# Patient Record
Sex: Female | Born: 1940
Health system: Southern US, Community
[De-identification: ages and names within clinical notes are randomized; demographics above are authoritative.]

## PROBLEM LIST (undated history)

## (undated) DIAGNOSIS — K5730 Diverticulosis of large intestine without perforation or abscess without bleeding: Secondary | ICD-10-CM

## (undated) DIAGNOSIS — M129 Arthropathy, unspecified: Secondary | ICD-10-CM

## (undated) DIAGNOSIS — E785 Hyperlipidemia, unspecified: Secondary | ICD-10-CM

## (undated) DIAGNOSIS — R768 Other specified abnormal immunological findings in serum: Secondary | ICD-10-CM

## (undated) DIAGNOSIS — T7840XA Allergy, unspecified, initial encounter: Secondary | ICD-10-CM

## (undated) DIAGNOSIS — M949 Disorder of cartilage, unspecified: Secondary | ICD-10-CM

## (undated) DIAGNOSIS — J309 Allergic rhinitis, unspecified: Secondary | ICD-10-CM

## (undated) DIAGNOSIS — H8109 Meniere's disease, unspecified ear: Secondary | ICD-10-CM

## (undated) DIAGNOSIS — B009 Herpesviral infection, unspecified: Secondary | ICD-10-CM

## (undated) DIAGNOSIS — E039 Hypothyroidism, unspecified: Secondary | ICD-10-CM

## (undated) DIAGNOSIS — M1990 Unspecified osteoarthritis, unspecified site: Secondary | ICD-10-CM

## (undated) DIAGNOSIS — M899 Disorder of bone, unspecified: Secondary | ICD-10-CM

## (undated) DIAGNOSIS — G43909 Migraine, unspecified, not intractable, without status migrainosus: Secondary | ICD-10-CM

## (undated) HISTORY — DX: Allergic rhinitis, unspecified: J30.9

## (undated) HISTORY — PX: WISDOM TOOTH EXTRACTION: SHX21

## (undated) HISTORY — DX: Hypothyroidism, unspecified: E03.9

## (undated) HISTORY — DX: Unspecified osteoarthritis, unspecified site: M19.90

## (undated) HISTORY — PX: COLONOSCOPY: SHX174

## (undated) HISTORY — PX: BUNIONECTOMY: SHX129

## (undated) HISTORY — DX: Other specified abnormal immunological findings in serum: R76.8

## (undated) HISTORY — DX: Meniere's disease, unspecified ear: H81.09

## (undated) HISTORY — DX: Disorder of bone, unspecified: M94.9

## (undated) HISTORY — DX: Arthropathy, unspecified: M12.9

## (undated) HISTORY — DX: Diverticulosis of large intestine without perforation or abscess without bleeding: K57.30

## (undated) HISTORY — DX: Herpesviral infection, unspecified: B00.9

## (undated) HISTORY — DX: Migraine, unspecified, not intractable, without status migrainosus: G43.909

## (undated) HISTORY — DX: Disorder of bone, unspecified: M89.9

## (undated) HISTORY — DX: Allergy, unspecified, initial encounter: T78.40XA

## (undated) HISTORY — DX: Hyperlipidemia, unspecified: E78.5

---
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
## 1998-05-09 ENCOUNTER — Other Ambulatory Visit: Admission: RE | Admit: 1998-05-09 | Discharge: 1998-05-09 | Payer: Self-pay | Admitting: Cardiology

## 1999-04-25 ENCOUNTER — Other Ambulatory Visit: Admission: RE | Admit: 1999-04-25 | Discharge: 1999-04-25 | Payer: Self-pay | Admitting: Cardiology

## 1999-08-13 ENCOUNTER — Encounter: Payer: Self-pay | Admitting: Cardiology

## 1999-08-13 ENCOUNTER — Encounter: Admission: RE | Admit: 1999-08-13 | Discharge: 1999-08-13 | Payer: Self-pay | Admitting: Cardiology

## 2000-06-11 ENCOUNTER — Other Ambulatory Visit: Admission: RE | Admit: 2000-06-11 | Discharge: 2000-06-11 | Payer: Self-pay | Admitting: Urology

## 2000-06-23 ENCOUNTER — Encounter: Payer: Self-pay | Admitting: *Deleted

## 2000-06-23 ENCOUNTER — Encounter: Admission: RE | Admit: 2000-06-23 | Discharge: 2000-06-23 | Payer: Self-pay | Admitting: *Deleted

## 2000-06-27 ENCOUNTER — Other Ambulatory Visit: Admission: RE | Admit: 2000-06-27 | Discharge: 2000-06-27 | Payer: Self-pay | Admitting: Obstetrics & Gynecology

## 2000-06-30 ENCOUNTER — Encounter (INDEPENDENT_AMBULATORY_CARE_PROVIDER_SITE_OTHER): Payer: Self-pay

## 2000-06-30 ENCOUNTER — Other Ambulatory Visit: Admission: RE | Admit: 2000-06-30 | Discharge: 2000-06-30 | Payer: Self-pay | Admitting: Obstetrics & Gynecology

## 2000-08-13 ENCOUNTER — Encounter: Payer: Self-pay | Admitting: *Deleted

## 2000-08-13 ENCOUNTER — Encounter: Admission: RE | Admit: 2000-08-13 | Discharge: 2000-08-13 | Payer: Self-pay | Admitting: *Deleted

## 2000-09-02 ENCOUNTER — Encounter: Payer: Self-pay | Admitting: Internal Medicine

## 2000-09-02 ENCOUNTER — Ambulatory Visit (HOSPITAL_COMMUNITY): Admission: RE | Admit: 2000-09-02 | Discharge: 2000-09-02 | Payer: Self-pay | Admitting: Internal Medicine

## 2000-10-15 ENCOUNTER — Other Ambulatory Visit: Admission: RE | Admit: 2000-10-15 | Discharge: 2000-10-15 | Payer: Self-pay | Admitting: Obstetrics & Gynecology

## 2001-03-03 ENCOUNTER — Encounter: Payer: Self-pay | Admitting: *Deleted

## 2001-03-03 ENCOUNTER — Encounter: Admission: RE | Admit: 2001-03-03 | Discharge: 2001-03-03 | Payer: Self-pay | Admitting: *Deleted

## 2001-07-15 ENCOUNTER — Other Ambulatory Visit: Admission: RE | Admit: 2001-07-15 | Discharge: 2001-07-15 | Payer: Self-pay | Admitting: Obstetrics & Gynecology

## 2001-08-17 ENCOUNTER — Encounter: Payer: Self-pay | Admitting: *Deleted

## 2001-08-17 ENCOUNTER — Encounter: Admission: RE | Admit: 2001-08-17 | Discharge: 2001-08-17 | Payer: Self-pay | Admitting: *Deleted

## 2002-07-20 ENCOUNTER — Other Ambulatory Visit: Admission: RE | Admit: 2002-07-20 | Discharge: 2002-07-20 | Payer: Self-pay | Admitting: Obstetrics & Gynecology

## 2002-09-08 ENCOUNTER — Encounter: Admission: RE | Admit: 2002-09-08 | Discharge: 2002-09-08 | Payer: Self-pay

## 2003-07-25 ENCOUNTER — Other Ambulatory Visit: Admission: RE | Admit: 2003-07-25 | Discharge: 2003-07-25 | Payer: Self-pay | Admitting: Obstetrics & Gynecology

## 2003-09-12 ENCOUNTER — Encounter: Admission: RE | Admit: 2003-09-12 | Discharge: 2003-09-12 | Payer: Self-pay | Admitting: Family Medicine

## 2004-09-13 ENCOUNTER — Encounter: Admission: RE | Admit: 2004-09-13 | Discharge: 2004-09-13 | Payer: Self-pay | Admitting: Family Medicine

## 2005-07-26 ENCOUNTER — Encounter: Admission: RE | Admit: 2005-07-26 | Discharge: 2005-07-26 | Payer: Self-pay | Admitting: Family Medicine

## 2005-07-30 ENCOUNTER — Ambulatory Visit: Payer: Self-pay | Admitting: Internal Medicine

## 2005-08-03 ENCOUNTER — Encounter: Admission: RE | Admit: 2005-08-03 | Discharge: 2005-08-03 | Payer: Self-pay | Admitting: Family Medicine

## 2005-08-09 ENCOUNTER — Ambulatory Visit: Payer: Self-pay | Admitting: Internal Medicine

## 2005-10-08 ENCOUNTER — Encounter: Admission: RE | Admit: 2005-10-08 | Discharge: 2005-10-08 | Payer: Self-pay | Admitting: Obstetrics & Gynecology

## 2006-10-10 ENCOUNTER — Encounter: Admission: RE | Admit: 2006-10-10 | Discharge: 2006-10-10 | Payer: Self-pay | Admitting: Family Medicine

## 2006-12-11 ENCOUNTER — Ambulatory Visit: Payer: Self-pay | Admitting: *Deleted

## 2007-02-22 ENCOUNTER — Encounter: Admission: RE | Admit: 2007-02-22 | Discharge: 2007-02-22 | Payer: Self-pay | Admitting: Otolaryngology

## 2007-03-27 ENCOUNTER — Encounter: Admission: RE | Admit: 2007-03-27 | Discharge: 2007-06-25 | Payer: Self-pay | Admitting: Otolaryngology

## 2007-10-14 ENCOUNTER — Encounter: Admission: RE | Admit: 2007-10-14 | Discharge: 2007-10-14 | Payer: Self-pay | Admitting: Family Medicine

## 2008-09-07 ENCOUNTER — Encounter: Payer: Self-pay | Admitting: Internal Medicine

## 2008-09-07 LAB — CONVERTED CEMR LAB
ALT: 18 units/L
AST: 22 units/L
BUN: 21 mg/dL
CO2: 29 meq/L
Chloride: 101 meq/L
Cholesterol: 165 mg/dL
HCT: 40.4 %
HDL: 61 mg/dL
Hemoglobin: 13.6 g/dL
LDL Cholesterol: 93 mg/dL
MCV: 93 fL
Monocytes Relative: 13 %
Potassium: 3.9 meq/L
RBC: 4.33 M/uL
RDW: 12.9 %
Total Bilirubin: 0.4 mg/dL
Triglyceride fasting, serum: 56 mg/dL

## 2008-09-14 ENCOUNTER — Encounter: Payer: Self-pay | Admitting: Internal Medicine

## 2008-10-06 HISTORY — PX: OTHER SURGICAL HISTORY: SHX169

## 2008-10-13 ENCOUNTER — Ambulatory Visit (HOSPITAL_COMMUNITY): Admission: RE | Admit: 2008-10-13 | Discharge: 2008-10-13 | Payer: Self-pay | Admitting: Otolaryngology

## 2008-10-20 ENCOUNTER — Encounter: Admission: RE | Admit: 2008-10-20 | Discharge: 2008-10-20 | Payer: Self-pay | Admitting: Family Medicine

## 2008-12-15 ENCOUNTER — Encounter: Payer: Self-pay | Admitting: Internal Medicine

## 2008-12-15 LAB — CONVERTED CEMR LAB: TSH: 4.27 microintl units/mL

## 2009-08-22 ENCOUNTER — Encounter: Admission: RE | Admit: 2009-08-22 | Discharge: 2009-08-22 | Payer: Self-pay | Admitting: Family Medicine

## 2009-09-11 ENCOUNTER — Encounter: Payer: Self-pay | Admitting: Internal Medicine

## 2009-09-11 LAB — CONVERTED CEMR LAB
AST: 26 units/L
Alkaline Phosphatase: 58 units/L
BUN: 17 mg/dL
Calcium: 9.8 mg/dL
Chloride: 101 meq/L
Cholesterol: 205 mg/dL
Creatinine, Ser: 0.9 mg/dL
HCT: 41.9 %
MCV: 92 fL
Monocytes Relative: 7 %
Neutrophils Relative %: 54 %
Potassium: 3.9 meq/L
RDW: 13.1 %
TSH: 5.89 microintl units/mL
Total Bilirubin: 0.5 mg/dL

## 2009-10-23 ENCOUNTER — Encounter: Admission: RE | Admit: 2009-10-23 | Discharge: 2009-10-23 | Payer: Self-pay | Admitting: Family Medicine

## 2010-01-11 ENCOUNTER — Encounter: Payer: Self-pay | Admitting: Internal Medicine

## 2010-01-11 LAB — CONVERTED CEMR LAB
ALT: 25 units/L
Alkaline Phosphatase: 55 units/L
BUN: 23 mg/dL
Chloride: 97 meq/L
Creatinine, Ser: 1 mg/dL
Glucose, Bld: 92 mg/dL
HDL: 69 mg/dL
TSH: 5.28 microintl units/mL
Total Bilirubin: 0.7 mg/dL
Total Protein: 7.1 g/dL
Triglyceride fasting, serum: 89 mg/dL

## 2010-07-05 ENCOUNTER — Encounter: Payer: Self-pay | Admitting: Internal Medicine

## 2010-07-05 DIAGNOSIS — J309 Allergic rhinitis, unspecified: Secondary | ICD-10-CM | POA: Insufficient documentation

## 2010-07-05 DIAGNOSIS — M8580 Other specified disorders of bone density and structure, unspecified site: Secondary | ICD-10-CM

## 2010-07-05 DIAGNOSIS — T148XXA Other injury of unspecified body region, initial encounter: Secondary | ICD-10-CM | POA: Insufficient documentation

## 2010-07-05 DIAGNOSIS — K5730 Diverticulosis of large intestine without perforation or abscess without bleeding: Secondary | ICD-10-CM | POA: Insufficient documentation

## 2010-07-05 DIAGNOSIS — M810 Age-related osteoporosis without current pathological fracture: Secondary | ICD-10-CM | POA: Insufficient documentation

## 2010-07-26 ENCOUNTER — Ambulatory Visit
Admission: RE | Admit: 2010-07-26 | Discharge: 2010-07-26 | Payer: Self-pay | Source: Home / Self Care | Attending: Internal Medicine | Admitting: Internal Medicine

## 2010-07-26 ENCOUNTER — Encounter: Payer: Self-pay | Admitting: Internal Medicine

## 2010-07-26 DIAGNOSIS — H8109 Meniere's disease, unspecified ear: Secondary | ICD-10-CM | POA: Insufficient documentation

## 2010-07-26 DIAGNOSIS — E785 Hyperlipidemia, unspecified: Secondary | ICD-10-CM | POA: Insufficient documentation

## 2010-07-26 DIAGNOSIS — G43909 Migraine, unspecified, not intractable, without status migrainosus: Secondary | ICD-10-CM | POA: Insufficient documentation

## 2010-07-26 DIAGNOSIS — M129 Arthropathy, unspecified: Secondary | ICD-10-CM | POA: Insufficient documentation

## 2010-07-26 DIAGNOSIS — E039 Hypothyroidism, unspecified: Secondary | ICD-10-CM | POA: Insufficient documentation

## 2010-08-03 ENCOUNTER — Encounter: Payer: Self-pay | Admitting: Internal Medicine

## 2010-08-09 NOTE — Progress Notes (Signed)
Summary: Summary from patient  Summary from patient   Imported By: Lester [place] 07/27/2010 10:35:48  _____________________________________________________________________  External Attachment:    Type:   Image     Comment:   External Document

## 2010-08-09 NOTE — Letter (Signed)
Summary: Ursula Beath MD/[facility]  Ursula Beath MD/[facility]   Imported By: Lester [place] 07/27/2010 10:34:21  _____________________________________________________________________  External Attachment:    Type:   Image     Comment:   External Document

## 2010-08-09 NOTE — Letter (Signed)
Summary: Colonoscopy Letter  [facility]  [address] [place], [place] [address]   Phone: [phone]  Fax: [phone]      August 03, 2010 MRN: [id number]   ALYANNA STOERMER [address] [place], [place]  [address]   Dear Ms. Depuy,   According to your medical record, it is time for you to schedule a Colonoscopy. The American Cancer Society recommends this procedure as a method to detect early colon cancer. Patients with a family history of colon cancer, or a personal history of colon polyps or inflammatory bowel disease are at increased risk.  This letter has been generated based on the recommendations made at the time of your procedure. If you feel that in your particular situation this may no longer apply, please contact our office.  Please call our office at [phone] to schedule this appointment or to update your records at your earliest convenience.  Thank you for cooperating with [place] to provide you with the very best care possible.   Sincerely,  Wilhemina Bonito. Marina Goodell, M.D.  [facility] Gastroenterology Division [phone]

## 2010-08-09 NOTE — Assessment & Plan Note (Signed)
Summary: NEW MEDICARE PT--#--PKG---STC   Vital Signs:  Patient profile:   70 year old female Height:      62 inches (157.48 cm) Weight:      121.0 pounds (55.00 kg) BMI:     22.21 O2 Sat:      95 % on Room air Temp:     97.9 degrees F (36.61 degrees C) oral Pulse rate:   73 / minute BP sitting:   110 / 72  (left arm) Cuff size:   regular  Vitals Entered By: Orlan Leavens RMA (July 26, 2010 8:13 AM)  O2 Flow:  Room air CC: New patient Is Patient Diabetic? No Pain Assessment Patient in pain? no        Primary Care Provider:  Newt Lukes, MD  CC:  New patient.  History of Present Illness: new pt to me and our practice, here to est care - prev with [facility]bull/mozzachi  reviewed chronic med issues: meniere's dz - dx 11/2006 - multiple interventions (gentamycin injections and cochlear implant) and PT - follows with ENT and neuro for same. remains on diuretic tx for same and gabapentin for "pressure" - no recent flares  osteopenia - last dexa 10/2009 with [facility] - no med tx beyong calcium recomended - weight bearing exercises ongoing -   hypothyroid - reports compliance with ongoing medical treatment and no changes in medication dose or frequency. denies adverse side effects related to current therapy. no weight, skin or bowel changes  Preventive Screening-Counseling & Management  Alcohol-Tobacco     Alcohol drinks/day: 0     Alcohol Counseling: not indicated; patient does not drink     Smoking Status: never     Tobacco Counseling: not indicated; no tobacco use  Caffeine-Diet-Exercise     Does Patient Exercise: yes     Exercise Counseling: not indicated; exercise is adequate     Depression Counseling: not indicated; screening negative for depression  Safety-Violence-Falls     Seat Belt Counseling: not indicated; patient wears seat belts     Helmet Counseling: not applicable     Firearm Counseling: not indicated; uses recommended firearm safety measures  Violence Counseling: not indicated; no violence risk noted     Fall Risk Counseling: not indicated; no significant falls noted  Clinical Review Panels:  Immunizations   Last Tetanus Booster:  Historical (06/19/2003)   Last Pneumovax:  Historical (09/05/2009)   Last Zoster Vaccine:  Zostavax (07/08/2005)  Lipid Management   Cholesterol:  206 (01/11/2010)   LDL (bad choesterol):  119 (01/11/2010)   HDL (good cholesterol):  69 (01/11/2010)   Triglycerides:  89 (01/11/2010)  CBC   WBC:  5.4 (09/11/2009)   RBC:  4.58 (09/11/2009)   Hgb:  14.1 (09/11/2009)   Hct:  41.9 (09/11/2009)   Platelets:  257 (09/11/2009)   MCV  92 (09/11/2009)   RDW  13.1 (09/11/2009)   PMN:  54 (09/11/2009)   Monos:  7 (09/11/2009)   Eosinophils:  1 (09/11/2009)   Basophil:  1 (09/11/2009)  Complete Metabolic Panel   Glucose:  92 (01/11/2010)   Sodium:  139 (01/11/2010)   Potassium:  3.9 (01/11/2010)   Chloride:  97 (01/11/2010)   CO2:  30 (01/11/2010)   BUN:  23 (01/11/2010)   Creatinine:  1.00 (01/11/2010)   Albumin:  4.3 (01/11/2010)   Total Protein:  7.1 (01/11/2010)   Calcium:  9.7 (01/11/2010)   Total Bili:  0.7 (01/11/2010)   Alk Phos:  55 (01/11/2010)  SGPT (ALT):  25 (01/11/2010)   SGOT (AST):  26 (01/11/2010)   Current Medications (verified): 1)  Triamterene-Hctz 37.5-25 Mg Tabs (Triamterene-Hctz) .... Take 1 By Mouth Once Daily 2)  Gabapentin 600 Mg Tabs (Gabapentin) .... Take 1 Three Times A Day 3)  Calcium 600 Mg Tabs (Calcium) .... Take 1 By Mouth Once Daily 4)  Glucosamine-Chondroitin 250-200 Mg Caps (Glucosamine-Chondroitin) .... Take 2 By Mouth Once Daily 5)  Multivitamins  Caps (Hair & Skin) .... Take 1 By Mouth Once Daily 6)  Synthroid 50 Mcg Tabs (Levothyroxine Sodium) .... Take 1 By Mouth Once Daily  Allergies (verified): No Known Drug Allergies  Past History:  Past Medical History: Allergic rhinitis Diverticulosis,  colon Hypothyroidism Osteopenia Hyperlipidemia, mild - no med rx  MD roster: gyn - [facility] - te[facility] derm- carol [facility]cconnell neuro/ha - [facility] - perry  Past Surgical History: Tonsillectomy (1954) Bunion surgery right 08/2002, and left 09/2002 Hearing loss in (R) ear implants (10/2008)  Family History: Family History of Arthritis (mom) Family History Breast cancer 1st degree relative <50 (sister) Family History of Colon CA 1st degree relative <60 (father) Family History Diabetes 1st degree relative (mom, sister,brother) Family History Lung cancer (sister) Family History of Prostate CA 1st degree relative <50 (brother) Heart disease (mom) Stroke (dad) Brain tumor, not cancerous (sister) Brain cancer (nephew)  mom expired age 24y dad expired age 8y - colon ca 25y  Social History: Never Smoked no alcohol mrried, lives with spouse - 2 sons and 4 g-kids nearby 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 I have reviewed all other systems and they were negative.   Physical Exam  General:  alert, well-developed, well-nourished, and cooperative to examination.    Head:  Normocephalic and atraumatic without obvious abnormalities. No apparent alopecia or balding. Ears:  cochlear implant on scalp right side and hearing aide on L - normal pinnae bilaterally, without erythema, swelling, or tenderness to palpation. TMs clear, without effusion, or cerumen impaction. Mouth:  teeth and gums in good repair; mucous membranes moist, without lesions or ulcers. oropharynx clear without exudate, no erythema.  Neck:  supple, full ROM, no masses, no thyromegaly; no thyroid nodules or tenderness. no JVD or carotid bruits.   Lungs:  normal respiratory effort, no intercostal retractions or use of accessory muscles; normal breath sounds bilaterally - no crackles and no wheezes.    Heart:  normal rate,  regular rhythm, no murmur, and no rub. BLE without edema. normal DP pulses and normal cap refill in all 4 extremities    Msk:  No deformity or scoliosis noted of thoracic or lumbar spine.   Neurologic:  alert & oriented X3 and cranial nerves II-XII symetrically intact.  strength normal in all extremities, sensation intact to light touch, and gait normal. speech fluent without dysarthria or aphasia; follows commands with good comprehension.  Skin:  no rashes, vesicles, ulcers, or erythema. No nodules or irregularity to palpation.  Psych:  Oriented X3, memory intact for recent and remote, normally interactive, good eye contact, not anxious appearing, not depressed appearing, and not agitated.      Impression & Recommendations:  Problem # 1:  MENIERE'S DISEASE (ICD-386.00) inactive at this time but hx severe dz - hx reviewed in depth today s/p gentomycin injections and cohchlear implant -  follows with ENT and neuro for same - cont diuretic  and gabapentin for symptoms mgmt  Problem # 2:  HYPOTHYROIDISM (ICD-244.9)  Her updated medication list for this problem includes:    Synthroid 50 Mcg Tabs (Levothyroxine sodium) .Marland Kitchen... Take 1 by mouth once daily  Labs Reviewed: TSH: 5.280 (01/11/2010)    Chol: 206 (01/11/2010)   HDL: 69 (01/11/2010)   LDL: 119 (01/11/2010)   TG: 89 (01/11/2010)  Problem # 3:  HYPERLIPIDEMIA (ICD-272.4) mild  - no rx tx - diet and exercise  Labs Reviewed: SGOT: 26 (01/11/2010)   SGPT: 25 (01/11/2010)   HDL:69 (01/11/2010), 69 (09/11/2009)  LDL:119 (01/11/2010), 93 (11/91/4782)  Chol:206 (01/11/2010), 205 (09/11/2009)  Trig:89 (01/11/2010), 84 (09/11/2009)  Problem # 4:  OSTEOPENIA (ICD-733.90) dexa 10/2009 - cont ca and weight bearing exercise  Complete Medication List: 1)  Triamterene-hctz 37.5-25 Mg Tabs (Triamterene-hctz) .... Take 1 by mouth once daily 2)  Gabapentin 600 Mg Tabs (Gabapentin) .... Take 1 three times a day 3)  Calcium 600 Mg Tabs (Calcium) ....  Take 1 by mouth once daily 4)  Glucosamine-chondroitin 250-200 Mg Caps (Glucosamine-chondroitin) .... Take 2 by mouth once daily 5)  Multivitamins Caps (hair & Skin)  .... Take 1 by mouth once daily 6)  Synthroid 50 Mcg Tabs (Levothyroxine sodium) .... Take 1 by mouth once daily  Patient Instructions: 1)  it was good to see you today. 2)  medical history and medications reviewed - no changes recommended today 3)  Please schedule a follow-up appointment in 3 months for "physical" and labs (thyroid, cholesterol), call sooner if problems.    Orders Added: 1)  New Patient Level III [99203]   Immunization History:  Pneumovax Immunization History:    Pneumovax:  historical (09/05/2009)   Immunization History:  Pneumovax Immunization History:    Pneumovax:  Historical (09/05/2009)

## 2010-08-15 ENCOUNTER — Encounter (INDEPENDENT_AMBULATORY_CARE_PROVIDER_SITE_OTHER): Payer: Self-pay | Admitting: *Deleted

## 2010-08-23 NOTE — Letter (Signed)
Summary: Pre Visit Letter Revised  [facility]  [address] [place], [place] [address]   Phone: [phone]  Fax: [phone]        08/15/2010 MRN: [id number] Holly Richard [address] [place], [place]  [address]             Procedure Date:  09-12-10   Welcome to the Gastroenterology Division at [facility].    You are scheduled to see a nurse for your pre-procedure visit on 08-29-10 at 10:30A.M. on the 3rd floor at [facility], [address]  We ask that you try to arrive at our office 15 minutes prior to your appointment time to allow for check-in.  Please take a minute to review the attached form.  If you answer "Yes" to one or more of the questions on the first page, we ask that you call the person listed at your earliest opportunity.  If you answer "No" to all of the questions, please complete the rest of the form and bring it to your appointment.    Your nurse visit will consist of discussing your medical and surgical history, your immediate family medical history, and your medications.   If you are unable to list all of your medications on the form, please bring the medication bottles to your appointment and we will list them.  We will need to be aware of both prescribed and over the counter drugs.  We will need to know exact dosage information as well.    Please be prepared to read and sign documents such as consent forms, a financial agreement, and acknowledgement forms.  If necessary, and with your consent, a friend or relative is welcome to sit-in on the nurse visit with you.  Please bring your insurance card so that we may make a copy of it.  If your insurance requires a referral to see a specialist, please bring your referral form from your primary care physician.  No co-pay is required for this nurse visit.     If you cannot keep your appointment, please call [phone] to cancel or reschedule prior to your appointment date.  This  allows [place] the opportunity to schedule an appointment for another patient in need of care.    Thank you for choosing  Gastroenterology for your medical needs.  We appreciate the opportunity to care for you.  Please visit [place] at our website  to learn more about our practice.  Sincerely, The Gastroenterology Division

## 2010-08-28 ENCOUNTER — Encounter (INDEPENDENT_AMBULATORY_CARE_PROVIDER_SITE_OTHER): Payer: Self-pay | Admitting: *Deleted

## 2010-08-29 ENCOUNTER — Encounter: Payer: Self-pay | Admitting: Internal Medicine

## 2010-09-04 NOTE — Miscellaneous (Signed)
Summary: [facility] Previsit/prep  Clinical Lists Changes  Medications: Added new medication of MOVIPREP 100 GM  SOLR (PEG-KCL-NACL-NASULF-NA ASC-C) As per prep instructions. - Signed Rx of MOVIPREP 100 GM  SOLR (PEG-KCL-NACL-NASULF-NA ASC-C) As per prep instructions.;  #1 x 0;  Signed;  Entered by: Wyona Almas RN;  Authorized by: Hilarie Fredrickson MD;  Method used: Electronically to [facility] [address], [place], [place], [place]  [address], Ph: [phone], Fax: [phone] Observations: Added new observation of NKA: T (08/29/2010 10:14)    Prescriptions: MOVIPREP 100 GM  SOLR (PEG-KCL-NACL-NASULF-NA ASC-C) As per prep instructions.  #1 x 0   Entered by:   Wyona Almas RN   Authorized by:   Hilarie Fredrickson MD   Signed by:   Wyona Almas RN on 08/29/2010   Method used:   Electronically to        [facility] (retail)       [address]       [place], [place]  [address]       Ph: [phone]       Fax: [phone]   RxID:   [id number]

## 2010-09-04 NOTE — Letter (Signed)
Summary: [facility] Instructions  [facility]  [address] [place], [place] [address]   Phone: [phone]  Fax: [phone]       Holly Richard    1941/03/21    MRN: [id number]        Procedure Day Dorna Bloom:  [facility]  09/12/10     Arrival Time:  7:30AM      Procedure Time:  8:00AM     Location of Procedure:                    [name]  [facility] (4th Floor)                      PREPARATION FOR COLONOSCOPY WITH MOVIPREP   Starting 5 days prior to your procedure 09/07/10 do not eat nuts, seeds, popcorn, corn, beans, peas,  salads, or any raw vegetables.  Do not take any fiber supplements (e.g. Metamucil, Citrucel, and Benefiber).  THE DAY BEFORE YOUR PROCEDURE         DATE: 09/11/10  DAY: TUESDAY  1.  Drink clear liquids the entire day-NO SOLID FOOD  2.  Do not drink anything colored red or purple.  Avoid juices with pulp.  No orange juice.  3.  Drink at least 64 oz. (8 glasses) of fluid/clear liquids during the day to prevent dehydration and help the prep work efficiently.  CLEAR LIQUIDS INCLUDE: Water Jello Ice Popsicles Tea (sugar ok, no milk/cream) Powdered fruit flavored drinks Coffee (sugar ok, no milk/cream) Gatorade Juice: apple, white grape, white cranberry  Lemonade Clear bullion, consomm, broth Carbonated beverages (any kind) Strained chicken noodle soup Hard Candy                             4.  In the morning, mix first dose of MoviPrep solution:    Empty 1 Pouch A and 1 Pouch B into the disposable container    Add lukewarm drinking water to the top line of the container. Mix to dissolve    Refrigerate (mixed solution should be used within 24 hrs)  5.  Begin drinking the prep at 5:00 p.m. The MoviPrep container is divided by 4 marks.   Every 15 minutes drink the solution down to the next mark (approximately 8 oz) until the full liter is complete.   6.  Follow completed prep with 16 oz of clear liquid of your choice (Nothing  red or purple).  Continue to drink clear liquids until bedtime.  7.  Before going to bed, mix second dose of MoviPrep solution:    Empty 1 Pouch A and 1 Pouch B into the disposable container    Add lukewarm drinking water to the top line of the container. Mix to dissolve    Refrigerate  THE DAY OF YOUR PROCEDURE      DATE: 09/12/10   DAY: WEDNESDAY  Beginning at 3:00AM (5 hours before procedure):         1. Every 15 minutes, drink the solution down to the next mark (approx 8 oz) until the full liter is complete.  2. Follow completed prep with 16 oz. of clear liquid of your choice.    3. You may drink clear liquids until 6:00AM (2 HOURS BEFORE PROCEDURE).   MEDICATION INSTRUCTIONS  Unless otherwise instructed, you should take regular prescription medications with a small sip of water   as early as possible the morning  of your procedure.   Additional medication instructions:  Hold Triamterene/HCTZ the morning of procedure.         OTHER INSTRUCTIONS  You will need a responsible adult at least 68 years of age to accompany you and drive you home.   This person must remain in the waiting room during your procedure.  Wear loose fitting clothing that is easily removed.  Leave jewelry and other valuables at home.  However, you may wish to bring a book to read or  an iPod/MP3 player to listen to music as you wait for your procedure to start.  Remove all body piercing jewelry and leave at home.  Total time from sign-in until discharge is approximately 2-3 hours.  You should go home directly after your procedure and rest.  You can resume normal activities the  day after your procedure.  The day of your procedure you should not:   Drive   Make legal decisions   Operate machinery   Drink alcohol   Return to work  You will receive specific instructions about eating, activities and medications before you leave.    The above instructions have been reviewed and  explained to me by   Wyona Almas RN  August 29, 2010 10:59 AM     I fully understand and can verbalize these instructions _____________________________ Date _________

## 2010-09-12 ENCOUNTER — Other Ambulatory Visit (AMBULATORY_SURGERY_CENTER): Payer: Medicare Other | Admitting: Internal Medicine

## 2010-09-12 ENCOUNTER — Encounter: Payer: Self-pay | Admitting: Internal Medicine

## 2010-09-12 DIAGNOSIS — K648 Other hemorrhoids: Secondary | ICD-10-CM

## 2010-09-12 DIAGNOSIS — Z800 Family history of malignant neoplasm of digestive organs: Secondary | ICD-10-CM

## 2010-09-12 DIAGNOSIS — Z1211 Encounter for screening for malignant neoplasm of colon: Secondary | ICD-10-CM

## 2010-09-18 NOTE — Procedures (Signed)
Summary: Colonoscopy  Patient: Holly Richard Note: All result statuses are Final unless otherwise noted.  Tests: (1) Colonoscopy (COL)   COL Colonoscopy           DONE     [facility]     [address]     [place], [place]  [address]          COLONOSCOPY PROCEDURE REPORT          PATIENT:  Holly Richard, Holly Richard  MR#:  [id number]     BIRTHDATE:  Jan 16, 1941, 69 yrs. old  GENDER:  female     ENDOSCOPIST:  Wilhemina Bonito. Eda Keys, MD     REF. BY:  Screening Recall     PROCEDURE DATE:  09/12/2010     PROCEDURE:  Higher-risk screening colonoscopy G0105          ASA CLASS:  Class II     INDICATIONS:  Elevated Risk Screening, family history of colon     cancer ; parent     MEDICATIONS:   Fentanyl 100 mcg IV, Versed 10 mg IV          DESCRIPTION OF PROCEDURE:   After the risks benefits and     alternatives of the procedure were thoroughly explained, informed     consent was obtained.  Digital rectal exam was performed and     revealed no abnormalities.   The LB 180AL [id number] endoscope was     introduced through the anus and advanced to the cecum, which was     identified by both the appendix and ileocecal valve, without     limitations.Time to cecum = 6:59  min.  The quality of the prep     was excellent, using MoviPrep.  The instrument was then slowly     withdrawn (time = 8:53 min) as the colon was fully examined.     <<PROCEDUREIMAGES>>          FINDINGS:  Fixed rectosigmoid junction, presumably due to     adhesions, somewhat difficult to traverse.A normal appearing     cecum, ileocecal valve, and appendiceal orifice were identified.     The ascending, hepatic flexure, transverse, splenic flexure,     descending, sigmoid colon, and rectum appeared unremarkable.  No     polyps or cancers were seen.   Retroflexed views in the rectum     revealed internal hemorrhoids.    The scope was then withdrawn     from the patient and the procedure completed.          COMPLICATIONS:   None          ENDOSCOPIC IMPRESSION:     1) Normal colon     2) No polyps or cancers     3) Internal hemorrhoids          RECOMMENDATIONS:     1) Follow up colonoscopy in 5 years (fam hx)          ______________________________     Wilhemina Bonito. Eda Keys, MD          CC:  Newt Lukes, MD; The Patient          n.     eSIGNED:   Wilhemina Bonito. Eda Keys at 09/12/2010 08:58 AM          Duane Lope, [id number]  Note: An exclamation mark (!) indicates a result that was not dispersed into the flowsheet. Document Creation Date: 09/12/2010 8:58 AM _______________________________________________________________________  Holly Richard  1) Order result status: Final Collection or observation date-time: 09/12/2010 08:50 Requested date-time:  Receipt date-time:  Reported date-time:  Referring Physician:   Ordering Physician: Fransico Setters [id number]) Specimen Source:  Source: Launa Grill Order Number: [id number] Lab site:   Appended Document: Colonoscopy    Clinical Lists Changes  Observations: Added new observation of COLONNXTDUE: 09/2015 (09/12/2010 12:00)

## 2010-09-26 ENCOUNTER — Encounter: Payer: Self-pay | Admitting: *Deleted

## 2010-09-28 ENCOUNTER — Other Ambulatory Visit: Payer: Self-pay | Admitting: Internal Medicine

## 2010-09-28 DIAGNOSIS — Z1231 Encounter for screening mammogram for malignant neoplasm of breast: Secondary | ICD-10-CM

## 2010-10-17 LAB — CBC
Hemoglobin: 14.2 g/dL (ref 12.0–15.0)
MCHC: 34.4 g/dL (ref 30.0–36.0)
MCV: 94.3 fL (ref 78.0–100.0)
RBC: 4.39 MIL/uL (ref 3.87–5.11)
WBC: 6.4 10*3/uL (ref 4.0–10.5)

## 2010-10-29 ENCOUNTER — Ambulatory Visit
Admission: RE | Admit: 2010-10-29 | Discharge: 2010-10-29 | Disposition: A | Discharge status: Home / Self Care | Payer: Medicare Other | Source: Ambulatory Visit | Attending: Internal Medicine | Admitting: Internal Medicine

## 2010-10-29 DIAGNOSIS — Z1231 Encounter for screening mammogram for malignant neoplasm of breast: Secondary | ICD-10-CM

## 2010-11-01 ENCOUNTER — Encounter: Payer: Self-pay | Admitting: Internal Medicine

## 2010-11-01 ENCOUNTER — Other Ambulatory Visit (INDEPENDENT_AMBULATORY_CARE_PROVIDER_SITE_OTHER): Payer: Medicare Other

## 2010-11-01 ENCOUNTER — Ambulatory Visit (INDEPENDENT_AMBULATORY_CARE_PROVIDER_SITE_OTHER): Payer: Medicare Other | Admitting: Internal Medicine

## 2010-11-01 ENCOUNTER — Telehealth: Payer: Self-pay | Admitting: Internal Medicine

## 2010-11-01 VITALS — BP 110/72 | HR 74 | Temp 97.4°F | Ht 62.0 in | Wt 119.1 lb

## 2010-11-01 DIAGNOSIS — E039 Hypothyroidism, unspecified: Secondary | ICD-10-CM

## 2010-11-01 DIAGNOSIS — M949 Disorder of cartilage, unspecified: Secondary | ICD-10-CM

## 2010-11-01 DIAGNOSIS — H8109 Meniere's disease, unspecified ear: Secondary | ICD-10-CM

## 2010-11-01 DIAGNOSIS — E785 Hyperlipidemia, unspecified: Secondary | ICD-10-CM

## 2010-11-01 DIAGNOSIS — Z0000 Encounter for general adult medical examination without abnormal findings: Secondary | ICD-10-CM

## 2010-11-01 DIAGNOSIS — Z136 Encounter for screening for cardiovascular disorders: Secondary | ICD-10-CM

## 2010-11-01 LAB — LIPID PANEL
Cholesterol: 193 mg/dL (ref 0–200)
LDL Cholesterol: 115 mg/dL — ABNORMAL HIGH (ref 0–99)
Total CHOL/HDL Ratio: 3

## 2010-11-01 NOTE — Patient Instructions (Signed)
It was good to see you today. Medications reviewed, no changes at this time. We have reviewed your prior records including labs and tests today Test(s) ordered today. Your results will be called to you after review (48-72hours after test completion). If any changes need to be made, you will be notified at that time. Your preventative service have been reviewed and are up to date - remember to get your flu shot each fall! Please schedule followup in 12 months, call sooner if problems.

## 2010-11-01 NOTE — Assessment & Plan Note (Signed)
The current medical regimen is effective;  continue present plan and medications. Check tsh today Lab Results  Component Value Date   TSH 5.280 01/11/2010

## 2010-11-01 NOTE — Telephone Encounter (Signed)
Pt Notified with lab results  

## 2010-11-01 NOTE — Telephone Encounter (Signed)
Please call patient - normal cholesterol and thyroid results. No medication changes recommended. Thanks.  Lab Results  Component Value Date   WBC 5.4 09/11/2009   HGB 14.1 09/11/2009   HCT 41.9 09/11/2009   PLT 257 09/11/2009   CHOL 193 11/01/2010   TRIG 72.0 11/01/2010   HDL 64.00 11/01/2010   ALT 25 01/11/2010   AST 26 01/11/2010   NA 139 01/11/2010   K 3.9 01/11/2010   CL 97 01/11/2010   CREATININE 1.00 01/11/2010   BUN 23 01/11/2010   CO2 30 01/11/2010   TSH 0.77 11/01/2010

## 2010-11-01 NOTE — Progress Notes (Signed)
Subjective:    Patient ID: Holly Richard, female    DOB: 03-Aug-1940, 70 y.o.   MRN: [id number]  HPI  Here for medicare wellness  Diet: heart healthy or DM if diabetic Physical activity: sedentary Depression/mood screen: negative Hearing: intact to whispered voice on left side with hearing aide Visual acuity: grossly normal, performs annual eye exam  ADLs: capable Fall risk: none Home safety: good Cognitive evaluation: intact to orientation, naming, recall and repetition EOL planning: adv directives, full code/ I agree  I have personally reviewed and have noted 1. The patient's medical and social history 2. Their use of alcohol, tobacco or illicit drugs 3. Their current medications and supplements 4. The patient's functional ability including ADL's, fall risks, home safety risks and hearing or visual impairment. 5. Diet and physical activities 6. Evidence for depression or mood disorders  Also reviewed chronic med issues: meniere's dz - dx 11/2006 - multiple interventions (gentamycin injections and cochlear implant on R) and PT - follows with ENT and neuro for same. remains on diuretic tx for same and gabapentin for "pressure" - no recent flares  osteopenia - last dexa 10/2009 with [facility] - no med tx beyond calcium + vit d recomended - weight bearing exercises ongoing -   hypothyroid - reports compliance with ongoing medical treatment and no changes in medication dose or frequency. denies adverse side effects related to current therapy. no weight, skin or bowel changes  Past Medical History  Diagnosis Date  . MIGRAINE HEADACHE   . MENIERE'S DISEASE   . DIVERTICULOSIS, COLON   . ARTHRITIS   . OSTEOPENIA   . ALLERGIC RHINITIS   . HYPERLIPIDEMIA   . HYPOTHYROIDISM    Family History  Problem Relation Age of Onset  . Arthritis Mother   . Diabetes Mother   . Heart disease Mother   . Colon cancer Father 45  . Stroke Father   . Breast cancer Sister   . Diabetes  Sister   . Lung cancer Sister   . Diabetes Brother   . Prostate cancer Brother   . Brain cancer Other     Nephew   History  Substance Use Topics  . Smoking status: Never Smoker   . Smokeless tobacco: Not on file   Comment: Msrried, lives with spouse-2 sons and 4 g-kids nearby. 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 Alcohol Use:     Review of Systems  Constitutional: Negative for fever.  Respiratory: Negative for cough and shortness of breath.   Cardiovascular: Negative for chest pain.  Gastrointestinal: Negative for abdominal pain.  Musculoskeletal: Negative for gait problem.  Skin: Negative for rash.  Neurological: Negative for dizziness.  No other specific complaints in a complete review of systems (except as listed in HPI above).     Objective:   Physical Exam BP 110/72  Pulse 74  Temp(Src) 97.4 F (36.3 C) (Oral)  Ht 5\' 2"  (1.575 m)  Wt 119 lb 1.9 oz (54.032 kg)  BMI 21.79 kg/m2  SpO2 97% Physical Exam  Constitutional: She is oriented to person, place, and time. She appears well-developed and well-nourished. No distress.  HENT: Head: Normocephalic and atraumatic. Nose: Nose normal. Mouth/Throat: Oropharynx is clear and moist. No oropharyngeal exudate.  Eyes: Conjunctivae and EOM are normal. Pupils are equal, round, and reactive to light. No scleral icterus.  Neck: Normal range of motion. Neck supple. No JVD present. No thyromegaly present.  Cardiovascular: Normal rate, regular rhythm and normal heart sounds.  No murmur heard. Pulmonary/Chest: Effort normal and breath sounds normal. No respiratory distress. She has no wheezes.  Abdominal: Soft. Bowel sounds are normal. She exhibits no distension. There is no tenderness.  Musculoskeletal: Normal range of motion. She exhibits no edema.  Neurological: She is alert and oriented to person, place, and time. No cranial nerve deficit. Coordination normal.  Skin: Skin is warm and dry. No rash noted. No erythema.    Psychiatric: She has a normal mood and affect. Her behavior is normal. Judgment and thought content normal.   Lab Results  Component Value Date   WBC 5.4 09/11/2009   HGB 14.1 09/11/2009   HCT 41.9 09/11/2009   PLT 257 09/11/2009   CHOL 206 01/11/2010   HDL 69 01/11/2010   ALT 25 01/11/2010   AST 26 01/11/2010   NA 139 01/11/2010   K 3.9 01/11/2010   CL 97 01/11/2010   CREATININE 1.00 01/11/2010   BUN 23 01/11/2010   CO2 30 01/11/2010   TSH 5.280 01/11/2010   EKG today: NSR, no ischemic change or arrythmia     Assessment & Plan:  Today patient counseled on age appropriate routine health concerns for screening and prevention, each reviewed and up to date or declined. Immunizations reviewed and up to date or declined. Labs/ECG reviewed. Risk factors for depression reviewed and negative. Hearing function and visual acuity are intact. ADLs screened and addressed as needed. Functional ability and level of safety reviewed and appropriate. Education, counseling and referrals performed based on assessed risks today. Patient provided with a copy of personalized plan for preventive services.  Also see problem list. Medications and labs reviewed today.

## 2010-11-01 NOTE — Assessment & Plan Note (Signed)
dexa 10/2009 reviewed - continue vit D + ca and WB activity as ongoing

## 2010-11-01 NOTE — Assessment & Plan Note (Signed)
status post cochlear implant 2008 on right for same -  Cont diuretic and gabapentin as needed

## 2010-11-01 NOTE — Assessment & Plan Note (Signed)
Hx same but never on rx med - check lipids now  

## 2010-11-20 NOTE — Op Note (Signed)
NAME:  Holly Richard, BATIE NO.:  [id number]   MEDICAL RECORD NO.:  [id number]          PATIENT TYPE:  AMB   LOCATION:  SDS                          FACILITY:  [facility]   PHYSICIAN:  Newman Pies, MD            DATE OF BIRTH:  October 25, 1940   DATE OF PROCEDURE:  DATE OF DISCHARGE:                               OPERATIVE REPORT   SURGEON:  Newman Pies, MD.   PREOPERATIVE DIAGNOSES:  1. Right-sided Meniere's disease.  2. Right-sided unilateral deafness.   POSTOPERATIVE DIAGNOSES:  1. Right-sided Meniere's disease.  2. Right-sided unilateral deafness.   PROCEDURE PERFORMED:  Right right-sided Baha implant.   ANESTHESIA:  General endotracheal tube anesthesia.   COMPLICATIONS:  None.   ESTIMATED BLOOD LOSS:  Minimal.   INDICATIONS FOR PROCEDURE:  The patient is a 70 year old female with a  history of right-sided Meniere's disease.  It resulted in profound  hearing loss on the right side.  Due to her unilateral deafness, the  decision was made for the patient to undergo right-sided Baha  implantation.  The risks, benefits, alternatives, and details of the  procedure were discussed with the patient.  Questions were invited and  answered.  Informed consent was obtained.   DESCRIPTION:  The patient was taken to the operating room and placed  supine on the operating table.  General endotracheal tube anesthesia was  administered by the anesthesiologist.  Preop IV antibiotics was given.  The patient was positioned and prepped and draped in a standard fashion  for right Baha implant.  Her right post auricular area was cleaned and  shaped in a standard fashion.  1% lidocaine with 1:100,000 epinephrine  were injected at the planned site of surgery.  The Baha dermatome was  then used to elevate a skin flap.  The underlying soft tissue was  removed, leaving behind a layer of periosteum.  A 3-mm Baha abutment was  then placed without difficulty.  It should be noted that the thickness  of  the patient's skull does not allow the use of the 4-mm abutment.  The  skin flap  was then sutured in place with interrupted 4-0 Monocryl sutures.  Xeroform dressing was then applied.  That concluded procedure for the  patient.  The care of the patient was turned over to the  anesthesiologist.  The patient was awakened from anesthesia without  difficulty.  The patient was transferred to recovery room in good  condition.      Newman Pies, MD  Electronically Signed     ST/MEDQ  D:  10/13/2008  T:  10/14/2008  Job:  [id number]

## 2011-01-15 ENCOUNTER — Other Ambulatory Visit: Payer: Self-pay | Admitting: *Deleted

## 2011-01-15 MED ORDER — LEVOTHYROXINE SODIUM 50 MCG PO TABS: 50.0000 ug | ORAL_TABLET | Freq: Every day | ORAL | Status: DC

## 2011-04-24 ENCOUNTER — Encounter: Payer: Self-pay | Admitting: Internal Medicine

## 2011-05-02 ENCOUNTER — Encounter: Payer: Self-pay | Admitting: Internal Medicine

## 2011-07-10 DIAGNOSIS — L980 Pyogenic granuloma: Secondary | ICD-10-CM | POA: Diagnosis not present

## 2011-07-10 DIAGNOSIS — D235 Other benign neoplasm of skin of trunk: Secondary | ICD-10-CM | POA: Diagnosis not present

## 2011-07-10 DIAGNOSIS — L723 Sebaceous cyst: Secondary | ICD-10-CM | POA: Diagnosis not present

## 2011-07-21 ENCOUNTER — Other Ambulatory Visit: Payer: Self-pay | Admitting: Internal Medicine

## 2011-08-20 ENCOUNTER — Other Ambulatory Visit: Payer: Self-pay | Admitting: Internal Medicine

## 2011-09-06 DIAGNOSIS — Z01419 Encounter for gynecological examination (general) (routine) without abnormal findings: Secondary | ICD-10-CM | POA: Diagnosis not present

## 2011-09-06 DIAGNOSIS — Z124 Encounter for screening for malignant neoplasm of cervix: Secondary | ICD-10-CM | POA: Diagnosis not present

## 2011-09-09 ENCOUNTER — Other Ambulatory Visit: Payer: Self-pay | Admitting: Internal Medicine

## 2011-09-09 DIAGNOSIS — Z1231 Encounter for screening mammogram for malignant neoplasm of breast: Secondary | ICD-10-CM

## 2011-09-23 DIAGNOSIS — L908 Other atrophic disorders of skin: Secondary | ICD-10-CM | POA: Diagnosis not present

## 2011-09-23 DIAGNOSIS — L723 Sebaceous cyst: Secondary | ICD-10-CM | POA: Diagnosis not present

## 2011-09-23 DIAGNOSIS — B079 Viral wart, unspecified: Secondary | ICD-10-CM | POA: Diagnosis not present

## 2011-10-28 DIAGNOSIS — H8109 Meniere's disease, unspecified ear: Secondary | ICD-10-CM | POA: Diagnosis not present

## 2011-10-28 DIAGNOSIS — H903 Sensorineural hearing loss, bilateral: Secondary | ICD-10-CM | POA: Diagnosis not present

## 2011-10-30 ENCOUNTER — Ambulatory Visit
Admission: RE | Admit: 2011-10-30 | Discharge: 2011-10-30 | Disposition: A | Discharge status: Home / Self Care | Payer: Medicare Other | Source: Ambulatory Visit | Attending: Internal Medicine | Admitting: Internal Medicine

## 2011-10-30 DIAGNOSIS — Z1231 Encounter for screening mammogram for malignant neoplasm of breast: Secondary | ICD-10-CM

## 2011-11-04 ENCOUNTER — Ambulatory Visit (INDEPENDENT_AMBULATORY_CARE_PROVIDER_SITE_OTHER): Payer: Medicare Other | Admitting: Internal Medicine

## 2011-11-04 ENCOUNTER — Ambulatory Visit (INDEPENDENT_AMBULATORY_CARE_PROVIDER_SITE_OTHER)
Admission: RE | Admit: 2011-11-04 | Discharge: 2011-11-04 | Disposition: A | Discharge status: Home / Self Care | Payer: Medicare Other | Source: Ambulatory Visit

## 2011-11-04 ENCOUNTER — Other Ambulatory Visit (INDEPENDENT_AMBULATORY_CARE_PROVIDER_SITE_OTHER): Payer: Medicare Other

## 2011-11-04 ENCOUNTER — Encounter: Payer: Self-pay | Admitting: Internal Medicine

## 2011-11-04 VITALS — BP 100/72 | HR 80 | Temp 98.2°F | Ht 62.0 in | Wt 121.0 lb

## 2011-11-04 DIAGNOSIS — M949 Disorder of cartilage, unspecified: Secondary | ICD-10-CM | POA: Diagnosis not present

## 2011-11-04 DIAGNOSIS — E785 Hyperlipidemia, unspecified: Secondary | ICD-10-CM | POA: Diagnosis not present

## 2011-11-04 DIAGNOSIS — M899 Disorder of bone, unspecified: Secondary | ICD-10-CM | POA: Diagnosis not present

## 2011-11-04 DIAGNOSIS — Z0000 Encounter for general adult medical examination without abnormal findings: Secondary | ICD-10-CM

## 2011-11-04 DIAGNOSIS — E039 Hypothyroidism, unspecified: Secondary | ICD-10-CM

## 2011-11-04 DIAGNOSIS — Z136 Encounter for screening for cardiovascular disorders: Secondary | ICD-10-CM

## 2011-11-04 LAB — LIPID PANEL
Cholesterol: 194 mg/dL (ref 0–200)
HDL: 70.1 mg/dL (ref >39.00)
LDL Cholesterol: 116 mg/dL — ABNORMAL HIGH (ref 0–99)
VLDL: 8.4 mg/dL (ref 0.0–40.0)

## 2011-11-04 MED ORDER — BLACK COHOSH 20 MG PO TABS: 20.0000 mg | ORAL_TABLET | Freq: Every day | ORAL | Status: DC

## 2011-11-04 NOTE — Patient Instructions (Signed)
It was good to see you today. Medications reviewed, updated at this time. We have reviewed your prior records including labs and tests today Test(s) ordered today. Your results will be called to you after review (48-72hours after test completion). If any changes need to be made, you will be notified at that time. we'll make referral to [facility] for your bone density. Our office will contact you regarding appointment(s) once made. Your preventative service have been reviewed - after your bone density, all recommended screenings and immunization are up to date - remember to get your flu shot each fall! Please schedule followup in 12 months, call sooner if problems.

## 2011-11-04 NOTE — Assessment & Plan Note (Signed)
The current medical regimen is effective;  continue present plan and medications. Check tsh today, adjust as needed Lab Results  Component Value Date   TSH 0.77 11/01/2010

## 2011-11-04 NOTE — Assessment & Plan Note (Signed)
Hx same but never on rx med - check lipids now  

## 2011-11-04 NOTE — Assessment & Plan Note (Signed)
dexa 10/2009 reviewed, due for 2 year follow up now, will refer  continue vit D + ca and WB activity as ongoing

## 2011-11-04 NOTE — Progress Notes (Signed)
Subjective:    Patient ID: Holly Richard, female    DOB: [birth year], 71 y.o.   MRN: [id number]  HPI  Here for medicare wellness  Diet: heart healthy  Physical activity: active and indep Depression/mood screen: negative Hearing: intact to whispered voice on left side with hearing aide Visual acuity: grossly normal, performs annual eye exam  ADLs: capable Fall risk: none Home safety: good Cognitive evaluation: intact to orientation, naming, recall and repetition EOL planning: adv directives, full code/ I agree  I have personally reviewed and have noted 1. The patient's medical and social history 2. Their use of alcohol, tobacco or illicit drugs 3. Their current medications and supplements 4. The patient's functional ability including ADL's, fall risks, home safety risks and hearing or visual impairment. 5. Diet and physical activities 6. Evidence for depression or mood disorders  Also reviewed chronic medical issues: meniere's dz - dx 11/2006 - multiple interventions (gentamycin injections and cochlear implant on R) and PT - follows with ENT and neuro for same. remains on diuretic tx for same and gabapentin for "pressure" - no recent flares  osteopenia - last dexa 10/2009 with breast center - no med tx beyond calcium + vit d recomended - weight bearing exercises ongoing -   hypothyroid - reports compliance with ongoing medical treatment and no changes in medication dose or frequency. denies adverse side effects related to current therapy. no weight, skin or bowel changes  Past Medical History  Diagnosis Date  . MIGRAINE HEADACHE   . MENIERE'S DISEASE   . DIVERTICULOSIS, COLON   . ARTHRITIS   . OSTEOPENIA   . ALLERGIC RHINITIS   . HYPERLIPIDEMIA   . HYPOTHYROIDISM    Family History  Problem Relation Age of Onset  . Arthritis Mother   . Diabetes Mother   . Heart disease Mother   . Colon cancer Father 62  . Stroke Father   . Breast cancer Sister   . Diabetes Sister     . Lung cancer Sister   . Diabetes Brother   . Prostate cancer Brother   . Brain cancer Other     Nephew   History  Substance Use Topics  . Smoking status: Never Smoker   . Smokeless tobacco: Not on file   Comment: Married, lives with spouse-2 sons and 4 g-kids nearby. 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 Alcohol Use: Not on file    Review of Systems Constitutional: Negative for fever or unexpected weight change.  Respiratory: Negative for cough and shortness of breath.   Cardiovascular: Negative for chest pain or palpitations.  Gastrointestinal: Negative for abdominal pain, bowel changes or nausea and vomiting.  Musculoskeletal: Negative for gait problem.  Skin: Negative for rash.  Neurological: Negative for dizziness.  No other specific complaints in a complete review of systems (except as listed in HPI above).     Objective:   Physical Exam  BP 100/72  Pulse 80  Temp(Src) 98.2 F (36.8 C) (Oral)  Ht 5\' 2"  (1.575 m)  Wt 121 lb (54.885 kg)  BMI 22.13 kg/m2  SpO2 99% Wt Readings from Last 3 Encounters:  11/04/11 121 lb (54.885 kg)  11/01/10 119 lb 1.9 oz (54.032 kg)  07/26/10 121 lb (54.885 kg)   Constitutional: She is fit/thin and appears well-developed and well-nourished. No distress.  HENT: Head: Normocephalic and atraumatic. Nose: Nose normal. Mouth/Throat: Oropharynx is clear and moist. No oropharyngeal exudate.  Eyes: Conjunctivae and EOM are normal. Pupils are equal, round,  and reactive to light. No scleral icterus.  Neck: Normal range of motion. Neck supple. No JVD present. No thyromegaly present.  Cardiovascular: Normal rate, regular rhythm and normal heart sounds.  No murmur heard. Pulmonary/Chest: Effort normal and breath sounds normal. No respiratory distress. She has no wheezes.  Abdominal: Soft. Bowel sounds are normal. She exhibits no distension. There is no tenderness.  Musculoskeletal: Normal range of motion. She exhibits no edema.   Neurological: She is alert and oriented to person, place, and time. No cranial nerve deficit. Coordination normal.  Skin: Skin is warm and dry. No rash noted. No erythema.  Psychiatric: She has a normal mood and affect. Her behavior is normal. Judgment and thought content normal.   Lab Results  Component Value Date   WBC 5.4 09/11/2009   HGB 14.1 09/11/2009   HCT 41.9 09/11/2009   PLT 257 09/11/2009   CHOL 193 11/01/2010   TRIG 72.0 11/01/2010   HDL 64.00 11/01/2010   ALT 25 01/11/2010   AST 26 01/11/2010   NA 139 01/11/2010   K 3.9 01/11/2010   CL 97 01/11/2010   CREATININE 1.00 01/11/2010   BUN 23 01/11/2010   CO2 30 01/11/2010   TSH 0.77 11/01/2010   EKG today: NSR @ 71, no ischemic change or arrythmia (compared to 10/27/10)     Assessment & Plan:  Today patient counseled on age appropriate routine health concerns for screening and prevention, each reviewed and up to date or declined. Immunizations reviewed and up to date or declined. Labs/ECG reviewed. Risk factors for depression reviewed and negative. Hearing function and visual acuity are intact. ADLs screened and addressed as needed. Functional ability and level of safety reviewed and appropriate. Education, counseling and referrals performed based on assessed risks today. Patient provided with a copy of personalized plan for preventive services.  Also see problem list. Medications and labs reviewed today.

## 2011-11-27 DIAGNOSIS — B079 Viral wart, unspecified: Secondary | ICD-10-CM | POA: Diagnosis not present

## 2011-12-20 DIAGNOSIS — H04129 Dry eye syndrome of unspecified lacrimal gland: Secondary | ICD-10-CM | POA: Diagnosis not present

## 2011-12-20 DIAGNOSIS — H524 Presbyopia: Secondary | ICD-10-CM | POA: Diagnosis not present

## 2011-12-20 DIAGNOSIS — H52229 Regular astigmatism, unspecified eye: Secondary | ICD-10-CM | POA: Diagnosis not present

## 2011-12-20 DIAGNOSIS — H5200 Hypermetropia, unspecified eye: Secondary | ICD-10-CM | POA: Diagnosis not present

## 2011-12-21 ENCOUNTER — Other Ambulatory Visit: Payer: Self-pay | Admitting: Internal Medicine

## 2011-12-24 DIAGNOSIS — R51 Headache: Secondary | ICD-10-CM | POA: Diagnosis not present

## 2012-04-13 DIAGNOSIS — Z23 Encounter for immunization: Secondary | ICD-10-CM | POA: Diagnosis not present

## 2012-05-20 DIAGNOSIS — D235 Other benign neoplasm of skin of trunk: Secondary | ICD-10-CM | POA: Diagnosis not present

## 2012-06-10 DIAGNOSIS — B079 Viral wart, unspecified: Secondary | ICD-10-CM | POA: Diagnosis not present

## 2012-06-11 DIAGNOSIS — H04129 Dry eye syndrome of unspecified lacrimal gland: Secondary | ICD-10-CM | POA: Diagnosis not present

## 2012-06-21 ENCOUNTER — Other Ambulatory Visit: Payer: Self-pay | Admitting: Internal Medicine

## 2012-06-23 DIAGNOSIS — Z79899 Other long term (current) drug therapy: Secondary | ICD-10-CM | POA: Diagnosis not present

## 2012-06-23 DIAGNOSIS — R51 Headache: Secondary | ICD-10-CM | POA: Diagnosis not present

## 2012-07-10 ENCOUNTER — Other Ambulatory Visit: Payer: Self-pay | Admitting: Internal Medicine

## 2012-07-10 DIAGNOSIS — Z1231 Encounter for screening mammogram for malignant neoplasm of breast: Secondary | ICD-10-CM

## 2012-09-21 ENCOUNTER — Telehealth: Payer: Self-pay | Admitting: *Deleted

## 2012-09-21 NOTE — Telephone Encounter (Signed)
Ok with me 

## 2012-09-21 NOTE — Telephone Encounter (Signed)
[facility] sent fax to see if they can change Levothyroxine rx to Sandoz brand because they no longer carry the [facility] brand.

## 2012-09-21 NOTE — Telephone Encounter (Signed)
[facility] informed.  

## 2012-09-29 ENCOUNTER — Ambulatory Visit (INDEPENDENT_AMBULATORY_CARE_PROVIDER_SITE_OTHER): Payer: Medicare Other | Admitting: Internal Medicine

## 2012-09-29 ENCOUNTER — Encounter: Payer: Self-pay | Admitting: Internal Medicine

## 2012-09-29 VITALS — BP 120/78 | HR 80 | Temp 97.8°F

## 2012-09-29 DIAGNOSIS — M7711 Lateral epicondylitis, right elbow: Secondary | ICD-10-CM

## 2012-09-29 DIAGNOSIS — M5430 Sciatica, unspecified side: Secondary | ICD-10-CM | POA: Diagnosis not present

## 2012-09-29 DIAGNOSIS — M7710 Lateral epicondylitis, unspecified elbow: Secondary | ICD-10-CM

## 2012-09-29 DIAGNOSIS — M5432 Sciatica, left side: Secondary | ICD-10-CM

## 2012-09-29 MED ORDER — METHOCARBAMOL 500 MG PO TABS: 500.0000 mg | ORAL_TABLET | Freq: Every day | ORAL | Status: DC

## 2012-09-29 MED ORDER — DICLOFENAC SODIUM 75 MG PO TBEC: 75.0000 mg | DELAYED_RELEASE_TABLET | Freq: Two times a day (BID) | ORAL | Status: DC

## 2012-09-29 NOTE — Patient Instructions (Signed)
Your leg pain is related to sciatica anterior elbow pain is "tennis elbow" Will treat both of these conditions with anti-inflammatory diclofenac 75 mg twice daily with food for one week, then as needed for pain symptoms Also take Robaxin muscle relaxer 500 mg at bedtime for one week, then as needed for spasm Your prescription(s) have been submitted to your pharmacy. Please take as directed and contact our office if you believe you are having problem(s) with the medication(s). At drug store, ask pharmacist to show you "band for tennis elbow" and wear this band below your elbow everyday as demonstrated in office today we'll make referral to Physical therapy for these problems. Our office will contact you regarding appointment(s) once made. Home exercises as discussed below Followup as scheduled at the end of April, please call sooner if symptoms worse or unimproved Sciatica Sciatica is pain, weakness, numbness, or tingling along the path of the sciatic nerve. The nerve starts in the lower back and runs down the back of each leg. The nerve controls the muscles in the lower leg and in the back of the knee, while also providing sensation to the back of the thigh, lower leg, and the sole of your foot. Sciatica is a symptom of another medical condition. For instance, nerve damage or certain conditions, such as a herniated disk or bone spur on the spine, pinch or put pressure on the sciatic nerve. This causes the pain, weakness, or other sensations normally associated with sciatica. Generally, sciatica only affects one side of the body. CAUSES   Herniated or slipped disc.  Degenerative disk disease.  A pain disorder involving the narrow muscle in the buttocks (piriformis syndrome).  Pelvic injury or fracture.  Pregnancy.  Tumor (rare). SYMPTOMS  Symptoms can vary from mild to very severe. The symptoms usually travel from the low back to the buttocks and down the back of the leg. Symptoms can  include:  Mild tingling or dull aches in the lower back, leg, or hip.  Numbness in the back of the calf or sole of the foot.  Burning sensations in the lower back, leg, or hip.  Sharp pains in the lower back, leg, or hip.  Leg weakness.  Severe back pain inhibiting movement. These symptoms may get worse with coughing, sneezing, laughing, or prolonged sitting or standing. Also, being overweight may worsen symptoms. DIAGNOSIS  Your caregiver will perform a physical exam to look for common symptoms of sciatica. He or she may ask you to do certain movements or activities that would trigger sciatic nerve pain. Other tests may be performed to find the cause of the sciatica. These may include:  Blood tests.  X-rays.  Imaging tests, such as an MRI or CT scan. TREATMENT  Treatment is directed at the cause of the sciatic pain. Sometimes, treatment is not necessary and the pain and discomfort goes away on its own. If treatment is needed, your caregiver may suggest:  Over-the-counter medicines to relieve pain.  Prescription medicines, such as anti-inflammatory medicine, muscle relaxants, or narcotics.  Applying heat or ice to the painful area.  Steroid injections to lessen pain, irritation, and inflammation around the nerve.  Reducing activity during periods of pain.  Exercising and stretching to strengthen your abdomen and improve flexibility of your spine. Your caregiver may suggest losing weight if the extra weight makes the back pain worse.  Physical therapy.  Surgery to eliminate what is pressing or pinching the nerve, such as a bone spur or part of a  herniated disk. HOME CARE INSTRUCTIONS   Only take over-the-counter or prescription medicines for pain or discomfort as directed by your caregiver.  Apply ice to the affected area for 20 minutes, 3 4 times a day for the first 48 72 hours. Then try heat in the same way.  Exercise, stretch, or perform your usual activities if these  do not aggravate your pain.  Attend physical therapy sessions as directed by your caregiver.  Keep all follow-up appointments as directed by your caregiver.  Do not wear high heels or shoes that do not provide proper support.  Check your mattress to see if it is too soft. A firm mattress may lessen your pain and discomfort. SEEK IMMEDIATE MEDICAL CARE IF:   You lose control of your bowel or bladder (incontinence).  You have increasing weakness in the lower back, pelvis, buttocks, or legs.  You have redness or swelling of your back.  You have a burning sensation when you urinate.  You have pain that gets worse when you lie down or awakens you at night.  Your pain is worse than you have experienced in the past.  Your pain is lasting longer than 4 weeks.  You are suddenly losing weight without reason. MAKE SURE YOU:  Understand these instructions.  Will watch your condition.  Will get help right away if you are not doing well or get worse. Document Released: 06/18/2001 Document Revised: 12/24/2011 Document Reviewed: 11/03/2011 [facility] Patient Information 2013 [place], [place]. Back Exercises Back exercises help treat and prevent back injuries. The goal is to increase your strength in your belly (abdominal) and back muscles. These exercises can also help with flexibility. Start these exercises when told by your doctor. HOME CARE Back exercises include: Pelvic Tilt.  Lie on your back with your knees bent. Tilt your pelvis until the lower part of your back is against the floor. Hold this position 5 to 10 sec. Repeat this exercise 5 to 10 times. Knee to Chest.  Pull 1 knee up against your chest and hold for 20 to 30 seconds. Repeat this with the other knee. This may be done with the other leg straight or bent, whichever feels better. Then, pull both knees up against your chest. Sit-Ups or Curl-Ups.  Bend your knees 90 degrees. Start with tilting your pelvis, and do a partial,  slow sit-up. Only lift your upper half 30 to 45 degrees off the floor. Take at least 2 to 3 seonds for each sit-up. Do not do sit-ups with your knees out straight. If partial sit-ups are difficult, simply do the above but with only tightening your belly (abdominal) muscles and holding it as told. Hip-Lift.  Lie on your back with your knees flexed 90 degrees. Push down with your feet and shoulders as you raise your hips 2 inches off the floor. Hold for 10 seconds, repeat 5 to 10 times. Back Arches.  Lie on your stomach. Prop yourself up on bent elbows. Slowly press on your hands, causing an arch in your low back. Repeat 3 to 5 times. Shoulder-Lifts.  Lie face down with arms beside your body. Keep hips and belly pressed to floor as you slowly lift your head and shoulders off the floor. Do not overdo your exercises. Be careful in the beginning. Exercises may cause you some mild back discomfort. If the pain lasts for more than 15 minutes, stop the exercises until you see your doctor. Improvement with exercise for back problems is slow.  Document Released: 07/27/2010  Document Revised: 09/16/2011 Document Reviewed: 04/25/2011 [facility] Patient Information 2013 [place], [place]. Tennis Elbow Tennis elbow can happen in any sport or job where you overuse your elbow. It is caused by doing the same motion over and over. This makes small tears in the forearm muscles. It can also cause muscle redness, soreness, and puffiness (inflammation). HOME CARE  Rest.  Use your other hand or arm that is not affected. Change your grip.  Only take medicine as told by your doctor.  Put ice on the area after activity.  Put ice in a plastic bag.  Place a towel between your skin and the bag.  Leave the ice on for 15 to 20 minutes, 3 to 4 times a day.  Wear a splint or sling as told by your doctor. This lets the area rest and heal faster. GET HELP RIGHT AWAY IF: Your pain gets worse or does not improve in 2 weeks  even with treatment. MAKE SURE YOU:   Understand these instructions.  Will watch your condition.  Will get help right away if you are not doing well or get worse. Document Released: 12/12/2009 Document Revised: 09/16/2011 Document Reviewed: 12/12/2009 [facility] Patient Information 2013 [place], [place].

## 2012-09-29 NOTE — Progress Notes (Signed)
Subjective:    Patient ID: Holly Richard, female    DOB: [birth year], 72 y.o.   MRN: [id number]  HPI  Here for L low back pain Onset 5 weeks ago, wax and wane pain but gradually increasing intensity Unimproved with single ibuprofen use Symptoms worse when lying supine, better with upright position such as standing or walking No recent history of same, remote left SI inflammation from trauma Radiation of pain into left buttock and left lateral thigh Describes as shooting pain Denies numbness or weakness  Also complains of right elbow pain Onset 1 week ago Precipitated by overuse while performing yard work Improved with ice pack and over-the-counter analgesic cream No direct trauma or swelling Exacerbated by use of right hand  Also reviewed chronic medical issues: meniere's dz - dx 11/2006 - multiple interventions (gentamycin injections and cochlear implant on R) and PT - follows with ENT and neuro for same. remains on diuretic tx for same and gabapentin for "pressure" - no recent flares  osteopenia - last dexa 10/2009 with [facility] - no med tx beyond calcium + vit d recomended - weight bearing exercises ongoing -   hypothyroid - reports compliance with ongoing medical treatment and no changes in medication dose or frequency. denies adverse side effects related to current therapy. no weight, skin or bowel changes  Past Medical History  Diagnosis Date  . MIGRAINE HEADACHE   . MENIERE'S DISEASE   . DIVERTICULOSIS, COLON   . ARTHRITIS   . OSTEOPENIA   . ALLERGIC RHINITIS   . HYPERLIPIDEMIA   . HYPOTHYROIDISM      Review of Systems Constitutional: Negative for fever or unexpected weight change.  Respiratory: Negative for cough and shortness of breath.   Cardiovascular: Negative for chest pain or palpitations.     Objective:   Physical Exam  BP 120/78  Pulse 80  Temp(Src) 97.8 F (36.6 C) (Oral)  SpO2 96% Wt Readings from Last 3 Encounters:  11/04/11 121 lb  (54.885 kg)  11/01/10 119 lb 1.9 oz (54.032 kg)  07/26/10 121 lb (54.885 kg)   Constitutional: She is fit/thin and appears well-developed and well-nourished. No distress.   Neck: Normal range of motion. Neck supple. No JVD present. No thyromegaly present.  Cardiovascular: Normal rate, regular rhythm and normal heart sounds.  No murmur heard. Pulmonary/Chest: Effort normal and breath sounds normal. No respiratory distress. She has no wheezes.  Musculoskeletal: L hip: no pain over left lateral trochanteric bursa. Left hip with full range of motion including internal and external rotation.  Back: full range of motion of thoracic and lumbar spine. Mildly tender along left SI region, radiating to left lateral hip and lateral thigh, stopped above knee. Non tender vertebra to palpation. Negative straight leg raise. DTR's are symmetrically intact. Sensation intact in all dermatomes of the lower extremities. Full strength to manual muscle testing. patient is able to heel toe walk without difficulty and ambulates with mildly antalgic gait.  Right elbow with tenderness over lateral epicondyle, exacerbated by supination. No gross deformities and no joint effusion. No bursa swelling and full range of motion, active and passive  Skin: Skin is warm and dry. No rash noted. No erythema.  Psychiatric: She has a normal mood and affect. Her behavior is normal. Judgment and thought content normal.   Lab Results  Component Value Date   WBC 5.4 09/11/2009   HGB 14.1 09/11/2009   HCT 41.9 09/11/2009   PLT 257 09/11/2009   CHOL 194 11/04/2011  TRIG 42.0 11/04/2011   HDL 70.10 11/04/2011   ALT 25 01/11/2010   AST 26 01/11/2010   NA 139 01/11/2010   K 3.9 01/11/2010   CL 97 01/11/2010   CREATININE 1.00 01/11/2010   BUN 23 01/11/2010   CO2 30 01/11/2010   TSH 0.95 11/04/2011        Assessment & Plan:   L sciatica - history and exam consistent with same. Treat with NSAIDs x1 week, then as needed. Refer for physical therapy and  provided home exercise. Also muscle relaxer at bedtime x1 week, then as needed. Education reassurance provided. Patient to call if symptoms unimproved in next 2-6 weeks, sooner if worse  R elbow lateral epicondylitis (tennis elbow), precipitated by recent overuse ( yard work)- educated on same - NSAIDs as above and PT -advised "elbow band" purchase at outpatient pharmacy

## 2012-10-06 DIAGNOSIS — M25529 Pain in unspecified elbow: Secondary | ICD-10-CM | POA: Diagnosis not present

## 2012-10-06 DIAGNOSIS — M7710 Lateral epicondylitis, unspecified elbow: Secondary | ICD-10-CM | POA: Diagnosis not present

## 2012-10-06 DIAGNOSIS — M545 Low back pain: Secondary | ICD-10-CM | POA: Diagnosis not present

## 2012-10-08 DIAGNOSIS — M545 Low back pain: Secondary | ICD-10-CM | POA: Diagnosis not present

## 2012-10-08 DIAGNOSIS — M7710 Lateral epicondylitis, unspecified elbow: Secondary | ICD-10-CM | POA: Diagnosis not present

## 2012-10-08 DIAGNOSIS — M25529 Pain in unspecified elbow: Secondary | ICD-10-CM | POA: Diagnosis not present

## 2012-10-13 DIAGNOSIS — M545 Low back pain: Secondary | ICD-10-CM | POA: Diagnosis not present

## 2012-10-13 DIAGNOSIS — M7710 Lateral epicondylitis, unspecified elbow: Secondary | ICD-10-CM | POA: Diagnosis not present

## 2012-10-13 DIAGNOSIS — M25529 Pain in unspecified elbow: Secondary | ICD-10-CM | POA: Diagnosis not present

## 2012-10-15 DIAGNOSIS — M7710 Lateral epicondylitis, unspecified elbow: Secondary | ICD-10-CM | POA: Diagnosis not present

## 2012-10-15 DIAGNOSIS — M545 Low back pain: Secondary | ICD-10-CM | POA: Diagnosis not present

## 2012-10-15 DIAGNOSIS — M25529 Pain in unspecified elbow: Secondary | ICD-10-CM | POA: Diagnosis not present

## 2012-10-20 DIAGNOSIS — M7710 Lateral epicondylitis, unspecified elbow: Secondary | ICD-10-CM | POA: Diagnosis not present

## 2012-10-20 DIAGNOSIS — M545 Low back pain: Secondary | ICD-10-CM | POA: Diagnosis not present

## 2012-10-20 DIAGNOSIS — M25529 Pain in unspecified elbow: Secondary | ICD-10-CM | POA: Diagnosis not present

## 2012-10-22 DIAGNOSIS — M7710 Lateral epicondylitis, unspecified elbow: Secondary | ICD-10-CM | POA: Diagnosis not present

## 2012-10-22 DIAGNOSIS — M25529 Pain in unspecified elbow: Secondary | ICD-10-CM | POA: Diagnosis not present

## 2012-10-22 DIAGNOSIS — M545 Low back pain: Secondary | ICD-10-CM | POA: Diagnosis not present

## 2012-10-26 DIAGNOSIS — M25529 Pain in unspecified elbow: Secondary | ICD-10-CM | POA: Diagnosis not present

## 2012-10-26 DIAGNOSIS — M545 Low back pain: Secondary | ICD-10-CM | POA: Diagnosis not present

## 2012-10-26 DIAGNOSIS — M7710 Lateral epicondylitis, unspecified elbow: Secondary | ICD-10-CM | POA: Diagnosis not present

## 2012-10-29 DIAGNOSIS — M545 Low back pain: Secondary | ICD-10-CM | POA: Diagnosis not present

## 2012-10-29 DIAGNOSIS — M25529 Pain in unspecified elbow: Secondary | ICD-10-CM | POA: Diagnosis not present

## 2012-10-29 DIAGNOSIS — M7710 Lateral epicondylitis, unspecified elbow: Secondary | ICD-10-CM | POA: Diagnosis not present

## 2012-10-30 ENCOUNTER — Ambulatory Visit
Admission: RE | Admit: 2012-10-30 | Discharge: 2012-10-30 | Disposition: A | Discharge status: Home / Self Care | Payer: Medicare Other | Source: Ambulatory Visit | Attending: Internal Medicine | Admitting: Internal Medicine

## 2012-10-30 DIAGNOSIS — Z1231 Encounter for screening mammogram for malignant neoplasm of breast: Secondary | ICD-10-CM

## 2012-11-03 DIAGNOSIS — M545 Low back pain: Secondary | ICD-10-CM | POA: Diagnosis not present

## 2012-11-03 DIAGNOSIS — M7710 Lateral epicondylitis, unspecified elbow: Secondary | ICD-10-CM | POA: Diagnosis not present

## 2012-11-03 DIAGNOSIS — M25529 Pain in unspecified elbow: Secondary | ICD-10-CM | POA: Diagnosis not present

## 2012-11-04 ENCOUNTER — Encounter: Payer: Self-pay | Admitting: Internal Medicine

## 2012-11-04 ENCOUNTER — Other Ambulatory Visit (INDEPENDENT_AMBULATORY_CARE_PROVIDER_SITE_OTHER): Payer: Medicare Other

## 2012-11-04 ENCOUNTER — Ambulatory Visit (INDEPENDENT_AMBULATORY_CARE_PROVIDER_SITE_OTHER): Payer: Medicare Other | Admitting: Internal Medicine

## 2012-11-04 VITALS — BP 110/82 | HR 74 | Temp 98.0°F | Ht 62.0 in | Wt 121.0 lb

## 2012-11-04 DIAGNOSIS — Z136 Encounter for screening for cardiovascular disorders: Secondary | ICD-10-CM | POA: Diagnosis not present

## 2012-11-04 DIAGNOSIS — M5430 Sciatica, unspecified side: Secondary | ICD-10-CM | POA: Diagnosis not present

## 2012-11-04 DIAGNOSIS — R5383 Other fatigue: Secondary | ICD-10-CM | POA: Diagnosis not present

## 2012-11-04 DIAGNOSIS — M5432 Sciatica, left side: Secondary | ICD-10-CM | POA: Insufficient documentation

## 2012-11-04 DIAGNOSIS — E785 Hyperlipidemia, unspecified: Secondary | ICD-10-CM

## 2012-11-04 DIAGNOSIS — R413 Other amnesia: Secondary | ICD-10-CM

## 2012-11-04 DIAGNOSIS — R5381 Other malaise: Secondary | ICD-10-CM | POA: Diagnosis not present

## 2012-11-04 DIAGNOSIS — Z0000 Encounter for general adult medical examination without abnormal findings: Secondary | ICD-10-CM

## 2012-11-04 LAB — CBC WITH DIFFERENTIAL/PLATELET
Basophils Relative: 0.7 % (ref 0.0–3.0)
Eosinophils Relative: 1.6 % (ref 0.0–5.0)
Hemoglobin: 14.1 g/dL (ref 12.0–15.0)
Lymphocytes Relative: 31 % (ref 12.0–46.0)
MCV: 91.9 fl (ref 78.0–100.0)
Neutro Abs: 4.1 10*3/uL (ref 1.4–7.7)
Neutrophils Relative %: 60.9 % (ref 43.0–77.0)
RBC: 4.52 Mil/uL (ref 3.87–5.11)
WBC: 6.7 10*3/uL (ref 4.5–10.5)

## 2012-11-04 LAB — HEPATIC FUNCTION PANEL
Albumin: 4.1 g/dL (ref 3.5–5.2)
Alkaline Phosphatase: 44 U/L (ref 39–117)
Total Protein: 7.6 g/dL (ref 6.0–8.3)

## 2012-11-04 LAB — LIPID PANEL
Cholesterol: 205 mg/dL — ABNORMAL HIGH (ref 0–200)
Total CHOL/HDL Ratio: 3
Triglycerides: 59 mg/dL (ref 0.0–149.0)
VLDL: 11.8 mg/dL (ref 0.0–40.0)

## 2012-11-04 LAB — BASIC METABOLIC PANEL
CO2: 31 mEq/L (ref 19–32)
Calcium: 9.5 mg/dL (ref 8.4–10.5)
Chloride: 101 mEq/L (ref 96–112)
Creatinine, Ser: 1 mg/dL (ref 0.4–1.2)
Sodium: 139 mEq/L (ref 135–145)

## 2012-11-04 LAB — TSH: TSH: 1.6 u[IU]/mL (ref 0.35–5.50)

## 2012-11-04 MED ORDER — PREDNISONE (PAK) 10 MG PO TABS: 10.0000 mg | ORAL_TABLET | ORAL | Status: DC

## 2012-11-04 NOTE — Patient Instructions (Addendum)
It was good to see you today. We have reviewed your prior records including labs and tests today Health Maintenance reviewed - all recommended immunizations and age-appropriate screenings are up-to-date. Test(s) ordered today. Your results will be released to MyChart (or called to you) after review, usually within 72hours after test completion. If any changes need to be made, you will be notified at that same time. we'll make referral for MRI brain to look into memory loss problems . Our office will contact you regarding appointment(s) once made. Treat back and elbow pain with prednisone taper over next 6 days. - Your prescription(s) have been submitted to your pharmacy. Please take as directed and contact our office if you believe you are having problem(s) with the medication(s). Other medications reviewed and updated, no additional changes recommended Please contact us in 2 weeks to let us know the state of your back pain and elbow pain if improved - or- contact us in 1 week if symptoms worse or unimproved Please schedule followup in 3-4 months for memory check, call sooner if problems. Health Maintenance, Females A healthy lifestyle and preventative care can promote health and wellness.  Maintain regular health, dental, and eye exams.  Eat a healthy diet. Foods like vegetables, fruits, whole grains, low-fat dairy products, and lean protein foods contain the nutrients you need without too many calories. Decrease your intake of foods high in solid fats, added sugars, and salt. Get information about a proper diet from your caregiver, if necessary.  Regular physical exercise is one of the most important things you can do for your health. Most adults should get at least 150 minutes of moderate-intensity exercise (any activity that increases your heart rate and causes you to sweat) each week. In addition, most adults need muscle-strengthening exercises on 2 or more days a week.   Maintain a healthy  weight. The body mass index (BMI) is a screening tool to identify possible weight problems. It provides an estimate of body fat based on height and weight. Your caregiver can help determine your BMI, and can help you achieve or maintain a healthy weight. For adults 20 years and older:  A BMI below 18.5 is considered underweight.  A BMI of 18.5 to 24.9 is normal.  A BMI of 25 to 29.9 is considered overweight.  A BMI of 30 and above is considered obese.  Maintain normal blood lipids and cholesterol by exercising and minimizing your intake of saturated fat. Eat a balanced diet with plenty of fruits and vegetables. Blood tests for lipids and cholesterol should begin at age 58 and be repeated every 5 years. If your lipid or cholesterol levels are high, you are over 50, or you are a high risk for heart disease, you may need your cholesterol levels checked more frequently.Ongoing high lipid and cholesterol levels should be treated with medicines if diet and exercise are not effective.  If you smoke, find out from your caregiver how to quit. If you do not use tobacco, do not start.  If you are pregnant, do not drink alcohol. If you are breastfeeding, be very cautious about drinking alcohol. If you are not pregnant and choose to drink alcohol, do not exceed 1 drink per day. One drink is considered to be 12 ounces (355 mL) of beer, 5 ounces (148 mL) of wine, or 1.5 ounces (44 mL) of liquor.  Avoid use of street drugs. Do not share needles with anyone. Ask for help if you need support or instructions about stopping  the use of drugs.  High blood pressure causes heart disease and increases the risk of stroke. Blood pressure should be checked at least every 1 to 2 years. Ongoing high blood pressure should be treated with medicines, if weight loss and exercise are not effective.  If you are 81 to 70 years old, ask your caregiver if you should take aspirin to prevent strokes.  Diabetes screening involves  taking a blood sample to check your fasting blood sugar level. This should be done once every 3 years, after age 66, if you are within normal weight and without risk factors for diabetes. Testing should be considered at a younger age or be carried out more frequently if you are overweight and have at least 1 risk factor for diabetes.  Breast cancer screening is essential preventative care for women. You should practice "breast self-awareness." This means understanding the normal appearance and feel of your breasts and may include breast self-examination. Any changes detected, no matter how small, should be reported to a caregiver. Women in their 39s and 30s should have a clinical breast exam (CBE) by a caregiver as part of a regular health exam every 1 to 3 years. After age 60, women should have a CBE every year. Starting at age 30, women should consider having a mammogram (breast X-ray) every year. Women who have a family history of breast cancer should talk to their caregiver about genetic screening. Women at a high risk of breast cancer should talk to their caregiver about having an MRI and a mammogram every year.  The Pap test is a screening test for cervical cancer. Women should have a Pap test starting at age 32. Between ages 73 and 52, Pap tests should be repeated every 2 years. Beginning at age 33, you should have a Pap test every 3 years as long as the past 3 Pap tests have been normal. If you had a hysterectomy for a problem that was not cancer or a condition that could lead to cancer, then you no longer need Pap tests. If you are between ages 71 and 74, and you have had normal Pap tests going back 10 years, you no longer need Pap tests. If you have had past treatment for cervical cancer or a condition that could lead to cancer, you need Pap tests and screening for cancer for at least 20 years after your treatment. If Pap tests have been discontinued, risk factors (such as a new sexual partner) need to  be reassessed to determine if screening should be resumed. Some women have medical problems that increase the chance of getting cervical cancer. In these cases, your caregiver may recommend more frequent screening and Pap tests.  The human papillomavirus (HPV) test is an additional test that may be used for cervical cancer screening. The HPV test looks for the virus that can cause the cell changes on the cervix. The cells collected during the Pap test can be tested for HPV. The HPV test could be used to screen women aged 13 years and older, and should be used in women of any age who have unclear Pap test results. After the age of 46, women should have HPV testing at the same frequency as a Pap test.  Colorectal cancer can be detected and often prevented. Most routine colorectal cancer screening begins at the age of 74 and continues through age 102. However, your caregiver may recommend screening at an earlier age if you have risk factors for colon cancer. On a  yearly basis, your caregiver may provide home test kits to check for hidden blood in the stool. Use of a small camera at the end of a tube, to directly examine the colon (sigmoidoscopy or colonoscopy), can detect the earliest forms of colorectal cancer. Talk to your caregiver about this at age 78, when routine screening begins. Direct examination of the colon should be repeated every 5 to 10 years through age 18, unless early forms of pre-cancerous polyps or small growths are found.  Hepatitis C blood testing is recommended for all people born from [birth year] through 1965 and any individual with known risks for hepatitis C.  Practice safe sex. Use condoms and avoid high-risk sexual practices to reduce the spread of sexually transmitted infections (STIs). Sexually active women aged 26 and younger should be checked for Chlamydia, which is a common sexually transmitted infection. Older women with new or multiple partners should also be tested for Chlamydia.  Testing for other STIs is recommended if you are sexually active and at increased risk.  Osteoporosis is a disease in which the bones lose minerals and strength with aging. This can result in serious bone fractures. The risk of osteoporosis can be identified using a bone density scan. Women ages 76 and over and women at risk for fractures or osteoporosis should discuss screening with their caregivers. Ask your caregiver whether you should be taking a calcium supplement or vitamin D to reduce the rate of osteoporosis.  Menopause can be associated with physical symptoms and risks. Hormone replacement therapy is available to decrease symptoms and risks. You should talk to your caregiver about whether hormone replacement therapy is right for you.  Use sunscreen with a sun protection factor (SPF) of 30 or greater. Apply sunscreen liberally and repeatedly throughout the day. You should seek shade when your shadow is shorter than you. Protect yourself by wearing long sleeves, pants, a wide-brimmed hat, and sunglasses year round, whenever you are outdoors.  Notify your caregiver of new moles or changes in moles, especially if there is a change in shape or color. Also notify your caregiver if a mole is larger than the size of a pencil eraser.  Stay current with your immunizations. Document Released: 01/07/2011 Document Revised: 09/16/2011 Document Reviewed: 01/07/2011 [facility] Patient Information 2013 [place], [place]. Sciatica Sciatica is pain, weakness, numbness, or tingling along your sciatic nerve. The nerve starts in the lower back and runs down the back of each leg. Nerve damage or certain conditions pinch or put pressure on the sciatic nerve. This causes the pain, weakness, and other discomforts of sciatica. HOME CARE   Only take medicine as told by your doctor.  Apply ice to the affected area for 20 minutes. Do this 3 4 times a day for the first 48 72 hours. Then try heat in the same  way.  Exercise, stretch, or do your usual activities if these do not make your pain worse.  Go to physical therapy as told by your doctor.  Keep all doctor visits as told.  Do not wear high heels or shoes that are not supportive.  Get a firm mattress if your mattress is too soft to lessen pain and discomfort. GET HELP RIGHT AWAY IF:   You cannot control when you poop (bowel movement) or pee (urinate).  You have more weakness in your lower back, lower belly (pelvis), butt (buttocks), or legs.  You have redness or puffiness (swelling) of your back.  You have a burning feeling when you pee.  You have pain that gets worse when you lie down.  You have pain that wakes you from your sleep.  Your pain is worse than past pain.  Your pain lasts longer than 4 weeks.  You are suddenly losing weight without reason. MAKE SURE YOU:   Understand these instructions.  Will watch this condition.  Will get help right away if you are not doing well or get worse. Document Released: 04/02/2008 Document Revised: 12/24/2011 Document Reviewed: 11/03/2011 [facility] Patient Information 2013 [place], [place].

## 2012-11-04 NOTE — Progress Notes (Signed)
Subjective:    Patient ID: Holly Richard, female    DOB: 1941/01/11, 71 y.o.   MRN: [id number]  HPI  Here for medicare wellness  Diet: heart healthy  Physical activity: active and indep Depression/mood screen: negative Hearing: intact to whispered voice on left side with hearing aide Visual acuity: grossly normal, performs annual eye exam  ADLs: capable Fall risk: none Home safety: good Cognitive evaluation: intact to orientation, naming, recall and repetition EOL planning: adv directives, full code/ I agree  I have personally reviewed and have noted 1. The patient's medical and social history 2. Their use of alcohol, tobacco or illicit drugs 3. Their current medications and supplements 4. The patient's functional ability including ADL's, fall risks, home safety risks and hearing or visual impairment. 5. Diet and physical activities 6. Evidence for depression or mood disorders  Also complains of continued R elbow pain (ongoing 5 weeks) and LLE pain (ongoing 10 weeks), both worse with exertion/use, better with rest - both transiently improved with PT but unchanged with Voltaren x 1 week and robaxin - see 09/29/12 OV for same (reviewed)  Also reviewed chronic medical issues: meniere's dz - dx 11/2006 - multiple interventions (gentamycin injections and cochlear implant on R) and PT - follows with ENT and neuro for same. remains on diuretic tx for same and gabapentin for "pressure" - no recent flares  osteopenia - last dexa 10/2011 : -1.2;  no med tx beyond calcium + vit d recomended - weight bearing exercises ongoing -   hypothyroid - reports compliance with ongoing medical treatment and no changes in medication dose or frequency. denies adverse side effects related to current therapy. no weight, skin or bowel changes  Past Medical History  Diagnosis Date  . MIGRAINE HEADACHE   . MENIERE'S DISEASE   . DIVERTICULOSIS, COLON   . ARTHRITIS   . OSTEOPENIA   . ALLERGIC RHINITIS    . HYPERLIPIDEMIA   . HYPOTHYROIDISM    Family History  Problem Relation Age of Onset  . Arthritis Mother   . Diabetes Mother   . Heart disease Mother   . Colon cancer Father 28  . Stroke Father   . Breast cancer Sister   . Diabetes Sister   . Lung cancer Sister   . Diabetes Brother   . Prostate cancer Brother   . Brain cancer Other     Nephew   History  Substance Use Topics  . Smoking status: Never Smoker   . Smokeless tobacco: Not on file     Comment: Married, lives with spouse-2 sons and 4 g-kids nearby. 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 Alcohol Use: Not on file    Review of Systems Constitutional: Negative for fever or unexpected weight change. complains of fatigue Respiratory: Negative for cough and shortness of breath.   Cardiovascular: Negative for chest pain or palpitations.  Gastrointestinal: Negative for abdominal pain, bowel changes or nausea and vomiting.  Musculoskeletal: Negative for gait problem.  Skin: Negative for rash.  Neurological: Negative for dizziness. complains of memory "slow", episode of "amnesia" last week, no motor or speech deficits, no headache or balance problems, no seizure  No other specific complaints in a complete review of systems (except as listed in HPI above).     Objective:   Physical Exam  BP 110/82  Pulse 74  Temp(Src) 98 F (36.7 C) (Oral)  Ht 5\' 2"  (1.575 m)  Wt 121 lb (54.885 kg)  BMI 22.13 kg/m2  SpO2 95% Wt Readings from Last 3 Encounters:  11/04/12 121 lb (54.885 kg)  11/04/11 121 lb (54.885 kg)  11/01/10 119 lb 1.9 oz (54.032 kg)   Constitutional: She is fit/thin and appears well-developed and well-nourished. No distress.  HENT: Head: Normocephalic and atraumatic. Ears: B TMs ok, no erythema or effusion; Nose: Nose normal. Mouth/Throat: Oropharynx is clear and moist. No oropharyngeal exudate.  Eyes: Conjunctivae and EOM are normal. Pupils are equal, round, and reactive to light. No  scleral icterus.  Neck: Normal range of motion. Neck supple. No JVD present. No thyromegaly present.  Cardiovascular: Normal rate, regular rhythm and normal heart sounds.  No murmur heard. No BLE edema. Pulmonary/Chest: Effort normal and breath sounds normal. No respiratory distress. She has no wheezes.  Abdominal: Soft. Bowel sounds are normal. She exhibits no distension. There is no tenderness. no masses Musculoskeletal:  L hip: no pain over left lateral trochanteric bursa. Left hip with full range of motion including internal and external rotation.  Back: full range of motion of thoracic and lumbar spine. Mildly tender along left SI region, radiating to left lateral hip and lateral thigh, stopped above knee. Non tender vertebra to palpation. Negative straight leg raise. DTR's are symmetrically intact. Sensation intact in all dermatomes of the lower extremities. Full strength to manual muscle testing. patient is able to heel toe walk without difficulty and ambulates with mildly antalgic gait.  Right elbow with tenderness over lateral epicondyle, exacerbated by supination. No gross deformities and no joint effusion. No bursa swelling and full range of motion, active and passive  Neurological: She is alert and oriented to person, place, and time. No cranial nerve deficit. Coordination, balance, strength, speech and gait are normal. normal rapid alt movements, F to N. 2/3 recall at 3 minutes. Skin: Skin is warm and dry. No rash noted. No erythema.  Psychiatric: She has a normal mood and affect. Her behavior is normal. Judgment and thought content normal.    Lab Results  Component Value Date   WBC 5.4 09/11/2009   HGB 14.1 09/11/2009   HCT 41.9 09/11/2009   PLT 257 09/11/2009   CHOL 194 11/04/2011   TRIG 42.0 11/04/2011   HDL 70.10 11/04/2011   ALT 25 01/11/2010   AST 26 01/11/2010   NA 139 01/11/2010   K 3.9 01/11/2010   CL 97 01/11/2010   CREATININE 1.00 01/11/2010   BUN 23 01/11/2010   CO2 30 01/11/2010   TSH  0.95 11/04/2011   EKG today: NSR @ 74, no ischemic change or arrythmia (compared to 4/13)     Assessment & Plan:  Today patient counseled on age appropriate routine health concerns for screening and prevention, each reviewed and up to date or declined. Immunizations reviewed and up to date or declined. Labs/ECG reviewed. Risk factors for depression reviewed and negative. Hearing function and visual acuity are intact. ADLs screened and addressed as needed. Functional ability and level of safety reviewed and appropriate. Education, counseling and referrals performed based on assessed risks today. Patient provided with a copy of personalized plan for preventive services.   Fatigue - nonspecific symptoms/exam - check screening labs  Memory loss - nonspecific, prior ENT interventions with headache and "amnesia" reviewed - check labs for reversible causes of dementia and MRI brain -   Right elbow lateral epicondylitis - continue physical therapy, prednisone for inflammation as for left-sided sciatica symptoms, see below. Consider steroid injection referral to ortho if unimproved in the next 12 weeks. Reassurance provided  Also see problem list. Medications and labs reviewed today.

## 2012-11-04 NOTE — Assessment & Plan Note (Signed)
L sciatica - history and exam consistent with same.  Unimproved with NSAIDs + muscle relaxer 5 weeks ago ongoing physical therapy and home exercise with transient relief tx pred taper x 6 das now, MRI L spine if unimproved -  Patient to call if symptoms unimproved in next 2 weeks, sooner if worse

## 2012-11-05 DIAGNOSIS — M7710 Lateral epicondylitis, unspecified elbow: Secondary | ICD-10-CM | POA: Diagnosis not present

## 2012-11-05 DIAGNOSIS — M25519 Pain in unspecified shoulder: Secondary | ICD-10-CM | POA: Diagnosis not present

## 2012-11-05 DIAGNOSIS — M545 Low back pain: Secondary | ICD-10-CM | POA: Diagnosis not present

## 2012-11-10 DIAGNOSIS — M25529 Pain in unspecified elbow: Secondary | ICD-10-CM | POA: Diagnosis not present

## 2012-11-10 DIAGNOSIS — M7710 Lateral epicondylitis, unspecified elbow: Secondary | ICD-10-CM | POA: Diagnosis not present

## 2012-11-10 DIAGNOSIS — M545 Low back pain: Secondary | ICD-10-CM | POA: Diagnosis not present

## 2012-11-12 ENCOUNTER — Ambulatory Visit
Admission: RE | Admit: 2012-11-12 | Discharge: 2012-11-12 | Disposition: A | Discharge status: Home / Self Care | Payer: Medicare Other | Source: Ambulatory Visit | Attending: Internal Medicine | Admitting: Internal Medicine

## 2012-11-12 ENCOUNTER — Telehealth: Payer: Self-pay | Admitting: *Deleted

## 2012-11-12 DIAGNOSIS — M25529 Pain in unspecified elbow: Secondary | ICD-10-CM | POA: Diagnosis not present

## 2012-11-12 DIAGNOSIS — M5430 Sciatica, unspecified side: Secondary | ICD-10-CM

## 2012-11-12 DIAGNOSIS — R413 Other amnesia: Secondary | ICD-10-CM

## 2012-11-12 DIAGNOSIS — M25521 Pain in right elbow: Secondary | ICD-10-CM

## 2012-11-12 DIAGNOSIS — M545 Low back pain: Secondary | ICD-10-CM | POA: Diagnosis not present

## 2012-11-12 DIAGNOSIS — M7710 Lateral epicondylitis, unspecified elbow: Secondary | ICD-10-CM | POA: Diagnosis not present

## 2012-11-12 DIAGNOSIS — G43909 Migraine, unspecified, not intractable, without status migrainosus: Secondary | ICD-10-CM | POA: Diagnosis not present

## 2012-11-12 NOTE — Telephone Encounter (Signed)
Pt informed of referral to ortho for back and neck pain-pt would prefer to go to [facility] if possible. She also will be out of town next Monday until Wednesday.

## 2012-11-12 NOTE — Telephone Encounter (Signed)
I will refer to ortho for eval of this to see if MRI is appropriate or if other tx is needed - [facility] will call re: same

## 2012-11-12 NOTE — Telephone Encounter (Signed)
Pt calling to report on status of back and elbow pain as discussed at last OV-Prednisone has helped some but she is still having some pain. Pt is also going to therapy and therapist had suggested maybe getting an MRI of elbow to check for tear since pt is still having elbow pain.

## 2012-11-23 ENCOUNTER — Other Ambulatory Visit: Payer: Self-pay | Admitting: *Deleted

## 2012-11-23 DIAGNOSIS — M7710 Lateral epicondylitis, unspecified elbow: Secondary | ICD-10-CM | POA: Diagnosis not present

## 2012-11-23 DIAGNOSIS — M5430 Sciatica, unspecified side: Secondary | ICD-10-CM | POA: Diagnosis not present

## 2012-11-23 MED ORDER — LEVOTHYROXINE SODIUM 50 MCG PO TABS: ORAL_TABLET | ORAL | Status: DC

## 2012-11-23 NOTE — Telephone Encounter (Signed)
R'cd fax from [facility] ([facility]) for refill of Levothyroxine.

## 2012-11-25 DIAGNOSIS — H8109 Meniere's disease, unspecified ear: Secondary | ICD-10-CM | POA: Diagnosis not present

## 2012-11-25 DIAGNOSIS — H905 Unspecified sensorineural hearing loss: Secondary | ICD-10-CM | POA: Diagnosis not present

## 2012-12-07 DIAGNOSIS — M7710 Lateral epicondylitis, unspecified elbow: Secondary | ICD-10-CM | POA: Diagnosis not present

## 2012-12-23 DIAGNOSIS — R51 Headache: Secondary | ICD-10-CM | POA: Diagnosis not present

## 2013-01-04 DIAGNOSIS — M7710 Lateral epicondylitis, unspecified elbow: Secondary | ICD-10-CM | POA: Diagnosis not present

## 2013-02-01 DIAGNOSIS — M5430 Sciatica, unspecified side: Secondary | ICD-10-CM | POA: Diagnosis not present

## 2013-02-01 DIAGNOSIS — M7710 Lateral epicondylitis, unspecified elbow: Secondary | ICD-10-CM | POA: Diagnosis not present

## 2013-02-03 ENCOUNTER — Ambulatory Visit: Payer: Medicare Other | Admitting: Internal Medicine

## 2013-03-02 DIAGNOSIS — H04129 Dry eye syndrome of unspecified lacrimal gland: Secondary | ICD-10-CM | POA: Diagnosis not present

## 2013-03-02 DIAGNOSIS — H16229 Keratoconjunctivitis sicca, not specified as Sjogren's, unspecified eye: Secondary | ICD-10-CM | POA: Diagnosis not present

## 2013-03-29 DIAGNOSIS — Z23 Encounter for immunization: Secondary | ICD-10-CM | POA: Diagnosis not present

## 2013-03-30 ENCOUNTER — Encounter: Payer: Self-pay | Admitting: Internal Medicine

## 2013-05-03 ENCOUNTER — Other Ambulatory Visit: Payer: Self-pay | Admitting: Internal Medicine

## 2013-05-26 DIAGNOSIS — H8109 Meniere's disease, unspecified ear: Secondary | ICD-10-CM | POA: Diagnosis not present

## 2013-05-26 DIAGNOSIS — H903 Sensorineural hearing loss, bilateral: Secondary | ICD-10-CM | POA: Diagnosis not present

## 2013-05-26 DIAGNOSIS — H905 Unspecified sensorineural hearing loss: Secondary | ICD-10-CM | POA: Diagnosis not present

## 2013-06-23 DIAGNOSIS — Z79899 Other long term (current) drug therapy: Secondary | ICD-10-CM | POA: Diagnosis not present

## 2013-06-28 DIAGNOSIS — H905 Unspecified sensorineural hearing loss: Secondary | ICD-10-CM | POA: Diagnosis not present

## 2013-06-28 DIAGNOSIS — H8109 Meniere's disease, unspecified ear: Secondary | ICD-10-CM | POA: Diagnosis not present

## 2013-07-19 DIAGNOSIS — H9209 Otalgia, unspecified ear: Secondary | ICD-10-CM | POA: Diagnosis not present

## 2013-07-19 DIAGNOSIS — H905 Unspecified sensorineural hearing loss: Secondary | ICD-10-CM | POA: Diagnosis not present

## 2013-07-19 DIAGNOSIS — H8109 Meniere's disease, unspecified ear: Secondary | ICD-10-CM | POA: Diagnosis not present

## 2013-08-17 ENCOUNTER — Other Ambulatory Visit: Payer: Self-pay

## 2013-08-17 DIAGNOSIS — Z1231 Encounter for screening mammogram for malignant neoplasm of breast: Secondary | ICD-10-CM

## 2013-09-07 DIAGNOSIS — H16229 Keratoconjunctivitis sicca, not specified as Sjogren's, unspecified eye: Secondary | ICD-10-CM | POA: Diagnosis not present

## 2013-09-10 DIAGNOSIS — Z1151 Encounter for screening for human papillomavirus (HPV): Secondary | ICD-10-CM | POA: Diagnosis not present

## 2013-09-10 DIAGNOSIS — Z01419 Encounter for gynecological examination (general) (routine) without abnormal findings: Secondary | ICD-10-CM | POA: Diagnosis not present

## 2013-09-10 DIAGNOSIS — Z124 Encounter for screening for malignant neoplasm of cervix: Secondary | ICD-10-CM | POA: Diagnosis not present

## 2013-11-01 ENCOUNTER — Ambulatory Visit
Admission: RE | Admit: 2013-11-01 | Discharge: 2013-11-01 | Disposition: A | Discharge status: Home / Self Care | Payer: Medicare Other | Source: Ambulatory Visit

## 2013-11-01 DIAGNOSIS — Z1231 Encounter for screening mammogram for malignant neoplasm of breast: Secondary | ICD-10-CM | POA: Diagnosis not present

## 2013-11-08 ENCOUNTER — Ambulatory Visit (INDEPENDENT_AMBULATORY_CARE_PROVIDER_SITE_OTHER): Payer: Medicare Other | Admitting: Internal Medicine

## 2013-11-08 ENCOUNTER — Other Ambulatory Visit (INDEPENDENT_AMBULATORY_CARE_PROVIDER_SITE_OTHER): Payer: Medicare Other

## 2013-11-08 ENCOUNTER — Encounter: Payer: Self-pay | Admitting: Internal Medicine

## 2013-11-08 ENCOUNTER — Ambulatory Visit (INDEPENDENT_AMBULATORY_CARE_PROVIDER_SITE_OTHER)
Admission: RE | Admit: 2013-11-08 | Discharge: 2013-11-08 | Disposition: A | Discharge status: Home / Self Care | Payer: Medicare Other | Source: Ambulatory Visit | Attending: Internal Medicine | Admitting: Internal Medicine

## 2013-11-08 VITALS — BP 110/72 | HR 77 | Temp 98.4°F | Ht 62.0 in | Wt 119.2 lb

## 2013-11-08 DIAGNOSIS — S335XXA Sprain of ligaments of lumbar spine, initial encounter: Secondary | ICD-10-CM

## 2013-11-08 DIAGNOSIS — M949 Disorder of cartilage, unspecified: Principal | ICD-10-CM

## 2013-11-08 DIAGNOSIS — Z0000 Encounter for general adult medical examination without abnormal findings: Secondary | ICD-10-CM

## 2013-11-08 DIAGNOSIS — M899 Disorder of bone, unspecified: Secondary | ICD-10-CM

## 2013-11-08 DIAGNOSIS — E039 Hypothyroidism, unspecified: Secondary | ICD-10-CM | POA: Diagnosis not present

## 2013-11-08 DIAGNOSIS — E785 Hyperlipidemia, unspecified: Secondary | ICD-10-CM

## 2013-11-08 DIAGNOSIS — Z23 Encounter for immunization: Secondary | ICD-10-CM

## 2013-11-08 DIAGNOSIS — S39012A Strain of muscle, fascia and tendon of lower back, initial encounter: Secondary | ICD-10-CM

## 2013-11-08 LAB — URINALYSIS, ROUTINE W REFLEX MICROSCOPIC
Bilirubin Urine: NEGATIVE
KETONES UR: NEGATIVE
Nitrite: NEGATIVE
PH: 6 (ref 5.0–8.0)
Total Protein, Urine: NEGATIVE
URINE GLUCOSE: NEGATIVE
Urobilinogen, UA: 0.2 (ref 0.0–1.0)

## 2013-11-08 LAB — LIPID PANEL
CHOL/HDL RATIO: 3
Cholesterol: 192 mg/dL (ref 0–200)
HDL: 61.9 mg/dL (ref >39.00)
LDL Cholesterol: 117 mg/dL — ABNORMAL HIGH (ref 0–99)
TRIGLYCERIDES: 67 mg/dL (ref 0.0–149.0)
VLDL: 13.4 mg/dL (ref 0.0–40.0)

## 2013-11-08 LAB — HEPATIC FUNCTION PANEL
ALBUMIN: 3.9 g/dL (ref 3.5–5.2)
ALT: 16 U/L (ref 0–35)
AST: 18 U/L (ref 0–37)
Alkaline Phosphatase: 45 U/L (ref 39–117)
BILIRUBIN DIRECT: 0.1 mg/dL (ref 0.0–0.3)
TOTAL PROTEIN: 6.9 g/dL (ref 6.0–8.3)
Total Bilirubin: 0.6 mg/dL (ref 0.3–1.2)

## 2013-11-08 LAB — BASIC METABOLIC PANEL
BUN: 24 mg/dL — ABNORMAL HIGH (ref 6–23)
CALCIUM: 9.5 mg/dL (ref 8.4–10.5)
CO2: 29 meq/L (ref 19–32)
CREATININE: 0.9 mg/dL (ref 0.4–1.2)
Chloride: 102 mEq/L (ref 96–112)
GFR: 70.16 mL/min (ref >60.00)
Glucose, Bld: 85 mg/dL (ref 70–99)
Potassium: 3.4 mEq/L — ABNORMAL LOW (ref 3.5–5.1)
Sodium: 139 mEq/L (ref 135–145)

## 2013-11-08 LAB — TSH: TSH: 1.16 u[IU]/mL (ref 0.35–5.50)

## 2013-11-08 MED ORDER — LEVOTHYROXINE SODIUM 50 MCG PO TABS: 50.0000 ug | ORAL_TABLET | Freq: Every day | ORAL | Status: DC

## 2013-11-08 MED ORDER — METHOCARBAMOL 500 MG PO TABS: 500.0000 mg | ORAL_TABLET | Freq: Every day | ORAL | Status: DC

## 2013-11-08 NOTE — Progress Notes (Signed)
Subjective:    Patient ID: Holly Richard, female    DOB: May 05, 1941, 73 y.o.   MRN: [id number]  HPI   Here for medicare wellness  Diet: heart healthy  Physical activity: walking 4x/week, active Depression/mood screen: negative Hearing: intact to whispered voice Visual acuity: grossly normal, performs annual eye exam  ADLs: capable Fall risk: none Home safety: good Cognitive evaluation: intact to orientation, naming, recall and repetition EOL planning: adv directives, full code/ I agree  I have personally reviewed and have noted 1. The patient's medical and social history 2. Their use of alcohol, tobacco or illicit drugs 3. Their current medications and supplements 4. The patient's functional ability including ADL's, fall risks, home safety risks and hearing or visual impairment. 5. Diet and physical activities 6. Evidence for depression or mood disorders  Also reviewed chronic medical issues and interval medical events  Past Medical History  Diagnosis Date  . MIGRAINE HEADACHE   . MENIERE'S DISEASE   . DIVERTICULOSIS, COLON   . ARTHRITIS   . OSTEOPENIA   . ALLERGIC RHINITIS   . HYPERLIPIDEMIA   . HYPOTHYROIDISM    Family History  Problem Relation Age of Onset  . Arthritis Mother   . Diabetes Mother   . Heart disease Mother   . Colon cancer Father 64  . Stroke Father   . Breast cancer Sister   . Diabetes Sister   . Lung cancer Sister   . Diabetes Brother   . Prostate cancer Brother   . Brain cancer Other     Nephew   History  Substance Use Topics  . Smoking status: Never Smoker   . Smokeless tobacco: Not on file     Comment: Married, lives with spouse-2 sons and 4 g-kids nearby. 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 Alcohol Use: Not on file    Review of Systems  Constitutional: Positive for fatigue. Negative for unexpected weight change.  Respiratory: Negative for cough, shortness of breath and wheezing.   Cardiovascular: Negative  for chest pain, palpitations and leg swelling.  Gastrointestinal: Negative for nausea, vomiting, abdominal pain, diarrhea, constipation and blood in stool.  Genitourinary: Negative for dysuria, urgency, frequency, hematuria and flank pain.  Musculoskeletal: Positive for back pain (x 4 days after strain making up bed - no radiation,  improved with IB and robaxin). Negative for arthralgias, gait problem, joint swelling and myalgias.  Neurological: Negative for dizziness, weakness, light-headedness and headaches.  Psychiatric/Behavioral: Negative for dysphoric mood. The patient is not nervous/anxious.   All other systems reviewed and are negative.      Objective:   Physical Exam  BP 110/72  Pulse 77  Temp(Src) 98.4 F (36.9 C) (Oral)  Ht 5\' 2"  (1.575 m)  Wt 119 lb 3.2 oz (54.069 kg)  BMI 21.80 kg/m2  SpO2 96% Wt Readings from Last 3 Encounters:  11/08/13 119 lb 3.2 oz (54.069 kg)  11/04/12 121 lb (54.885 kg)  11/04/11 121 lb (54.885 kg)   Constitutional: She appears well-developed and well-nourished. No distress.  HENT: Head: Normocephalic and atraumatic. Ears: B TMs ok, no erythema or effusion; Nose: Nose normal. Mouth/Throat: Oropharynx is clear and moist. No oropharyngeal exudate.  Eyes: Conjunctivae and EOM are normal. Pupils are equal, round, and reactive to light. No scleral icterus.  Neck: Normal range of motion. Neck supple. No JVD present. No thyromegaly present.  Cardiovascular: Normal rate, regular rhythm and normal heart sounds.  No murmur heard. No BLE edema. Pulmonary/Chest: Effort normal  and breath sounds normal. No respiratory distress. She has no wheezes.  Abdominal: Soft. Bowel sounds are normal. She exhibits no distension. There is no tenderness. no masses Musculoskeletal: Back: full range of motion of thoracic and lumbar spine. Non tender to palpation. Negative straight leg raise. DTR's are symmetrically intact. Sensation intact in all dermatomes of the lower  extremities. Full strength to manual muscle testing. patient is able to heel toe walk without difficulty and ambulates with antalgic gait. Otherwise, Normal range of motion, no joint effusions. No gross deformities Neurological: She is alert and oriented to person, place, and time. No cranial nerve deficit. Coordination, balance, strength, speech and gait are normal.  Skin: Skin is warm and dry. No rash noted. No erythema.  Psychiatric: She has a normal mood and affect. Her behavior is normal. Judgment and thought content normal.    Lab Results  Component Value Date   WBC 6.7 11/04/2012   HGB 14.1 11/04/2012   HCT 41.5 11/04/2012   PLT 228.0 11/04/2012   GLUCOSE 90 11/04/2012   CHOL 205* 11/04/2012   TRIG 59.0 11/04/2012   HDL 68.50 11/04/2012   LDLDIRECT 126.8 11/04/2012   LDLCALC 116* 11/04/2011   ALT 22 11/04/2012   AST 21 11/04/2012   NA 139 11/04/2012   K 4.0 11/04/2012   CL 101 11/04/2012   CREATININE 1.0 11/04/2012   BUN 22 11/04/2012   CO2 31 11/04/2012   TSH 1.60 11/04/2012    Mm Digital Screening Bilateral  11/03/2013   CLINICAL DATA:  Screening.  EXAM: DIGITAL SCREENING BILATERAL MAMMOGRAM WITH CAD  COMPARISON:  Previous exam(s).  ACR Breast Density Category c: The breast tissue is heterogeneously dense, which may obscure small masses.  FINDINGS: There are no findings suspicious for malignancy. Images were processed with CAD.  IMPRESSION: No mammographic evidence of malignancy. A result letter of this screening mammogram will be mailed directly to the patient.  RECOMMENDATION: Screening mammogram in one year. (Code:[id number])  BI-RADS CATEGORY  1: Negative.   Electronically Signed   By: Lovey Newcomer M.D.   On: 11/03/2013 08:12       Assessment & Plan:   AWV/v70.0 - Today patient counseled on age appropriate routine health concerns for screening and prevention, each reviewed and up to date or declined. Immunizations reviewed and up to date or declined. Labs ordered and reviewed. Risk  factors for depression reviewed and negative. Hearing function and visual acuity are intact. ADLs screened and addressed as needed. Functional ability and level of safety reviewed and appropriate. Education, counseling and referrals performed based on assessed risks today. Patient provided with a copy of personalized plan for preventive services.  LBP <1 week - exam and history consistent with strain. Continue muscle relaxer and over-the-counter anti-inflammatory. Patient to continue home exercises as ongoing. Her symptoms have improved with conservative care, no further evaluation indicated at this time. Patient agrees to call if symptoms worse, unimproved, or recurrent  Problem List Items Addressed This Visit   HYPERLIPIDEMIA     Hx same but never on rx med - check lipids now     HYPOTHYROIDISM      The current medical regimen is effective;  continue present plan and medications. Check tsh today, adjust as needed Lab Results  Component Value Date   TSH 1.60 11/04/2012      OSTEOPENIA - Primary     dexa 10/2009 and 10/2011 reviewed -stable, mild due for 2 year follow up now, will schedule continue vit D +  ca and WB activity as ongoing

## 2013-11-08 NOTE — Patient Instructions (Addendum)
It was good to see you today.  We have reviewed your prior records including labs and tests today  Health Maintenance reviewed - tetanus updated today. All other recommended immunizations and age-appropriate screenings are up-to-date.  Test(s) ordered today. Your results will be released to MyChart (or called to you) after review, usually within 72hours after test completion. If any changes need to be made, you will be notified at that same time.  Medications reviewed and updated, no changes recommended at this time. Refill on medication(s) as discussed today.  we'll make referral for bone density scan to be scheduled. Our office will contact you regarding appointment(s) once made.  Please schedule followup in 12 months for annual exam and labs, call sooner if problems.  Health Maintenance, Female A healthy lifestyle and preventative care can promote health and wellness.  Maintain regular health, dental, and eye exams.  Eat a healthy diet. Foods like vegetables, fruits, whole grains, low-fat dairy products, and lean protein foods contain the nutrients you need without too many calories. Decrease your intake of foods high in solid fats, added sugars, and salt. Get information about a proper diet from your caregiver, if necessary.  Regular physical exercise is one of the most important things you can do for your health. Most adults should get at least 150 minutes of moderate-intensity exercise (any activity that increases your heart rate and causes you to sweat) each week. In addition, most adults need muscle-strengthening exercises on 2 or more days a week.   Maintain a healthy weight. The body mass index (BMI) is a screening tool to identify possible weight problems. It provides an estimate of body fat based on height and weight. Your caregiver can help determine your BMI, and can help you achieve or maintain a healthy weight. For adults 20 years and older:  A BMI below 18.5 is considered  underweight.  A BMI of 18.5 to 24.9 is normal.  A BMI of 25 to 29.9 is considered overweight.  A BMI of 30 and above is considered obese.  Maintain normal blood lipids and cholesterol by exercising and minimizing your intake of saturated fat. Eat a balanced diet with plenty of fruits and vegetables. Blood tests for lipids and cholesterol should begin at age 25 and be repeated every 5 years. If your lipid or cholesterol levels are high, you are over 50, or you are a high risk for heart disease, you may need your cholesterol levels checked more frequently.Ongoing high lipid and cholesterol levels should be treated with medicines if diet and exercise are not effective.  If you smoke, find out from your caregiver how to quit. If you do not use tobacco, do not start.  Lung cancer screening is recommended for adults aged 40 80 years who are at high risk for developing lung cancer because of a history of smoking. Yearly low-dose computed tomography (CT) is recommended for people who have at least a 30-pack-year history of smoking and are a current smoker or have quit within the past 15 years. A pack year of smoking is smoking an average of 1 pack of cigarettes a day for 1 year (for example: 1 pack a day for 30 years or 2 packs a day for 15 years). Yearly screening should continue until the smoker has stopped smoking for at least 15 years. Yearly screening should also be stopped for people who develop a health problem that would prevent them from having lung cancer treatment.  If you are pregnant, do not drink  alcohol. If you are breastfeeding, be very cautious about drinking alcohol. If you are not pregnant and choose to drink alcohol, do not exceed 1 drink per day. One drink is considered to be 12 ounces (355 mL) of beer, 5 ounces (148 mL) of wine, or 1.5 ounces (44 mL) of liquor.  Avoid use of street drugs. Do not share needles with anyone. Ask for help if you need support or instructions about stopping  the use of drugs.  High blood pressure causes heart disease and increases the risk of stroke. Blood pressure should be checked at least every 1 to 2 years. Ongoing high blood pressure should be treated with medicines, if weight loss and exercise are not effective.  If you are 20 to 71 years old, ask your caregiver if you should take aspirin to prevent strokes.  Diabetes screening involves taking a blood sample to check your fasting blood sugar level. This should be done once every 3 years, after age 59, if you are within normal weight and without risk factors for diabetes. Testing should be considered at a younger age or be carried out more frequently if you are overweight and have at least 1 risk factor for diabetes.  Breast cancer screening is essential preventative care for women. You should practice "breast self-awareness." This means understanding the normal appearance and feel of your breasts and may include breast self-examination. Any changes detected, no matter how small, should be reported to a caregiver. Women in their 10s and 30s should have a clinical breast exam (CBE) by a caregiver as part of a regular health exam every 1 to 3 years. After age 95, women should have a CBE every year. Starting at age 4, women should consider having a mammogram (breast X-ray) every year. Women who have a family history of breast cancer should talk to their caregiver about genetic screening. Women at a high risk of breast cancer should talk to their caregiver about having an MRI and a mammogram every year.  Breast cancer gene (BRCA)-related cancer risk assessment is recommended for women who have family members with BRCA-related cancers. BRCA-related cancers include breast, ovarian, tubal, and peritoneal cancers. Having family members with these cancers may be associated with an increased risk for harmful changes (mutations) in the breast cancer genes BRCA1 and BRCA2. Results of the assessment will determine  the need for genetic counseling and BRCA1 and BRCA2 testing.  The Pap test is a screening test for cervical cancer. Women should have a Pap test starting at age 30. Between ages 12 and 34, Pap tests should be repeated every 2 years. Beginning at age 56, you should have a Pap test every 3 years as long as the past 3 Pap tests have been normal. If you had a hysterectomy for a problem that was not cancer or a condition that could lead to cancer, then you no longer need Pap tests. If you are between ages 34 and 57, and you have had normal Pap tests going back 10 years, you no longer need Pap tests. If you have had past treatment for cervical cancer or a condition that could lead to cancer, you need Pap tests and screening for cancer for at least 20 years after your treatment. If Pap tests have been discontinued, risk factors (such as a new sexual partner) need to be reassessed to determine if screening should be resumed. Some women have medical problems that increase the chance of getting cervical cancer. In these cases, your caregiver may  recommend more frequent screening and Pap tests.  The human papillomavirus (HPV) test is an additional test that may be used for cervical cancer screening. The HPV test looks for the virus that can cause the cell changes on the cervix. The cells collected during the Pap test can be tested for HPV. The HPV test could be used to screen women aged 29 years and older, and should be used in women of any age who have unclear Pap test results. After the age of 83, women should have HPV testing at the same frequency as a Pap test.  Colorectal cancer can be detected and often prevented. Most routine colorectal cancer screening begins at the age of 33 and continues through age 50. However, your caregiver may recommend screening at an earlier age if you have risk factors for colon cancer. On a yearly basis, your caregiver may provide home test kits to check for hidden blood in the stool.  Use of a small camera at the end of a tube, to directly examine the colon (sigmoidoscopy or colonoscopy), can detect the earliest forms of colorectal cancer. Talk to your caregiver about this at age 54, when routine screening begins. Direct examination of the colon should be repeated every 5 to 10 years through age 9, unless early forms of pre-cancerous polyps or small growths are found.  Hepatitis C blood testing is recommended for all people born from [birth year] through 1963 and any individual with known risks for hepatitis C.  Practice safe sex. Use condoms and avoid high-risk sexual practices to reduce the spread of sexually transmitted infections (STIs). Sexually active women aged 25 and younger should be checked for Chlamydia, which is a common sexually transmitted infection. Older women with new or multiple partners should also be tested for Chlamydia. Testing for other STIs is recommended if you are sexually active and at increased risk.  Osteoporosis is a disease in which the bones lose minerals and strength with aging. This can result in serious bone fractures. The risk of osteoporosis can be identified using a bone density scan. Women ages 87 and over and women at risk for fractures or osteoporosis should discuss screening with their caregivers. Ask your caregiver whether you should be taking a calcium supplement or vitamin D to reduce the rate of osteoporosis.  Menopause can be associated with physical symptoms and risks. Hormone replacement therapy is available to decrease symptoms and risks. You should talk to your caregiver about whether hormone replacement therapy is right for you.  Use sunscreen. Apply sunscreen liberally and repeatedly throughout the day. You should seek shade when your shadow is shorter than you. Protect yourself by wearing long sleeves, pants, a wide-brimmed hat, and sunglasses year round, whenever you are outdoors.  Notify your caregiver of new moles or changes in moles,  especially if there is a change in shape or color. Also notify your caregiver if a mole is larger than the size of a pencil eraser.  Stay current with your immunizations. Document Released: 01/07/2011 Document Revised: 10/19/2012 Document Reviewed: 01/07/2011 [facility] Patient Information 2014 [place], [place]. Back Exercises Back exercises help treat and prevent back injuries. The goal is to increase your strength in your belly (abdominal) and back muscles. These exercises can also help with flexibility. Start these exercises when told by your doctor. HOME CARE Back exercises include: Pelvic Tilt.  Lie on your back with your knees bent. Tilt your pelvis until the lower part of your back is against the floor. Hold this position  5 to 10 sec. Repeat this exercise 5 to 10 times. Knee to Chest.  Pull 1 knee up against your chest and hold for 20 to 30 seconds. Repeat this with the other knee. This may be done with the other leg straight or bent, whichever feels better. Then, pull both knees up against your chest. Sit-Ups or Curl-Ups.  Bend your knees 90 degrees. Start with tilting your pelvis, and do a partial, slow sit-up. Only lift your upper half 30 to 45 degrees off the floor. Take at least 2 to 3 seonds for each sit-up. Do not do sit-ups with your knees out straight. If partial sit-ups are difficult, simply do the above but with only tightening your belly (abdominal) muscles and holding it as told. Hip-Lift.  Lie on your back with your knees flexed 90 degrees. Push down with your feet and shoulders as you raise your hips 2 inches off the floor. Hold for 10 seconds, repeat 5 to 10 times. Back Arches.  Lie on your stomach. Prop yourself up on bent elbows. Slowly press on your hands, causing an arch in your low back. Repeat 3 to 5 times. Shoulder-Lifts.  Lie face down with arms beside your body. Keep hips and belly pressed to floor as you slowly lift your head and shoulders off the floor. Do  not overdo your exercises. Be careful in the beginning. Exercises may cause you some mild back discomfort. If the pain lasts for more than 15 minutes, stop the exercises until you see your doctor. Improvement with exercise for back problems is slow.  Document Released: 07/27/2010 Document Revised: 09/16/2011 Document Reviewed: 04/25/2011 [facility] Patient Information 2014 [place], [place].

## 2013-11-08 NOTE — Assessment & Plan Note (Signed)
dexa 10/2009 and 10/2011 reviewed -stable, mild due for 2 year follow up now, will schedule continue vit D + ca and WB activity as ongoing

## 2013-11-08 NOTE — Assessment & Plan Note (Signed)
The current medical regimen is effective;  continue present plan and medications. Check tsh today, adjust as needed Lab Results  Component Value Date   TSH 1.60 11/04/2012

## 2013-11-08 NOTE — Progress Notes (Signed)
Pre visit review using our clinic review tool, if applicable. No additional management support is needed unless otherwise documented below in the visit note. 

## 2013-11-08 NOTE — Assessment & Plan Note (Signed)
Hx same but never on rx med - check lipids now

## 2013-11-15 ENCOUNTER — Encounter: Payer: Self-pay | Admitting: Internal Medicine

## 2013-11-18 DIAGNOSIS — M47817 Spondylosis without myelopathy or radiculopathy, lumbosacral region: Secondary | ICD-10-CM | POA: Diagnosis not present

## 2013-12-23 DIAGNOSIS — R51 Headache: Secondary | ICD-10-CM | POA: Diagnosis not present

## 2014-01-04 DIAGNOSIS — H905 Unspecified sensorineural hearing loss: Secondary | ICD-10-CM | POA: Diagnosis not present

## 2014-01-04 DIAGNOSIS — H8109 Meniere's disease, unspecified ear: Secondary | ICD-10-CM | POA: Diagnosis not present

## 2014-03-04 DIAGNOSIS — H04129 Dry eye syndrome of unspecified lacrimal gland: Secondary | ICD-10-CM | POA: Diagnosis not present

## 2014-03-04 DIAGNOSIS — H2510 Age-related nuclear cataract, unspecified eye: Secondary | ICD-10-CM | POA: Diagnosis not present

## 2014-04-11 DIAGNOSIS — H04222 Epiphora due to insufficient drainage, left lacrimal gland: Secondary | ICD-10-CM | POA: Diagnosis not present

## 2014-04-25 DIAGNOSIS — Z23 Encounter for immunization: Secondary | ICD-10-CM | POA: Diagnosis not present

## 2014-05-02 DIAGNOSIS — H04212 Epiphora due to excess lacrimation, left lacrimal gland: Secondary | ICD-10-CM | POA: Diagnosis not present

## 2014-05-02 DIAGNOSIS — H01006 Unspecified blepharitis left eye, unspecified eyelid: Secondary | ICD-10-CM | POA: Diagnosis not present

## 2014-06-24 DIAGNOSIS — M5442 Lumbago with sciatica, left side: Secondary | ICD-10-CM | POA: Diagnosis not present

## 2014-07-12 DIAGNOSIS — H8101 Meniere's disease, right ear: Secondary | ICD-10-CM | POA: Diagnosis not present

## 2014-07-12 DIAGNOSIS — H903 Sensorineural hearing loss, bilateral: Secondary | ICD-10-CM | POA: Diagnosis not present

## 2014-07-20 DIAGNOSIS — M533 Sacrococcygeal disorders, not elsewhere classified: Secondary | ICD-10-CM | POA: Diagnosis not present

## 2014-08-03 DIAGNOSIS — M4726 Other spondylosis with radiculopathy, lumbar region: Secondary | ICD-10-CM | POA: Diagnosis not present

## 2014-08-03 DIAGNOSIS — M5442 Lumbago with sciatica, left side: Secondary | ICD-10-CM | POA: Diagnosis not present

## 2014-08-10 DIAGNOSIS — M5442 Lumbago with sciatica, left side: Secondary | ICD-10-CM | POA: Diagnosis not present

## 2014-08-16 DIAGNOSIS — M5126 Other intervertebral disc displacement, lumbar region: Secondary | ICD-10-CM | POA: Diagnosis not present

## 2014-08-16 DIAGNOSIS — M5442 Lumbago with sciatica, left side: Secondary | ICD-10-CM | POA: Diagnosis not present

## 2014-08-26 DIAGNOSIS — H903 Sensorineural hearing loss, bilateral: Secondary | ICD-10-CM | POA: Diagnosis not present

## 2014-08-26 DIAGNOSIS — H8101 Meniere's disease, right ear: Secondary | ICD-10-CM | POA: Diagnosis not present

## 2014-09-06 DIAGNOSIS — M47816 Spondylosis without myelopathy or radiculopathy, lumbar region: Secondary | ICD-10-CM | POA: Insufficient documentation

## 2014-09-06 DIAGNOSIS — M5136 Other intervertebral disc degeneration, lumbar region: Secondary | ICD-10-CM | POA: Diagnosis not present

## 2014-09-06 DIAGNOSIS — M4806 Spinal stenosis, lumbar region: Secondary | ICD-10-CM | POA: Diagnosis not present

## 2014-09-06 DIAGNOSIS — M419 Scoliosis, unspecified: Secondary | ICD-10-CM | POA: Insufficient documentation

## 2014-09-06 DIAGNOSIS — M549 Dorsalgia, unspecified: Secondary | ICD-10-CM | POA: Diagnosis not present

## 2014-09-06 DIAGNOSIS — M4155 Other secondary scoliosis, thoracolumbar region: Secondary | ICD-10-CM | POA: Diagnosis not present

## 2014-09-06 DIAGNOSIS — M4726 Other spondylosis with radiculopathy, lumbar region: Secondary | ICD-10-CM | POA: Diagnosis not present

## 2014-09-06 DIAGNOSIS — M51369 Other intervertebral disc degeneration, lumbar region without mention of lumbar back pain or lower extremity pain: Secondary | ICD-10-CM | POA: Insufficient documentation

## 2014-09-14 DIAGNOSIS — M4806 Spinal stenosis, lumbar region: Secondary | ICD-10-CM | POA: Diagnosis not present

## 2014-09-14 DIAGNOSIS — M4316 Spondylolisthesis, lumbar region: Secondary | ICD-10-CM | POA: Diagnosis not present

## 2014-09-14 DIAGNOSIS — M545 Low back pain: Secondary | ICD-10-CM | POA: Diagnosis not present

## 2014-09-14 DIAGNOSIS — M5136 Other intervertebral disc degeneration, lumbar region: Secondary | ICD-10-CM | POA: Diagnosis not present

## 2014-09-14 DIAGNOSIS — M4726 Other spondylosis with radiculopathy, lumbar region: Secondary | ICD-10-CM | POA: Diagnosis not present

## 2014-10-07 DIAGNOSIS — H6993 Unspecified Eustachian tube disorder, bilateral: Secondary | ICD-10-CM | POA: Diagnosis not present

## 2014-10-07 DIAGNOSIS — J310 Chronic rhinitis: Secondary | ICD-10-CM | POA: Diagnosis not present

## 2014-10-07 DIAGNOSIS — H8101 Meniere's disease, right ear: Secondary | ICD-10-CM | POA: Diagnosis not present

## 2014-10-07 DIAGNOSIS — H9209 Otalgia, unspecified ear: Secondary | ICD-10-CM | POA: Diagnosis not present

## 2014-10-18 DIAGNOSIS — M4806 Spinal stenosis, lumbar region: Secondary | ICD-10-CM | POA: Diagnosis not present

## 2014-10-18 DIAGNOSIS — M5136 Other intervertebral disc degeneration, lumbar region: Secondary | ICD-10-CM | POA: Diagnosis not present

## 2014-10-18 DIAGNOSIS — M4726 Other spondylosis with radiculopathy, lumbar region: Secondary | ICD-10-CM | POA: Diagnosis not present

## 2014-10-24 DIAGNOSIS — H8101 Meniere's disease, right ear: Secondary | ICD-10-CM | POA: Diagnosis not present

## 2014-10-24 DIAGNOSIS — H9209 Otalgia, unspecified ear: Secondary | ICD-10-CM | POA: Diagnosis not present

## 2014-10-24 DIAGNOSIS — H903 Sensorineural hearing loss, bilateral: Secondary | ICD-10-CM | POA: Diagnosis not present

## 2014-11-01 DIAGNOSIS — H15111 Episcleritis periodica fugax, right eye: Secondary | ICD-10-CM | POA: Diagnosis not present

## 2014-11-04 DIAGNOSIS — Z124 Encounter for screening for malignant neoplasm of cervix: Secondary | ICD-10-CM | POA: Diagnosis not present

## 2014-11-04 DIAGNOSIS — Z1231 Encounter for screening mammogram for malignant neoplasm of breast: Secondary | ICD-10-CM | POA: Diagnosis not present

## 2014-11-04 DIAGNOSIS — Z01419 Encounter for gynecological examination (general) (routine) without abnormal findings: Secondary | ICD-10-CM | POA: Diagnosis not present

## 2014-11-06 DIAGNOSIS — R768 Other specified abnormal immunological findings in serum: Secondary | ICD-10-CM

## 2014-11-06 HISTORY — DX: Other specified abnormal immunological findings in serum: R76.8

## 2014-11-11 ENCOUNTER — Encounter: Payer: Medicare Other | Admitting: Internal Medicine

## 2014-11-14 DIAGNOSIS — H9122 Sudden idiopathic hearing loss, left ear: Secondary | ICD-10-CM | POA: Diagnosis not present

## 2014-11-14 DIAGNOSIS — H8103 Meniere's disease, bilateral: Secondary | ICD-10-CM | POA: Diagnosis not present

## 2014-11-15 DIAGNOSIS — H15111 Episcleritis periodica fugax, right eye: Secondary | ICD-10-CM | POA: Diagnosis not present

## 2014-11-25 DIAGNOSIS — H8103 Meniere's disease, bilateral: Secondary | ICD-10-CM | POA: Diagnosis not present

## 2014-11-25 DIAGNOSIS — H903 Sensorineural hearing loss, bilateral: Secondary | ICD-10-CM | POA: Diagnosis not present

## 2014-12-07 ENCOUNTER — Other Ambulatory Visit (INDEPENDENT_AMBULATORY_CARE_PROVIDER_SITE_OTHER): Payer: Medicare Other

## 2014-12-07 ENCOUNTER — Ambulatory Visit (INDEPENDENT_AMBULATORY_CARE_PROVIDER_SITE_OTHER): Payer: Medicare Other | Admitting: Internal Medicine

## 2014-12-07 ENCOUNTER — Encounter: Payer: Self-pay | Admitting: Internal Medicine

## 2014-12-07 VITALS — BP 128/78 | HR 78 | Temp 97.9°F | Ht 62.0 in | Wt 119.8 lb

## 2014-12-07 DIAGNOSIS — E785 Hyperlipidemia, unspecified: Secondary | ICD-10-CM | POA: Diagnosis not present

## 2014-12-07 DIAGNOSIS — B1710 Acute hepatitis C without hepatic coma: Secondary | ICD-10-CM | POA: Diagnosis not present

## 2014-12-07 DIAGNOSIS — E039 Hypothyroidism, unspecified: Secondary | ICD-10-CM | POA: Diagnosis not present

## 2014-12-07 DIAGNOSIS — R894 Abnormal immunological findings in specimens from other organs, systems and tissues: Secondary | ICD-10-CM | POA: Diagnosis not present

## 2014-12-07 DIAGNOSIS — R768 Other specified abnormal immunological findings in serum: Secondary | ICD-10-CM | POA: Insufficient documentation

## 2014-12-07 LAB — BASIC METABOLIC PANEL
BUN: 19 mg/dL (ref 6–23)
CHLORIDE: 102 meq/L (ref 96–112)
CO2: 32 meq/L (ref 19–32)
Calcium: 9.3 mg/dL (ref 8.4–10.5)
Creatinine, Ser: 0.97 mg/dL (ref 0.40–1.20)
GFR: 60.06 mL/min (ref >60.00)
Glucose, Bld: 94 mg/dL (ref 70–99)
POTASSIUM: 3.9 meq/L (ref 3.5–5.1)
SODIUM: 139 meq/L (ref 135–145)

## 2014-12-07 LAB — CBC WITH DIFFERENTIAL/PLATELET
BASOS ABS: 0 10*3/uL (ref 0.0–0.1)
BASOS PCT: 0.6 % (ref 0.0–3.0)
Eosinophils Absolute: 0 10*3/uL (ref 0.0–0.7)
Eosinophils Relative: 0.8 % (ref 0.0–5.0)
HEMATOCRIT: 37 % (ref 36.0–46.0)
Hemoglobin: 12.5 g/dL (ref 12.0–15.0)
LYMPHS ABS: 1.2 10*3/uL (ref 0.7–4.0)
LYMPHS PCT: 22 % (ref 12.0–46.0)
MCHC: 33.8 g/dL (ref 30.0–36.0)
MCV: 93.9 fl (ref 78.0–100.0)
MONO ABS: 0.4 10*3/uL (ref 0.1–1.0)
MONOS PCT: 6.9 % (ref 3.0–12.0)
NEUTROS PCT: 69.7 % (ref 43.0–77.0)
Neutro Abs: 3.8 10*3/uL (ref 1.4–7.7)
PLATELETS: 222 10*3/uL (ref 150.0–400.0)
RBC: 3.94 Mil/uL (ref 3.87–5.11)
RDW: 13.8 % (ref 11.5–15.5)
WBC: 5.5 10*3/uL (ref 4.0–10.5)

## 2014-12-07 LAB — URINALYSIS, ROUTINE W REFLEX MICROSCOPIC
Bilirubin Urine: NEGATIVE
HGB URINE DIPSTICK: NEGATIVE
Ketones, ur: NEGATIVE
Leukocytes, UA: NEGATIVE
NITRITE: NEGATIVE
PH: 6.5 (ref 5.0–8.0)
RBC / HPF: NONE SEEN (ref 0–?)
SPECIFIC GRAVITY, URINE: 1.015 (ref 1.000–1.030)
TOTAL PROTEIN, URINE-UPE24: NEGATIVE
URINE GLUCOSE: NEGATIVE
UROBILINOGEN UA: 0.2 (ref 0.0–1.0)
WBC, UA: NONE SEEN (ref 0–?)

## 2014-12-07 LAB — HEPATIC FUNCTION PANEL
ALBUMIN: 4 g/dL (ref 3.5–5.2)
ALT: 17 U/L (ref 0–35)
AST: 16 U/L (ref 0–37)
Alkaline Phosphatase: 38 U/L — ABNORMAL LOW (ref 39–117)
BILIRUBIN DIRECT: 0.1 mg/dL (ref 0.0–0.3)
BILIRUBIN TOTAL: 0.6 mg/dL (ref 0.2–1.2)
Total Protein: 6.7 g/dL (ref 6.0–8.3)

## 2014-12-07 LAB — TSH: TSH: 1.69 u[IU]/mL (ref 0.35–4.50)

## 2014-12-07 LAB — LIPID PANEL
CHOL/HDL RATIO: 3
Cholesterol: 222 mg/dL — ABNORMAL HIGH (ref 0–200)
HDL: 75.6 mg/dL (ref >39.00)
LDL Cholesterol: 129 mg/dL — ABNORMAL HIGH (ref 0–99)
NONHDL: 146.4
TRIGLYCERIDES: 85 mg/dL (ref 0.0–149.0)
VLDL: 17 mg/dL (ref 0.0–40.0)

## 2014-12-07 NOTE — Progress Notes (Signed)
Pre visit review using our clinic review tool, if applicable. No additional management support is needed unless otherwise documented below in the visit note. 

## 2014-12-07 NOTE — Assessment & Plan Note (Signed)
Hx same but never on rx med - check lipids anually

## 2014-12-07 NOTE — Patient Instructions (Signed)
It was good to see you today.  We have reviewed your prior records including labs and tests today  Test(s) ordered today. Your results will be released to [facility] (or called to you) after review, usually within 72hours after test completion. If any changes need to be made, you will be notified at that same time.  Medications reviewed and updated, no changes recommended at this time. Refill on medication(s) as discussed today.  we'll make referral to hepatitis specialist if needed. Will review further after test results are final

## 2014-12-07 NOTE — Assessment & Plan Note (Signed)
The current medical regimen is effective;  continue present plan and medications. Check tsh today, adjust as needed Lab Results  Component Value Date   TSH 1.16 11/08/2013

## 2014-12-07 NOTE — Progress Notes (Signed)
Subjective:    Patient ID: Holly Richard, female    DOB: 1940/12/27, 74 y.o.   MRN: [id number]  HPI  Patient here for follow-up on potential test abnormality. Patient received letter from [facility] 11/25/2014 after blood donation stating she is likely infected with hepatitis C. Patient donates blood twice each year for the last several years through church donation drive. Last donation (before most recent 11/15/2014) was on 07/19/2014. Patient has never before received notification of abnormal test results or hepatitis C. Patient has not had surgery but notes to start injections into back January 13 with Dr. Nelva Bush and March 9 with Dr. Rita Ohara. Never has received blood transfusions in her life. No history of IV drug use. No other surgery. No new sexual partners, monogamous with single partner for life. No history of symptomatic hepatitis  Past Medical History  Diagnosis Date  . MIGRAINE HEADACHE   . MENIERE'S DISEASE   . DIVERTICULOSIS, COLON   . ARTHRITIS   . OSTEOPENIA   . ALLERGIC RHINITIS   . HYPERLIPIDEMIA   . HYPOTHYROIDISM     Review of Systems  Constitutional: Negative for fever, fatigue and unexpected weight change.  Respiratory: Negative for cough and shortness of breath.   Cardiovascular: Negative for chest pain and leg swelling.  Gastrointestinal: Negative for nausea, vomiting, abdominal pain and abdominal distention.       Objective:    Physical Exam  Constitutional: She appears well-developed and well-nourished. No distress.  Cardiovascular: Normal rate, regular rhythm and normal heart sounds.   No murmur heard. Pulmonary/Chest: Effort normal and breath sounds normal. No respiratory distress.  Abdominal: Soft. Bowel sounds are normal. She exhibits no distension and no mass. There is no tenderness. There is no rebound and no guarding.  Musculoskeletal: She exhibits no edema.    BP 128/78 mmHg  Pulse 78  Temp(Src) 97.9 F (36.6 C) (Oral)  Ht 5\' 2"  (1.575 m)   Wt 119 lb 12 oz (54.318 kg)  BMI 21.90 kg/m2  SpO2 97% Wt Readings from Last 3 Encounters:  12/07/14 119 lb 12 oz (54.318 kg)  11/08/13 119 lb 3.2 oz (54.069 kg)  11/04/12 121 lb (54.885 kg)     Lab Results  Component Value Date   WBC 6.7 11/04/2012   HGB 14.1 11/04/2012   HCT 41.5 11/04/2012   PLT 228.0 11/04/2012   GLUCOSE 85 11/08/2013   CHOL 192 11/08/2013   TRIG 67.0 11/08/2013   HDL 61.90 11/08/2013   LDLDIRECT 126.8 11/04/2012   LDLCALC 117* 11/08/2013   ALT 16 11/08/2013   AST 18 11/08/2013   NA 139 11/08/2013   K 3.4* 11/08/2013   CL 102 11/08/2013   CREATININE 0.9 11/08/2013   BUN 24* 11/08/2013   CO2 29 11/08/2013   TSH 1.16 11/08/2013    Dg Bone Density  11/14/2013   Findings : lowest T score - 1.2 @  femoral neck  FRAX risk @ hip / spine low Diagnosis:  Osteopenia See Recommendations       Assessment & Plan:   Positive hepatitis C antibody. Recent notification of same by [facility] after voluntary blood donation May 10. Recheck antibody now with RNA titer. Refer to infectious disease for further evaluation if positive. Patient education provided at length today. Copy of [employer] [facility] notification letter scanned into electronic record  Problem List Items Addressed This Visit    Hepatitis C antibody test positive - Primary   Relevant Orders   Hepatitis C antibody  Hepatitis C RNA quantitative   Basic metabolic panel   CBC with Differential/Platelet   Hepatic function panel   Urinalysis, Routine w reflex microscopic (not at [facility])   Hyperlipidemia    Hx same but never on rx med - check lipids anually      Relevant Orders   Lipid panel   Urinalysis, Routine w reflex microscopic (not at [facility])   Hypothyroidism    The current medical regimen is effective;  continue present plan and medications. Check tsh today, adjust as needed Lab Results  Component Value Date   TSH 1.16 11/08/2013        Relevant Orders   TSH   Urinalysis, Routine w reflex  microscopic (not at [facility])       Gwendolyn Grant, MD

## 2014-12-08 LAB — HEPATITIS C RNA QUANTITATIVE: HCV QUANT: NOT DETECTED [IU]/mL (ref <15)

## 2014-12-08 LAB — HEPATITIS C ANTIBODY: HCV AB: REACTIVE — AB

## 2014-12-12 ENCOUNTER — Telehealth: Payer: Self-pay | Admitting: Internal Medicine

## 2014-12-12 NOTE — Telephone Encounter (Signed)
Patient is returning your call. She will be there till 1, so if you can call her back before then.

## 2014-12-13 ENCOUNTER — Other Ambulatory Visit: Payer: Self-pay | Admitting: Internal Medicine

## 2014-12-19 ENCOUNTER — Other Ambulatory Visit: Payer: Self-pay | Admitting: Neurosurgery

## 2014-12-19 DIAGNOSIS — M4726 Other spondylosis with radiculopathy, lumbar region: Secondary | ICD-10-CM | POA: Diagnosis not present

## 2014-12-19 DIAGNOSIS — M7138 Other bursal cyst, other site: Secondary | ICD-10-CM | POA: Diagnosis not present

## 2014-12-19 DIAGNOSIS — M5136 Other intervertebral disc degeneration, lumbar region: Secondary | ICD-10-CM | POA: Diagnosis not present

## 2014-12-19 DIAGNOSIS — M4806 Spinal stenosis, lumbar region: Secondary | ICD-10-CM | POA: Diagnosis not present

## 2014-12-23 ENCOUNTER — Encounter (HOSPITAL_COMMUNITY): Payer: Self-pay

## 2014-12-23 ENCOUNTER — Encounter (HOSPITAL_COMMUNITY)
Admission: RE | Admit: 2014-12-23 | Discharge: 2014-12-23 | Disposition: A | Discharge status: Home / Self Care | Payer: Medicare Other | Source: Ambulatory Visit | Attending: Neurosurgery | Admitting: Neurosurgery

## 2014-12-23 DIAGNOSIS — M7130 Other bursal cyst, unspecified site: Secondary | ICD-10-CM | POA: Diagnosis not present

## 2014-12-23 DIAGNOSIS — M5126 Other intervertebral disc displacement, lumbar region: Secondary | ICD-10-CM | POA: Insufficient documentation

## 2014-12-23 DIAGNOSIS — Z01812 Encounter for preprocedural laboratory examination: Secondary | ICD-10-CM | POA: Diagnosis not present

## 2014-12-23 LAB — CBC
HCT: 37.1 % (ref 36.0–46.0)
Hemoglobin: 12.5 g/dL (ref 12.0–15.0)
MCH: 31.2 pg (ref 26.0–34.0)
MCHC: 33.7 g/dL (ref 30.0–36.0)
MCV: 92.5 fL (ref 78.0–100.0)
Platelets: 341 10*3/uL (ref 150–400)
RBC: 4.01 MIL/uL (ref 3.87–5.11)
RDW: 13.2 % (ref 11.5–15.5)
WBC: 6.4 10*3/uL (ref 4.0–10.5)

## 2014-12-23 LAB — SURGICAL PCR SCREEN
MRSA, PCR: NEGATIVE
Staphylococcus aureus: NEGATIVE

## 2014-12-23 LAB — COMPREHENSIVE METABOLIC PANEL
ALT: 17 U/L (ref 14–54)
AST: 19 U/L (ref 15–41)
Albumin: 3.6 g/dL (ref 3.5–5.0)
Alkaline Phosphatase: 51 U/L (ref 38–126)
Anion gap: 11 (ref 5–15)
BUN: 20 mg/dL (ref 6–20)
CO2: 28 mmol/L (ref 22–32)
Calcium: 9.6 mg/dL (ref 8.9–10.3)
Chloride: 102 mmol/L (ref 101–111)
Creatinine, Ser: 1.09 mg/dL — ABNORMAL HIGH (ref 0.44–1.00)
GFR calc Af Amer: 58 mL/min — ABNORMAL LOW (ref >60)
GFR calc non Af Amer: 50 mL/min — ABNORMAL LOW (ref >60)
Glucose, Bld: 105 mg/dL — ABNORMAL HIGH (ref 65–99)
Potassium: 3.2 mmol/L — ABNORMAL LOW (ref 3.5–5.1)
Sodium: 141 mmol/L (ref 135–145)
Total Bilirubin: 0.5 mg/dL (ref 0.3–1.2)
Total Protein: 6.5 g/dL (ref 6.5–8.1)

## 2014-12-23 NOTE — Pre-Procedure Instructions (Signed)
    Holly Richard  12/23/2014      [name], [name] [address] [name] [name] [address] Phone: [phone] Fax: [phone]    Your procedure is scheduled on 12/28/14.  Report to [facility] Admitting at 630 A.M.  Call this number if you have problems the morning of surgery:  [phone]   Remember:  Do not eat food or drink liquids after midnight.  Take these medicines the morning of surgery with A SIP OF WATER tylenol,flonase,synthroid,eye drops   Do not wear jewelry, make-up or nail polish.  Do not wear lotions, powders, or perfumes.  You may wear deodorant.  Do not shave 48 hours prior to surgery.  Men may shave face and neck.  Do not bring valuables to the hospital.  [facility] is not responsible for any belongings or valuables.  Contacts, dentures or bridgework may not be worn into surgery.  Leave your suitcase in the car.  After surgery it may be brought to your room.  For patients admitted to the hospital, discharge time will be determined by your treatment team.  Patients discharged the day of surgery will not be allowed to drive home.   Name and phone number of your driver:    Special instructions:    Please read over the following fact sheets that you were given. Pain Booklet, Coughing and Deep Breathing, MRSA Information and Surgical Site Infection Prevention

## 2014-12-27 MED ORDER — CEFAZOLIN SODIUM-DEXTROSE 2-3 GM-% IV SOLR
2.0000 g | INTRAVENOUS | Status: AC
  Administered 2014-12-28: 2 g via INTRAVENOUS
  Filled 2014-12-27: qty 50

## 2014-12-28 ENCOUNTER — Encounter (HOSPITAL_COMMUNITY): Payer: Self-pay | Admitting: *Deleted

## 2014-12-28 ENCOUNTER — Inpatient Hospital Stay (HOSPITAL_COMMUNITY): Payer: Medicare Other

## 2014-12-28 ENCOUNTER — Inpatient Hospital Stay (HOSPITAL_COMMUNITY): Payer: Medicare Other | Admitting: Anesthesiology

## 2014-12-28 ENCOUNTER — Inpatient Hospital Stay (HOSPITAL_COMMUNITY)
Admission: RE | Admit: 2014-12-28 | Discharge: 2014-12-29 | DRG: 520 | Disposition: A | Discharge status: Home / Self Care | Payer: Medicare Other | Source: Ambulatory Visit | Attending: Neurosurgery | Admitting: Neurosurgery

## 2014-12-28 ENCOUNTER — Encounter (HOSPITAL_COMMUNITY)
Admission: RE | Disposition: A | Discharge status: Home / Self Care | Payer: Medicare Other | Source: Ambulatory Visit | Attending: Neurosurgery

## 2014-12-28 DIAGNOSIS — E039 Hypothyroidism, unspecified: Secondary | ICD-10-CM | POA: Diagnosis present

## 2014-12-28 DIAGNOSIS — M549 Dorsalgia, unspecified: Secondary | ICD-10-CM | POA: Diagnosis not present

## 2014-12-28 DIAGNOSIS — M4726 Other spondylosis with radiculopathy, lumbar region: Secondary | ICD-10-CM | POA: Diagnosis present

## 2014-12-28 DIAGNOSIS — Z7982 Long term (current) use of aspirin: Secondary | ICD-10-CM

## 2014-12-28 DIAGNOSIS — M7138 Other bursal cyst, other site: Secondary | ICD-10-CM | POA: Diagnosis present

## 2014-12-28 DIAGNOSIS — M48061 Spinal stenosis, lumbar region without neurogenic claudication: Secondary | ICD-10-CM | POA: Diagnosis present

## 2014-12-28 DIAGNOSIS — M8580 Other specified disorders of bone density and structure, unspecified site: Secondary | ICD-10-CM | POA: Diagnosis present

## 2014-12-28 DIAGNOSIS — Z419 Encounter for procedure for purposes other than remedying health state, unspecified: Secondary | ICD-10-CM

## 2014-12-28 DIAGNOSIS — M5116 Intervertebral disc disorders with radiculopathy, lumbar region: Secondary | ICD-10-CM | POA: Diagnosis not present

## 2014-12-28 DIAGNOSIS — M4806 Spinal stenosis, lumbar region: Secondary | ICD-10-CM | POA: Diagnosis not present

## 2014-12-28 DIAGNOSIS — M5126 Other intervertebral disc displacement, lumbar region: Secondary | ICD-10-CM | POA: Diagnosis not present

## 2014-12-28 DIAGNOSIS — E785 Hyperlipidemia, unspecified: Secondary | ICD-10-CM | POA: Diagnosis present

## 2014-12-28 DIAGNOSIS — Z9889 Other specified postprocedural states: Secondary | ICD-10-CM | POA: Diagnosis not present

## 2014-12-28 HISTORY — PX: LUMBAR LAMINECTOMY/DECOMPRESSION MICRODISCECTOMY: SHX5026

## 2014-12-28 SURGERY — LUMBAR LAMINECTOMY/DECOMPRESSION MICRODISCECTOMY 2 LEVELS
Anesthesia: General | Laterality: Left

## 2014-12-28 MED ORDER — HYDROXYZINE HCL 25 MG PO TABS: 50.0000 mg | ORAL_TABLET | ORAL | Status: DC | PRN

## 2014-12-28 MED ORDER — SURGIFOAM 100 EX MISC
CUTANEOUS | Status: DC | PRN
  Administered 2014-12-28: 20 mL via TOPICAL

## 2014-12-28 MED ORDER — FENTANYL CITRATE (PF) 100 MCG/2ML IJ SOLN
INTRAMUSCULAR | Status: DC | PRN
  Administered 2014-12-28: 100 ug via INTRAVENOUS

## 2014-12-28 MED ORDER — SODIUM CHLORIDE 0.9 % IJ SOLN: 3.0000 mL | INTRAMUSCULAR | Status: DC | PRN

## 2014-12-28 MED ORDER — FENTANYL CITRATE (PF) 250 MCG/5ML IJ SOLN
INTRAMUSCULAR | Status: AC
  Filled 2014-12-28: qty 5

## 2014-12-28 MED ORDER — GLYCOPYRROLATE 0.2 MG/ML IJ SOLN
INTRAMUSCULAR | Status: AC
  Filled 2014-12-28: qty 2

## 2014-12-28 MED ORDER — MENTHOL 3 MG MT LOZG: 1.0000 | LOZENGE | OROMUCOSAL | Status: DC | PRN

## 2014-12-28 MED ORDER — ACETAMINOPHEN 10 MG/ML IV SOLN
INTRAVENOUS | Status: AC
Start: 2014-12-28 — End: 2014-12-28
  Administered 2014-12-28: 1000 mg via INTRAVENOUS
  Filled 2014-12-28: qty 100

## 2014-12-28 MED ORDER — HYDROMORPHONE HCL 1 MG/ML IJ SOLN
0.5000 mg | INTRAMUSCULAR | Status: DC | PRN
  Administered 2014-12-28 (×2): 0.5 mg via INTRAVENOUS

## 2014-12-28 MED ORDER — PHENOL 1.4 % MT LIQD: 1.0000 | OROMUCOSAL | Status: DC | PRN

## 2014-12-28 MED ORDER — KETOROLAC TROMETHAMINE 30 MG/ML IJ SOLN
15.0000 mg | Freq: Once | INTRAMUSCULAR | Status: AC
  Administered 2014-12-28: 15 mg via INTRAVENOUS

## 2014-12-28 MED ORDER — PROPOFOL 10 MG/ML IV BOLUS
INTRAVENOUS | Status: AC
  Filled 2014-12-28: qty 20

## 2014-12-28 MED ORDER — HYDROCODONE-ACETAMINOPHEN 5-325 MG PO TABS: 1.0000 | ORAL_TABLET | ORAL | Status: DC | PRN

## 2014-12-28 MED ORDER — ARTIFICIAL TEARS OP OINT
TOPICAL_OINTMENT | OPHTHALMIC | Status: AC
  Filled 2014-12-28: qty 3.5

## 2014-12-28 MED ORDER — ACETAMINOPHEN 325 MG PO TABS: 650.0000 mg | ORAL_TABLET | ORAL | Status: DC | PRN

## 2014-12-28 MED ORDER — NEOSTIGMINE METHYLSULFATE 10 MG/10ML IV SOLN
INTRAVENOUS | Status: DC | PRN
  Administered 2014-12-28: 2 mg via INTRAVENOUS

## 2014-12-28 MED ORDER — CYCLOBENZAPRINE HCL 10 MG PO TABS: 10.0000 mg | ORAL_TABLET | Freq: Three times a day (TID) | ORAL | Status: DC | PRN

## 2014-12-28 MED ORDER — ALUM & MAG HYDROXIDE-SIMETH 200-200-20 MG/5ML PO SUSP: 30.0000 mL | Freq: Four times a day (QID) | ORAL | Status: DC | PRN

## 2014-12-28 MED ORDER — KCL IN DEXTROSE-NACL 40-5-0.45 MEQ/L-%-% IV SOLN
INTRAVENOUS | Status: DC
  Filled 2014-12-28 (×6): qty 1000

## 2014-12-28 MED ORDER — STERILE WATER FOR INJECTION IJ SOLN
INTRAMUSCULAR | Status: AC
  Filled 2014-12-28: qty 10

## 2014-12-28 MED ORDER — FENTANYL CITRATE (PF) 100 MCG/2ML IJ SOLN
INTRAMUSCULAR | Status: AC
  Filled 2014-12-28: qty 2

## 2014-12-28 MED ORDER — METHYLPREDNISOLONE ACETATE 80 MG/ML IJ SUSP
INTRAMUSCULAR | Status: DC | PRN
  Administered 2014-12-28: 80 mg

## 2014-12-28 MED ORDER — ONDANSETRON HCL 4 MG/2ML IJ SOLN
INTRAMUSCULAR | Status: DC | PRN
  Administered 2014-12-28: 4 mg via INTRAVENOUS

## 2014-12-28 MED ORDER — ROCURONIUM BROMIDE 50 MG/5ML IV SOLN
INTRAVENOUS | Status: AC
  Filled 2014-12-28: qty 1

## 2014-12-28 MED ORDER — LIDOCAINE HCL (CARDIAC) 20 MG/ML IV SOLN
INTRAVENOUS | Status: DC | PRN
  Administered 2014-12-28: 60 mg via INTRATRACHEAL
  Administered 2014-12-28: 60 mg via INTRAVENOUS

## 2014-12-28 MED ORDER — BISACODYL 10 MG RE SUPP: 10.0000 mg | Freq: Every day | RECTAL | Status: DC | PRN

## 2014-12-28 MED ORDER — LIDOCAINE HCL (CARDIAC) 20 MG/ML IV SOLN
INTRAVENOUS | Status: AC
  Filled 2014-12-28: qty 10

## 2014-12-28 MED ORDER — MORPHINE SULFATE 4 MG/ML IJ SOLN: 4.0000 mg | INTRAMUSCULAR | Status: DC | PRN

## 2014-12-28 MED ORDER — ETOMIDATE 2 MG/ML IV SOLN
INTRAVENOUS | Status: AC
  Filled 2014-12-28: qty 10

## 2014-12-28 MED ORDER — LACTATED RINGERS IV SOLN
INTRAVENOUS | Status: DC | PRN
  Administered 2014-12-28 (×2): via INTRAVENOUS

## 2014-12-28 MED ORDER — ROCURONIUM BROMIDE 100 MG/10ML IV SOLN
INTRAVENOUS | Status: DC | PRN
  Administered 2014-12-28: 30 mg via INTRAVENOUS

## 2014-12-28 MED ORDER — ONDANSETRON HCL 4 MG/2ML IJ SOLN: 4.0000 mg | Freq: Once | INTRAMUSCULAR | Status: DC | PRN

## 2014-12-28 MED ORDER — BUPIVACAINE HCL (PF) 0.5 % IJ SOLN
INTRAMUSCULAR | Status: DC | PRN
  Administered 2014-12-28: 10 mL

## 2014-12-28 MED ORDER — MIDAZOLAM HCL 2 MG/2ML IJ SOLN
INTRAMUSCULAR | Status: AC
  Filled 2014-12-28: qty 2

## 2014-12-28 MED ORDER — KETOROLAC TROMETHAMINE 30 MG/ML IJ SOLN
INTRAMUSCULAR | Status: AC
  Filled 2014-12-28: qty 1

## 2014-12-28 MED ORDER — THROMBIN 5000 UNITS EX SOLR
OROMUCOSAL | Status: DC | PRN
  Administered 2014-12-28: 5 mL via TOPICAL

## 2014-12-28 MED ORDER — MAGNESIUM HYDROXIDE 400 MG/5ML PO SUSP: 30.0000 mL | Freq: Every day | ORAL | Status: DC | PRN

## 2014-12-28 MED ORDER — TRIAMTERENE-HCTZ 37.5-25 MG PO TABS
1.0000 | ORAL_TABLET | Freq: Every day | ORAL | Status: DC
  Filled 2014-12-28: qty 1

## 2014-12-28 MED ORDER — HYDROXYZINE HCL 50 MG/ML IM SOLN
50.0000 mg | INTRAMUSCULAR | Status: DC | PRN
  Administered 2014-12-28: 50 mg via INTRAMUSCULAR
  Filled 2014-12-28 (×2): qty 1

## 2014-12-28 MED ORDER — GLYCOPYRROLATE 0.2 MG/ML IJ SOLN
INTRAMUSCULAR | Status: DC | PRN
  Administered 2014-12-28: 0.3 mg via INTRAVENOUS

## 2014-12-28 MED ORDER — ONDANSETRON HCL 4 MG/2ML IJ SOLN
INTRAMUSCULAR | Status: AC
  Filled 2014-12-28: qty 2

## 2014-12-28 MED ORDER — PHENYLEPHRINE HCL 10 MG/ML IJ SOLN
INTRAMUSCULAR | Status: DC | PRN
  Administered 2014-12-28: 40 ug via INTRAVENOUS

## 2014-12-28 MED ORDER — ACETAMINOPHEN 650 MG RE SUPP: 650.0000 mg | RECTAL | Status: DC | PRN

## 2014-12-28 MED ORDER — SODIUM CHLORIDE 0.9 % IR SOLN
Status: DC | PRN
  Administered 2014-12-28: 500 mL

## 2014-12-28 MED ORDER — ONDANSETRON HCL 4 MG PO TABS: 4.0000 mg | ORAL_TABLET | Freq: Four times a day (QID) | ORAL | Status: DC | PRN

## 2014-12-28 MED ORDER — HYDROMORPHONE HCL 1 MG/ML IJ SOLN
INTRAMUSCULAR | Status: AC
Start: 2014-12-28 — End: 2014-12-28
  Filled 2014-12-28: qty 1

## 2014-12-28 MED ORDER — ONDANSETRON HCL 4 MG/2ML IJ SOLN
4.0000 mg | Freq: Four times a day (QID) | INTRAMUSCULAR | Status: DC | PRN
  Administered 2014-12-28: 4 mg via INTRAVENOUS
  Filled 2014-12-28: qty 2

## 2014-12-28 MED ORDER — NEOSTIGMINE METHYLSULFATE 10 MG/10ML IV SOLN
INTRAVENOUS | Status: AC
  Filled 2014-12-28: qty 1

## 2014-12-28 MED ORDER — PROPOFOL 10 MG/ML IV BOLUS
INTRAVENOUS | Status: DC | PRN
  Administered 2014-12-28: 150 mg via INTRAVENOUS

## 2014-12-28 MED ORDER — LEVOTHYROXINE SODIUM 50 MCG PO TABS
50.0000 ug | ORAL_TABLET | Freq: Every day | ORAL | Status: DC
  Administered 2014-12-29: 50 ug via ORAL
  Filled 2014-12-28 (×3): qty 1

## 2014-12-28 MED ORDER — KETOROLAC TROMETHAMINE 30 MG/ML IJ SOLN
15.0000 mg | Freq: Four times a day (QID) | INTRAMUSCULAR | Status: DC
  Administered 2014-12-28 – 2014-12-29 (×3): 15 mg via INTRAVENOUS
  Filled 2014-12-28 (×7): qty 1

## 2014-12-28 MED ORDER — FENTANYL CITRATE (PF) 100 MCG/2ML IJ SOLN
INTRAMUSCULAR | Status: DC | PRN
  Administered 2014-12-28: 50 ug via INTRAVENOUS
  Administered 2014-12-28: 100 ug via INTRAVENOUS
  Administered 2014-12-28: 50 ug via INTRAVENOUS

## 2014-12-28 MED ORDER — LIDOCAINE-EPINEPHRINE 1 %-1:100000 IJ SOLN
INTRAMUSCULAR | Status: DC | PRN
  Administered 2014-12-28: 10 mL

## 2014-12-28 MED ORDER — 0.9 % SODIUM CHLORIDE (POUR BTL) OPTIME
TOPICAL | Status: DC | PRN
  Administered 2014-12-28: 1000 mL

## 2014-12-28 MED ORDER — SUCCINYLCHOLINE CHLORIDE 20 MG/ML IJ SOLN
INTRAMUSCULAR | Status: AC
  Filled 2014-12-28: qty 1

## 2014-12-28 MED ORDER — DEXAMETHASONE SODIUM PHOSPHATE 4 MG/ML IJ SOLN
INTRAMUSCULAR | Status: DC | PRN
  Administered 2014-12-28: 4 mg via INTRAVENOUS

## 2014-12-28 MED ORDER — MIDAZOLAM HCL 5 MG/5ML IJ SOLN
INTRAMUSCULAR | Status: DC | PRN
  Administered 2014-12-28: 2 mg via INTRAVENOUS

## 2014-12-28 MED ORDER — EPHEDRINE SULFATE 50 MG/ML IJ SOLN
INTRAMUSCULAR | Status: AC
  Filled 2014-12-28: qty 1

## 2014-12-28 MED ORDER — OXYCODONE-ACETAMINOPHEN 5-325 MG PO TABS: 1.0000 | ORAL_TABLET | ORAL | Status: DC | PRN

## 2014-12-28 MED ORDER — SODIUM CHLORIDE 0.9 % IJ SOLN
3.0000 mL | Freq: Two times a day (BID) | INTRAMUSCULAR | Status: DC
  Administered 2014-12-28: 3 mL via INTRAVENOUS

## 2014-12-28 SURGICAL SUPPLY — 59 items
ADH SKN CLS APL DERMABOND .7 (GAUZE/BANDAGES/DRESSINGS) ×1
APL SKNCLS STERI-STRIP NONHPOA (GAUZE/BANDAGES/DRESSINGS)
BAG DECANTER FOR FLEXI CONT (MISCELLANEOUS) ×2 IMPLANT
BENZOIN TINCTURE PRP APPL 2/3 (GAUZE/BANDAGES/DRESSINGS) IMPLANT
BLADE CLIPPER SURG (BLADE) IMPLANT
BRUSH SCRUB EZ PLAIN DRY (MISCELLANEOUS) ×2 IMPLANT
BUR ACORN 6.0 ACORN (BURR) ×1 IMPLANT
BUR ACRON 5.0MM COATED (BURR) IMPLANT
BUR MATCHSTICK NEURO 3.0 LAGG (BURR) ×2 IMPLANT
CANISTER SUCT 3000ML PPV (MISCELLANEOUS) ×2 IMPLANT
CONT SPEC 4OZ CLIKSEAL STRL BL (MISCELLANEOUS) ×1 IMPLANT
DERMABOND ADVANCED (GAUZE/BANDAGES/DRESSINGS) ×1
DERMABOND ADVANCED .7 DNX12 (GAUZE/BANDAGES/DRESSINGS) IMPLANT
DRAPE LAPAROTOMY 100X72X124 (DRAPES) ×2 IMPLANT
DRAPE MICROSCOPE LEICA (MISCELLANEOUS) ×2 IMPLANT
DRAPE POUCH INSTRU U-SHP 10X18 (DRAPES) ×2 IMPLANT
DRSG EMULSION OIL 3X3 NADH (GAUZE/BANDAGES/DRESSINGS) IMPLANT
ELECT REM PT RETURN 9FT ADLT (ELECTROSURGICAL) ×2
ELECTRODE REM PT RTRN 9FT ADLT (ELECTROSURGICAL) ×1 IMPLANT
GAUZE SPONGE 4X4 12PLY STRL (GAUZE/BANDAGES/DRESSINGS) IMPLANT
GAUZE SPONGE 4X4 16PLY XRAY LF (GAUZE/BANDAGES/DRESSINGS) IMPLANT
GLOVE BIOGEL PI IND STRL 8 (GLOVE) ×1 IMPLANT
GLOVE BIOGEL PI INDICATOR 8 (GLOVE) ×1
GLOVE ECLIPSE 7.5 STRL STRAW (GLOVE) ×2 IMPLANT
GLOVE EXAM NITRILE LRG STRL (GLOVE) IMPLANT
GLOVE EXAM NITRILE MD LF STRL (GLOVE) IMPLANT
GLOVE EXAM NITRILE XL STR (GLOVE) IMPLANT
GLOVE EXAM NITRILE XS STR PU (GLOVE) IMPLANT
GOWN STRL REUS W/ TWL LRG LVL3 (GOWN DISPOSABLE) ×1 IMPLANT
GOWN STRL REUS W/ TWL XL LVL3 (GOWN DISPOSABLE) IMPLANT
GOWN STRL REUS W/TWL 2XL LVL3 (GOWN DISPOSABLE) IMPLANT
GOWN STRL REUS W/TWL LRG LVL3 (GOWN DISPOSABLE) ×2
GOWN STRL REUS W/TWL XL LVL3 (GOWN DISPOSABLE)
KIT BASIN OR (CUSTOM PROCEDURE TRAY) ×2 IMPLANT
KIT ROOM TURNOVER OR (KITS) ×2 IMPLANT
NDL HYPO 18GX1.5 BLUNT FILL (NEEDLE) IMPLANT
NDL SPNL 18GX3.5 QUINCKE PK (NEEDLE) ×1 IMPLANT
NDL SPNL 22GX3.5 QUINCKE BK (NEEDLE) ×1 IMPLANT
NEEDLE HYPO 18GX1.5 BLUNT FILL (NEEDLE) IMPLANT
NEEDLE SPNL 18GX3.5 QUINCKE PK (NEEDLE) ×2 IMPLANT
NEEDLE SPNL 22GX3.5 QUINCKE BK (NEEDLE) ×2 IMPLANT
NS IRRIG 1000ML POUR BTL (IV SOLUTION) ×2 IMPLANT
PACK LAMINECTOMY NEURO (CUSTOM PROCEDURE TRAY) ×2 IMPLANT
PAD ARMBOARD 7.5X6 YLW CONV (MISCELLANEOUS) ×6 IMPLANT
PATTIES SURGICAL .5 X1 (DISPOSABLE) ×2 IMPLANT
RUBBERBAND STERILE (MISCELLANEOUS) ×4 IMPLANT
SPONGE LAP 4X18 X RAY DECT (DISPOSABLE) IMPLANT
SPONGE SURGIFOAM ABS GEL 100 (HEMOSTASIS) ×2 IMPLANT
STRIP CLOSURE SKIN 1/2X4 (GAUZE/BANDAGES/DRESSINGS) IMPLANT
SUT PROLENE 6 0 BV (SUTURE) IMPLANT
SUT VIC AB 1 CT1 18XBRD ANBCTR (SUTURE) ×1 IMPLANT
SUT VIC AB 1 CT1 8-18 (SUTURE) ×2
SUT VIC AB 2-0 CP2 18 (SUTURE) ×3 IMPLANT
SUT VIC AB 3-0 SH 8-18 (SUTURE) IMPLANT
SYR 20CC LL (SYRINGE) ×2 IMPLANT
SYR 5ML LL (SYRINGE) IMPLANT
TOWEL OR 17X24 6PK STRL BLUE (TOWEL DISPOSABLE) ×2 IMPLANT
TOWEL OR 17X26 10 PK STRL BLUE (TOWEL DISPOSABLE) ×2 IMPLANT
WATER STERILE IRR 1000ML POUR (IV SOLUTION) ×2 IMPLANT

## 2014-12-28 NOTE — Op Note (Signed)
12/28/2014  10:21 AM  PATIENT:  Holly Richard  74 y.o. female  PRE-OPERATIVE DIAGNOSIS:  Left L3-4 and left L4-5 lumbar herniated disc, left L4-5 lumbar synovial cyst, left L3-4 and left L4-5 lateral recess and neural foraminal stenosis, lumbar spondylosis, lumbar degenerative disease, lumbar degenerative spondylolisthesis, left lumbar radiculopathy  POST-OPERATIVE DIAGNOSIS:  Left L3-4 and left L4-5 lumbar herniated disc, left L4-5 lumbar synovial cyst, left L3-4 and left L4-5 lateral recess and neural foraminal stenosis, lumbar spondylosis, lumbar degenerative disease, lumbar degenerative spondylolisthesis, left lumbar radiculopathy  PROCEDURE:  Procedure(s):  Left L3-4 and left L4-5 lumbar laminotomy, foraminotomy, and microdiscectomy, and left L4-5 resection of synovial cyst, with microdissection, microsurgical technique, and the operating microscope  SURGEON:  Surgeon(s): Jovita Gamma, MD Kary Kos, MD  ASSISTANTS:  Kary Kos, MD  ANESTHESIA:   general  EBL:  Total I/O In: 900 [I.V.:900] Out: -   BLOOD ADMINISTERED:none  COUNT: Correct per nursing staff  DICTATION: Patient was brought the operating room, placed under general endotracheal anesthesia. Patient was turned to a prone position. Lumbar region was prepped with Betadine soap and solution draped in a sterile fashion. A localizing x-ray was taken and the L3-4 and L4-5 levels were identified. The midline was infiltrated with local anesthetic with epinephrine, and a midline incision made over the L3-4 and L4-5 levels. Dissection was carried down to the subcutaneous tissue to the lumbodorsal fascia, which was incised the left side of the midline and the paraspinal musculature was dissected from the spinous process and lamina in a subperiosteal fashion. Another x-ray was taken and the L3-4 and L4-5 interlaminar spaces were identified. The operating microscope was draped and brought in the field to provide additional  magnification, illumination, and visualization, and the remainder the decompression was performed using micro-section microsurgical technique. Laminotomy was performed at both the L3-4 and L4-5 levels using the high-speed drill and Kerrison punches. The ligamentum flavum was carefully removed each level. At the L4-5 level there is a moderate sized partially calcified synovial cyst, there was carefully separated from the dura and removed in a piecemeal fashion. We then explored the epidural space at each level. Epidural veins were coagulated and divided. Foraminotomies performed for the exiting L3 and L4 at the L3-4 level and the exiting L4 and L5 nerve roots at the L4-5 level.  The epidural space was explored, and at each level we found a subligamentous disc herniation. We then proceeded with discectomy at both the L3-4 and L4-5 levels. At each level the annulus was incised, and the disc space entered. Using a variety of micro-curettes and pituitary rongeurs a thorough discectomy was performed at each level, with good decompression of the thecal sac and nerve root at each level. Once the discectomy was completed, and all loose fragments of disc material removed from the disc space and epidural space, the epidural space was explored, it was felt that good decompression of the thecal sac and exiting nerve roots was achieved at each level. Hemostasis established with the use of Gelfoam with thrombin this was removed we then used Surgifoam. Hemostasis was established and confirmed. We then instilled 2 mL of fentanyl and 80 mg of Depo-Medrol into the epidural space and proceeded with closure. Deep fascia was closed with interrupted undyed 1 Vicryl sutures. Scarpa's fascia closed with interrupted undyed 1 and 2-0 undyed Vicryl sutures. The subcutaneous and subcuticular closed with interrupted inverted 2-0 undyed Vicryl sutures. Skin edges were approximated with Dermabond. Estimated blood loss was 50 mL. Following  surgery  the patient was turned back to a supine position, to be reversed and the anesthetic, extubated, and transferred to the recovery room for further care.  PLAN OF CARE: Admit to inpatient   PATIENT DISPOSITION:  PACU - hemodynamically stable.   Delay start of Pharmacological VTE agent (>24hrs) due to surgical blood loss or risk of bleeding:  yes

## 2014-12-28 NOTE — Anesthesia Preprocedure Evaluation (Signed)
Anesthesia Evaluation  Patient identified by MRN, date of birth, ID band Patient awake    Reviewed: Allergy & Precautions, NPO status , Patient's Chart, lab work & pertinent test results  Airway Mallampati: I       Dental   Pulmonary    Pulmonary exam normal       Cardiovascular Normal cardiovascular exam    Neuro/Psych  Headaches,  Neuromuscular disease    GI/Hepatic (+) Hepatitis -, C  Endo/Other  Hypothyroidism   Renal/GU      Musculoskeletal   Abdominal   Peds  Hematology   Anesthesia Other Findings   Reproductive/Obstetrics                             Anesthesia Physical Anesthesia Plan  ASA: II  Anesthesia Plan: General   Post-op Pain Management:    Induction: Intravenous  Airway Management Planned: Oral ETT  Additional Equipment:   Intra-op Plan:   Post-operative Plan: Extubation in OR  Informed Consent: I have reviewed the patients History and Physical, chart, labs and discussed the procedure including the risks, benefits and alternatives for the proposed anesthesia with the patient or authorized representative who has indicated his/her understanding and acceptance.     Plan Discussed with: CRNA, Anesthesiologist and Surgeon  Anesthesia Plan Comments:         Anesthesia Quick Evaluation

## 2014-12-28 NOTE — Anesthesia Postprocedure Evaluation (Signed)
  Anesthesia Post-op Note  Patient: [name]  Procedure(s) Performed: Procedure(s) with comments: LUMBAR LAMINECTOMY/DECOMPRESSION MICRODISCECTOMY 2 LEVELS (Left) - Left L34 laminotomy, foraminotomy and poss microdiskectomy with left L45 laminotomy, foraminotomy, resection of synovial cyst and poss microdiskectomy  Patient Location: PACU  Anesthesia Type:General  Level of Consciousness: awake, alert , oriented and patient cooperative  Airway and Oxygen Therapy: Patient Spontanous Breathing  Post-op Pain: mild  Post-op Assessment: Post-op Vital signs reviewed, Patient's Cardiovascular Status Stable, Respiratory Function Stable, Patent Airway, No signs of Nausea or vomiting and Pain level controlled              Post-op Vital Signs: stable  Last Vitals:  Filed Vitals:   12/28/14 1054  BP:   Pulse: 78  Temp:   Resp: 18    Complications: No apparent anesthesia complications

## 2014-12-28 NOTE — Plan of Care (Signed)
Problem: Consults Goal: Diagnosis - Spinal Surgery Microdiscectomy

## 2014-12-28 NOTE — Transfer of Care (Signed)
Immediate Anesthesia Transfer of Care Note  Patient: Holly Richard  Procedure(s) Performed: Procedure(s) with comments: LUMBAR LAMINECTOMY/DECOMPRESSION MICRODISCECTOMY 2 LEVELS (Left) - Left L34 laminotomy, foraminotomy and poss microdiskectomy with left L45 laminotomy, foraminotomy, resection of synovial cyst and poss microdiskectomy  Patient Location: PACU  Anesthesia Type:General  Level of Consciousness: awake, alert  and oriented  Airway & Oxygen Therapy: Patient Spontanous Breathing and Patient connected to nasal cannula oxygen  Post-op Assessment: Report given to RN, Post -op Vital signs reviewed and stable and Patient moving all extremities X 4  Post vital signs: Reviewed and stable  Last Vitals:  Filed Vitals:   12/28/14 1030  BP: 126/84  Pulse: 83  Temp:   Resp: 23    Complications: No apparent anesthesia complications

## 2014-12-28 NOTE — Plan of Care (Signed)
Problem: Consults Goal: Diagnosis - Spinal Surgery Outcome: Completed/Met Date Met:  12/28/14 Microdiscectomy

## 2014-12-28 NOTE — Anesthesia Procedure Notes (Signed)
Procedure Name: Intubation Date/Time: 12/28/2014 8:26 AM Performed by: [place] Pink Pre-anesthesia Checklist: Patient identified, Emergency Drugs available, Suction available, Patient being monitored and Timeout performed Patient Re-evaluated:Patient Re-evaluated prior to inductionOxygen Delivery Method: Circle system utilized Preoxygenation: Pre-oxygenation with 100% oxygen Intubation Type: IV induction Ventilation: Mask ventilation without difficulty Laryngoscope Size: Mac and 3 Grade View: Grade I Tube type: Oral Tube size: 7.0 mm Number of attempts: 1 Airway Equipment and Method: Stylet and LTA kit utilized Placement Confirmation: ETT inserted through vocal cords under direct vision,  positive ETCO2 and breath sounds checked- equal and bilateral Secured at: 19 cm Tube secured with: Tape Dental Injury: Teeth and Oropharynx as per pre-operative assessment

## 2014-12-28 NOTE — H&P (Signed)
Subjective: Patient is a 74 y.o. female who is admitted for treatment of left lumbar radiculopathy secondary to multilevel multifactorial lateral recess stenosis contributed to by hypertrophic facet arthropathy, ligamentum flavum hypertrophy, degenerative disc bulging and protrusion, and possible disc herniation, as well as probable left L4-5 synovial cyst. Patient has been treated with NSAIDs, steroid Dosepaks, and epidural steroid injections without relief. Patient is admitted for a left L3-4 and left L4-5 lumbar laminotomy and foraminotomy, and at both levels possible microdiscectomy, and at the L4-5 level resection of a synovial cyst.   Patient Active Problem List   Diagnosis Date Noted  . Hepatitis C antibody test positive 12/07/2014  . Left sided sciatica 11/04/2012  . Hypothyroidism 07/26/2010  . Hyperlipidemia 07/26/2010  . MIGRAINE HEADACHE 07/26/2010  . [place] DISEASE 07/26/2010  . ARTHRITIS 07/26/2010  . ALLERGIC RHINITIS 07/05/2010  . DIVERTICULOSIS, COLON 07/05/2010  . OSTEOPENIA 07/05/2010   Past Medical History  Diagnosis Date  . MIGRAINE HEADACHE   . MENIERE'S DISEASE   . DIVERTICULOSIS, COLON   . ARTHRITIS   . OSTEOPENIA   . ALLERGIC RHINITIS   . HYPERLIPIDEMIA   . HYPOTHYROIDISM   . Hepatitis     exposed sometime ?? when    Past Surgical History  Procedure Laterality Date  . Tonsillectomy  1954  . Bunionectomy      X's - (R) 2/04 & (L) 3/04  . (r) ear implants  10/2008    Hearing loss    Prescriptions prior to admission  Medication Sig Dispense Refill Last Dose  . acetaminophen (TYLENOL) 500 MG tablet Take 500 mg by mouth every 6 (six) hours as needed for mild pain or moderate pain.   12/27/2014 at 2200  . aspirin 81 MG tablet Take 81 mg by mouth daily.     Past Week at Unknown time  . CALCIUM-VITAMIN D PO Take 1 tablet by mouth daily.    Past Week at Unknown time  . fluticasone (FLONASE) 50 MCG/ACT nasal spray Place 1 spray into both nostrils daily as  needed for allergies or rhinitis.   12/28/2014 at Unknown time  . ibuprofen (ADVIL,MOTRIN) 200 MG tablet Take 200 mg by mouth every 6 (six) hours as needed for headache, mild pain or moderate pain.   Past Week at Unknown time  . levothyroxine (SYNTHROID, LEVOTHROID) 50 MCG tablet Take 1 tablet (50 mcg total) by mouth daily before breakfast. 90 tablet 4 12/27/2014 at Unknown time  . Multiple Vitamins-Minerals (QC WOMENS DAILY MULTIVITAMIN) TABS Take 1 tablet by mouth daily.    Past Week at Unknown time  . NON FORMULARY Take 1 tablet by mouth daily at 12 noon. Instaflex Joint support   Past Week at Unknown time  . Omega-3 Fatty Acids (FISH OIL TRIPLE STRENGTH) 1400 MG CAPS Take 1 tablet by mouth daily.    Past Week at Unknown time  . Propylene Glycol (SYSTANE BALANCE) 0.6 % SOLN Apply 1 drop to eye daily as needed (dry eyes).   12/28/2014 at Unknown time  . RESTASIS 0.05 % ophthalmic emulsion Place 1 drop into both eyes 2 (two) times daily.    12/28/2014 at Unknown time  . triamterene-hydrochlorothiazide (MAXZIDE-25) 37.5-25 MG per tablet Take 1 tablet by mouth daily.     12/27/2014 at Unknown time   No Known Allergies  History  Substance Use Topics  . Smoking status: Never Smoker   . Smokeless tobacco: Never Used     Comment: Married, lives with spouse-2 sons and 4 g-kids  nearby. 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 Alcohol Use: No    Family History  Problem Relation Age of Onset  . Arthritis Mother   . Diabetes Mother   . Heart disease Mother   . Colon cancer Father 70  . Stroke Father   . Breast cancer Sister   . Diabetes Sister   . Lung cancer Sister   . Diabetes Brother   . Prostate cancer Brother   . Brain cancer Other     Nephew     Review of Systems A comprehensive review of systems was negative.  Objective: Vital signs in last 24 hours: Temp:  [97.1 F (36.2 C)] 97.1 F (36.2 C) (06/22 0644) Pulse Rate:  [80] 80 (06/22 0644) Resp:  [18] 18 (06/22 0644) BP:  (123)/(73) 123/73 mmHg (06/22 0644) SpO2:  [99 %] 99 % (06/22 0644) Weight:  [54.432 kg (120 lb)] 54.432 kg (120 lb) (06/22 0644)  EXAM: Patient is a well-developed well-nourished white female in no acute distress. Lungs are clear to auscultation , the patient has symmetrical respiratory excursion. Heart has a regular rate and rhythm normal S1 and S2 no murmur.   Abdomen is soft nontender nondistended bowel sounds are present. Extremity examination shows no clubbing cyanosis or edema. Motor examination shows 5 over 5 strength in the lower extremities including the iliopsoas quadriceps dorsiflexor extensor hallicus  longus and plantar flexor bilaterally. Sensation is intact to pinprick in the distal lower extremities. Reflexes are symmetrical bilaterally. No pathologic reflexes are present. Patient has a normal gait and stance.   Data Review:CBC    Component Value Date/Time   WBC 6.4 12/23/2014 1330   RBC 4.01 12/23/2014 1330   HGB 12.5 12/23/2014 1330   HCT 37.1 12/23/2014 1330   PLT 341 12/23/2014 1330   MCV 92.5 12/23/2014 1330   MCH 31.2 12/23/2014 1330   MCHC 33.7 12/23/2014 1330   RDW 13.2 12/23/2014 1330   LYMPHSABS 1.2 12/07/2014 0939   MONOABS 0.4 12/07/2014 0939   EOSABS 0.0 12/07/2014 0939   BASOSABS 0.0 12/07/2014 0939                          BMET    Component Value Date/Time   NA 141 12/23/2014 1330   K 3.2* 12/23/2014 1330   CL 102 12/23/2014 1330   CO2 28 12/23/2014 1330   GLUCOSE 105* 12/23/2014 1330   BUN 20 12/23/2014 1330   CREATININE 1.09* 12/23/2014 1330   CALCIUM 9.6 12/23/2014 1330   GFRNONAA 50* 12/23/2014 1330   GFRAA 58* 12/23/2014 1330     Assessment/Plan: Patient with left lumbar radiculopathy, admitted for a left L3-4 and left L4-5 lumbar laminotomy and foraminotomy, possible microdiscectomy at each level, and resection of synovial cyst at the L4-5 level.  I've discussed with the patient the nature of his condition, the nature the surgical  procedure, the typical length of surgery, hospital stay, and overall recuperation. We discussed limitations postoperatively. I discussed risks of surgery including risks of infection, bleeding, possibly need for transfusion, the risk of nerve root dysfunction with pain, weakness, numbness, or paresthesias, or risk of dural tear and CSF leakage and possible need for further surgery, the risk of recurrent disc herniation and the possible need for further surgery, and the risk of anesthetic complications including myocardial infarction, stroke, pneumonia, and death. Understanding all this the patient does wish to proceed with surgery and is admitted for such.  Hosie Spangle, MD 12/28/2014 8:10 AM

## 2014-12-28 NOTE — Progress Notes (Signed)
Filed Vitals:   12/28/14 1121 12/28/14 1126 12/28/14 1130 12/28/14 1212  BP:   127/69 124/67  Pulse: 70 73 71 81  Temp:   97.1 F (36.2 C) 98.1 F (36.7 C)  TempSrc:      Resp: 11 15 11 18   Height:      Weight:      SpO2: 100% 100% 100% 96%    Patient resting in bed, having difficulty with vertigo. Has been out of bed to the bathroom, and has voided. Wound clean and dry. Good relief of left lumbar radicular pain.  Plan: Will progress ambulation as vertigo settles down (has a history of Mnire's disease followed by Dr. Benjamine Mola).  Patient's husband given a prescription for Norco 5/325 one to 2 every 4 hours when necessary pain 60 tablets no refills. To be followed by Dr. Saintclair Halsted until discharge (I will be out of town until Monday, June 27).  I've given the patient, her husband, and her son discharge instructions regarding wound care and activities. She is scheduled to follow-up with me in mid July.  Hosie Spangle, MD 12/28/2014, 1:36 PM

## 2014-12-29 ENCOUNTER — Encounter (HOSPITAL_COMMUNITY): Payer: Self-pay | Admitting: Neurosurgery

## 2014-12-29 ENCOUNTER — Telehealth: Payer: Self-pay | Admitting: *Deleted

## 2014-12-29 NOTE — Discharge Instructions (Signed)
No lifting no bending no twisting no driving.  Wound Care Leave incision open to air. You may shower. Do not scrub directly on incision.  Do not put any creams, lotions, or ointments on incision. Activity Walk each and every day, increasing distance each day. No lifting greater than 5 lbs.  Avoid bending, arching, and twisting. No driving for 2 weeks; may ride as a passenger locally. If provided with back brace, wear when out of bed.  It is not necessary to wear in bed. Diet Resume your normal diet.  Return to Work Will be discussed at you follow up appointment. Call Your Doctor If Any of These Occur Redness, drainage, or swelling at the wound.  Temperature greater than 101 degrees. Severe pain not relieved by pain medication. Incision starts to come apart. Follow Up Appt Call today for appointment in 3 weeks ([phone]) or for problems.  If you have any hardware placed in your spine, you will need an x-ray before your appointment.

## 2014-12-29 NOTE — Progress Notes (Signed)
Pt doing well. Pt and husband given D/C instructions with Rx, verbal understanding was provided. Pt's incision is open to air and has no sign of infection. Pt's IV was removed prior to D/C. Pt D/C'd home via wheelchair @ [phone] per MD order. Pt is stable @ D/C and has no other needs at this time. Holli Humbles, RN

## 2014-12-29 NOTE — Discharge Summary (Signed)
  Physician Discharge Summary  Patient ID: Holly Richard MRN: [id number] DOB/AGE: September 29, 1940 74 y.o.  Admit date: 12/28/2014 Discharge date: 12/29/2014  Admission Diagnoses: Herniated nucleus pulposus lumbar spinal stenosis  Discharge Diagnoses: Samegood Active Problems:   Lumbar stenosis   Discharged Condition: good  Hospital Course: Patient admitted hospital underwent decompressive laminectomies and discectomy did very well with recovered in the floor on the floor was angling and voiding spontaneously tolerating regular diet was stable for discharge home.  Consults: Significant Diagnostic Studies: Treatments: Lumbar laminotomies Discharge Exam: Blood pressure 95/48, pulse 81, temperature 98.2 F (36.8 C), temperature source Oral, resp. rate 18, height 5\' 2"  (1.575 m), weight 54.432 kg (120 lb), SpO2 97 %. Strength out of 5 wound clean dry and intact  Disposition: Home     Medication List    TAKE these medications        acetaminophen 500 MG tablet  Commonly known as:  TYLENOL  Take 500 mg by mouth every 6 (six) hours as needed for mild pain or moderate pain.     aspirin 81 MG tablet  Take 81 mg by mouth daily.     CALCIUM-VITAMIN D PO  Take 1 tablet by mouth daily.     FISH OIL TRIPLE STRENGTH 1400 MG Caps  Take 1 tablet by mouth daily.     fluticasone 50 MCG/ACT nasal spray  Commonly known as:  FLONASE  Place 1 spray into both nostrils daily as needed for allergies or rhinitis.     HYDROcodone-acetaminophen 5-325 MG per tablet  Commonly known as:  NORCO/VICODIN  Take 1-2 tablets by mouth every 4 (four) hours as needed (mild pain).     ibuprofen 200 MG tablet  Commonly known as:  ADVIL,MOTRIN  Take 200 mg by mouth every 6 (six) hours as needed for headache, mild pain or moderate pain.     levothyroxine 50 MCG tablet  Commonly known as:  SYNTHROID, LEVOTHROID  Take 1 tablet (50 mcg total) by mouth daily before breakfast.     NON FORMULARY  Take 1  tablet by mouth daily at 12 noon. Instaflex Joint support     QC WOMENS DAILY MULTIVITAMIN Tabs  Take 1 tablet by mouth daily.     RESTASIS 0.05 % ophthalmic emulsion  Generic drug:  cycloSPORINE  Place 1 drop into both eyes 2 (two) times daily.     SYSTANE BALANCE 0.6 % Soln  Generic drug:  Propylene Glycol  Apply 1 drop to eye daily as needed (dry eyes).     triamterene-hydrochlorothiazide 37.5-25 MG per tablet  Commonly known as:  MAXZIDE-25  Take 1 tablet by mouth daily.           Follow-up Information    Follow up with Hosie Spangle, MD.   Specialty:  Neurosurgery   Contact information:   [address] [place] [address] [address] [phone]       Signed: Elaina Hoops 12/29/2014, 7:07 AM

## 2014-12-29 NOTE — Progress Notes (Signed)
Patient ID: Holly Richard, female   DOB: 1941-05-06, 74 y.o.   MRN: [id number] Doing well no leg pain  Wound clean dry and intact  Discharge home

## 2014-12-29 NOTE — Telephone Encounter (Signed)
Pt was on tcm list d/c 6/23 had decompression laminectomy and discectomy will f/u with Neurosurgeon Dr. Sherwood Gambler...Holly Richard

## 2015-01-02 ENCOUNTER — Other Ambulatory Visit: Payer: Self-pay

## 2015-01-06 ENCOUNTER — Telehealth: Payer: Self-pay

## 2015-01-06 NOTE — Telephone Encounter (Signed)
Received fax for dx code for hep c ab test. Faxed back with dx R89.4 for previous + hep c ab.

## 2015-01-21 DIAGNOSIS — H903 Sensorineural hearing loss, bilateral: Secondary | ICD-10-CM | POA: Diagnosis not present

## 2015-01-21 DIAGNOSIS — H9103 Ototoxic hearing loss, bilateral: Secondary | ICD-10-CM | POA: Diagnosis not present

## 2015-02-27 ENCOUNTER — Encounter: Payer: Self-pay | Admitting: Internal Medicine

## 2015-02-27 ENCOUNTER — Ambulatory Visit (INDEPENDENT_AMBULATORY_CARE_PROVIDER_SITE_OTHER): Payer: Medicare Other | Admitting: Internal Medicine

## 2015-02-27 VITALS — BP 128/84 | HR 91 | Temp 97.5°F | Wt 120.8 lb

## 2015-02-27 DIAGNOSIS — Z0000 Encounter for general adult medical examination without abnormal findings: Secondary | ICD-10-CM

## 2015-02-27 DIAGNOSIS — E785 Hyperlipidemia, unspecified: Secondary | ICD-10-CM

## 2015-02-27 DIAGNOSIS — E039 Hypothyroidism, unspecified: Secondary | ICD-10-CM

## 2015-02-27 DIAGNOSIS — H8101 Meniere's disease, right ear: Secondary | ICD-10-CM | POA: Diagnosis not present

## 2015-02-27 DIAGNOSIS — Z23 Encounter for immunization: Secondary | ICD-10-CM

## 2015-02-27 DIAGNOSIS — H903 Sensorineural hearing loss, bilateral: Secondary | ICD-10-CM | POA: Diagnosis not present

## 2015-02-27 NOTE — Assessment & Plan Note (Signed)
Hx same but never on rx med - good ratio with high HDL - check lipids anually

## 2015-02-27 NOTE — Patient Instructions (Addendum)
It was good to see you today.  We have reviewed your prior records including labs and tests today  Health Maintenance reviewed - Prevnar 13 pneumonia vaccination updated today - all other recommended immunizations and age-appropriate screenings are up-to-date.  Medications reviewed and updated, no changes recommended at this time.  Please schedule followup in 12 months for annual exam and labs, call sooner if problems.  Health Maintenance Adopting a healthy lifestyle and getting preventive care can go a long way to promote health and wellness. Talk with your health care provider about what schedule of regular examinations is right for you. This is a good chance for you to check in with your provider about disease prevention and staying healthy. In between checkups, there are plenty of things you can do on your own. Experts have done a lot of research about which lifestyle changes and preventive measures are most likely to keep you healthy. Ask your health care provider for more information. WEIGHT AND DIET  Eat a healthy diet  Be sure to include plenty of vegetables, fruits, low-fat dairy products, and lean protein.  Do not eat a lot of foods high in solid fats, added sugars, or salt.  Get regular exercise. This is one of the most important things you can do for your health.  Most adults should exercise for at least 150 minutes each week. The exercise should increase your heart rate and make you sweat (moderate-intensity exercise).  Most adults should also do strengthening exercises at least twice a week. This is in addition to the moderate-intensity exercise.  Maintain a healthy weight  Body mass index (BMI) is a measurement that can be used to identify possible weight problems. It estimates body fat based on height and weight. Your health care provider can help determine your BMI and help you achieve or maintain a healthy weight.  For females 70 years of age and older:   A BMI  below 18.5 is considered underweight.  A BMI of 18.5 to 24.9 is normal.  A BMI of 25 to 29.9 is considered overweight.  A BMI of 30 and above is considered obese.  Watch levels of cholesterol and blood lipids  You should start having your blood tested for lipids and cholesterol at 72 years of age, then have this test every 5 years.  You may need to have your cholesterol levels checked more often if:  Your lipid or cholesterol levels are high.  You are older than 72 years of age.  You are at high risk for heart disease.  CANCER SCREENING   Lung Cancer  Lung cancer screening is recommended for adults 20-59 years old who are at high risk for lung cancer because of a history of smoking.  A yearly low-dose CT scan of the lungs is recommended for people who:  Currently smoke.  Have quit within the past 15 years.  Have at least a 30-pack-year history of smoking. A pack year is smoking an average of one pack of cigarettes a day for 1 year.  Yearly screening should continue until it has been 15 years since you quit.  Yearly screening should stop if you develop a health problem that would prevent you from having lung cancer treatment.  Breast Cancer  Practice breast self-awareness. This means understanding how your breasts normally appear and feel.  It also means doing regular breast self-exams. Let your health care provider know about any changes, no matter how small.  If you are in your 44s or  47s, you should have a clinical breast exam (CBE) by a health care provider every 1-3 years as part of a regular health exam.  If you are 96 or older, have a CBE every year. Also consider having a breast X-ray (mammogram) every year.  If you have a family history of breast cancer, talk to your health care provider about genetic screening.  If you are at high risk for breast cancer, talk to your health care provider about having an MRI and a mammogram every year.  Breast cancer gene  (BRCA) assessment is recommended for women who have family members with BRCA-related cancers. BRCA-related cancers include:  Breast.  Ovarian.  Tubal.  Peritoneal cancers.  Results of the assessment will determine the need for genetic counseling and BRCA1 and BRCA2 testing. Cervical Cancer Routine pelvic examinations to screen for cervical cancer are no longer recommended for nonpregnant women who are considered low risk for cancer of the pelvic organs (ovaries, uterus, and vagina) and who do not have symptoms. A pelvic examination may be necessary if you have symptoms including those associated with pelvic infections. Ask your health care provider if a screening pelvic exam is right for you.   The Pap test is the screening test for cervical cancer for women who are considered at risk.  If you had a hysterectomy for a problem that was not cancer or a condition that could lead to cancer, then you no longer need Pap tests.  If you are older than 65 years, and you have had normal Pap tests for the past 10 years, you no longer need to have Pap tests.  If you have had past treatment for cervical cancer or a condition that could lead to cancer, you need Pap tests and screening for cancer for at least 20 years after your treatment.  If you no longer get a Pap test, assess your risk factors if they change (such as having a new sexual partner). This can affect whether you should start being screened again.  Some women have medical problems that increase their chance of getting cervical cancer. If this is the case for you, your health care provider may recommend more frequent screening and Pap tests.  The human papillomavirus (HPV) test is another test that may be used for cervical cancer screening. The HPV test looks for the virus that can cause cell changes in the cervix. The cells collected during the Pap test can be tested for HPV.  The HPV test can be used to screen women 25 years of age and  older. Getting tested for HPV can extend the interval between normal Pap tests from three to five years.  An HPV test also should be used to screen women of any age who have unclear Pap test results.  After 72 years of age, women should have HPV testing as often as Pap tests.  Colorectal Cancer  This type of cancer can be detected and often prevented.  Routine colorectal cancer screening usually begins at 72 years of age and continues through 72 years of age.  Your health care provider may recommend screening at an earlier age if you have risk factors for colon cancer.  Your health care provider may also recommend using home test kits to check for hidden blood in the stool.  A small camera at the end of a tube can be used to examine your colon directly (sigmoidoscopy or colonoscopy). This is done to check for the earliest forms of colorectal cancer.  Routine screening usually begins at age 38.  Direct examination of the colon should be repeated every 5-10 years through 72 years of age. However, you may need to be screened more often if early forms of precancerous polyps or small growths are found. Skin Cancer  Check your skin from head to toe regularly.  Tell your health care provider about any new moles or changes in moles, especially if there is a change in a mole's shape or color.  Also tell your health care provider if you have a mole that is larger than the size of a pencil eraser.  Always use sunscreen. Apply sunscreen liberally and repeatedly throughout the day.  Protect yourself by wearing long sleeves, pants, a wide-brimmed hat, and sunglasses whenever you are outside. HEART DISEASE, DIABETES, AND HIGH BLOOD PRESSURE   Have your blood pressure checked at least every 1-2 years. High blood pressure causes heart disease and increases the risk of stroke.  If you are between 57 years and 9 years old, ask your health care provider if you should take aspirin to prevent  strokes.  Have regular diabetes screenings. This involves taking a blood sample to check your fasting blood sugar level.  If you are at a normal weight and have a low risk for diabetes, have this test once every three years after 72 years of age.  If you are overweight and have a high risk for diabetes, consider being tested at a younger age or more often. PREVENTING INFECTION  Hepatitis B  If you have a higher risk for hepatitis B, you should be screened for this virus. You are considered at high risk for hepatitis B if:  You were born in a country where hepatitis B is common. Ask your health care provider which countries are considered high risk.  Your parents were born in a high-risk country, and you have not been immunized against hepatitis B (hepatitis B vaccine).  You have HIV or AIDS.  You use needles to inject street drugs.  You live with someone who has hepatitis B.  You have had sex with someone who has hepatitis B.  You get hemodialysis treatment.  You take certain medicines for conditions, including cancer, organ transplantation, and autoimmune conditions. Hepatitis C  Blood testing is recommended for:  Everyone born from [birth year] through 1963.  Anyone with known risk factors for hepatitis C. Sexually transmitted infections (STIs)  You should be screened for sexually transmitted infections (STIs) including gonorrhea and chlamydia if:  You are sexually active and are younger than 72 years of age.  You are older than 72 years of age and your health care provider tells you that you are at risk for this type of infection.  Your sexual activity has changed since you were last screened and you are at an increased risk for chlamydia or gonorrhea. Ask your health care provider if you are at risk.  If you do not have HIV, but are at risk, it may be recommended that you take a prescription medicine daily to prevent HIV infection. This is called pre-exposure prophylaxis  (PrEP). You are considered at risk if:  You are sexually active and do not regularly use condoms or know the HIV status of your partner(s).  You take drugs by injection.  You are sexually active with a partner who has HIV. Talk with your health care provider about whether you are at high risk of being infected with HIV. If you choose to begin PrEP, you should first  be tested for HIV. You should then be tested every 3 months for as long as you are taking PrEP.  PREGNANCY   If you are premenopausal and you may become pregnant, ask your health care provider about preconception counseling.  If you may become pregnant, take 400 to 800 micrograms (mcg) of folic acid every day.  If you want to prevent pregnancy, talk to your health care provider about birth control (contraception). OSTEOPOROSIS AND MENOPAUSE   Osteoporosis is a disease in which the bones lose minerals and strength with aging. This can result in serious bone fractures. Your risk for osteoporosis can be identified using a bone density scan.  If you are 98 years of age or older, or if you are at risk for osteoporosis and fractures, ask your health care provider if you should be screened.  Ask your health care provider whether you should take a calcium or vitamin D supplement to lower your risk for osteoporosis.  Menopause may have certain physical symptoms and risks.  Hormone replacement therapy may reduce some of these symptoms and risks. Talk to your health care provider about whether hormone replacement therapy is right for you.  HOME CARE INSTRUCTIONS   Schedule regular health, dental, and eye exams.  Stay current with your immunizations.   Do not use any tobacco products including cigarettes, chewing tobacco, or electronic cigarettes.  If you are pregnant, do not drink alcohol.  If you are breastfeeding, limit how much and how often you drink alcohol.  Limit alcohol intake to no more than 1 drink per day for  nonpregnant women. One drink equals 12 ounces of beer, 5 ounces of wine, or 1 ounces of hard liquor.  Do not use street drugs.  Do not share needles.  Ask your health care provider for help if you need support or information about quitting drugs.  Tell your health care provider if you often feel depressed.  Tell your health care provider if you have ever been abused or do not feel safe at home. Document Released: 01/07/2011 Document Revised: 11/08/2013 Document Reviewed: 05/26/2013 [facility] Patient Information 2015 [place], [place]. This information is not intended to replace advice given to you by your health care provider. Make sure you discuss any questions you have with your health care provider.

## 2015-02-27 NOTE — Assessment & Plan Note (Signed)
The current medical regimen is effective;  continue present plan and medications.  Lab Results  Component Value Date   TSH 1.69 12/07/2014

## 2015-02-27 NOTE — Progress Notes (Signed)
Pre visit review using our clinic review tool, if applicable. No additional management support is needed unless otherwise documented below in the visit note. 

## 2015-02-27 NOTE — Progress Notes (Signed)
Subjective:    Patient ID: Holly Richard, female    DOB: 10-21-1940, 74 y.o.   MRN: [id number]  HPI   Here for medicare wellness  Diet: heart healthy  Physical activity: active, walks 30-60" 5x/wk Depression/mood screen: negative Hearing: follows with ENT - Meniere's and hearing aides Visual acuity: grossly normal, performs annual eye exam  ADLs: capable Fall risk: none Home safety: good Cognitive evaluation: intact to orientation, naming, recall and repetition EOL planning: adv directives, full code/ I agree  I have personally reviewed and have noted 1. The patient's medical and social history 2. Their use of alcohol, tobacco or illicit drugs 3. Their current medications and supplements 4. The patient's functional ability including ADL's, fall risks, home safety risks and hearing or visual impairment. 5. Diet and physical activities 6. Evidence for depression or mood disorders  Also reviewed chronic medical issues, interval events and current concerns   Past Medical History  Diagnosis Date  . MIGRAINE HEADACHE   . MENIERE'S DISEASE   . DIVERTICULOSIS, COLON   . ARTHRITIS   . OSTEOPENIA   . ALLERGIC RHINITIS   . HYPERLIPIDEMIA   . HYPOTHYROIDISM   . Hepatitis C antibody test positive 11/2014    negative RNA confirmation test   Family History  Problem Relation Age of Onset  . Arthritis Mother   . Diabetes Mother   . Heart disease Mother   . Colon cancer Father 63  . Stroke Father   . Breast cancer Sister   . Diabetes Sister   . Lung cancer Sister   . Diabetes Brother   . Prostate cancer Brother   . Brain cancer Other     Nephew   Social History  Substance Use Topics  . Smoking status: Never Smoker   . Smokeless tobacco: Never Used  . Alcohol Use: No    Review of Systems  Constitutional: Negative for fatigue and unexpected weight change.  Respiratory: Negative for cough, shortness of breath and wheezing.   Cardiovascular: Negative for chest pain,  palpitations and leg swelling.  Gastrointestinal: Negative for nausea, abdominal pain and diarrhea.  Neurological: Positive for tremors (mild in dominate hand in AM, while applying makeup - worse somedays and better other days). Negative for dizziness, weakness, light-headedness and headaches.  Psychiatric/Behavioral: Negative for dysphoric mood. The patient is not nervous/anxious.   All other systems reviewed and are negative.   Patient Care Team: Rowe Clack, MD as PCP - General (Internal Medicine) Leta Baptist, MD (Otolaryngology) Jovita Gamma, MD (Neurosurgery) Suella Broad, MD (Physical Medicine and Rehabilitation)     Objective:    Physical Exam  Constitutional: She appears well-developed and well-nourished. No distress.  Cardiovascular: Normal rate, regular rhythm and normal heart sounds.   No murmur heard. Pulmonary/Chest: Effort normal and breath sounds normal. No respiratory distress.  Musculoskeletal: She exhibits no edema.    BP 128/84 mmHg  Pulse 91  Temp(Src) 97.5 F (36.4 C)  Wt 120 lb 12 oz (54.772 kg)  SpO2 97% Wt Readings from Last 3 Encounters:  02/27/15 120 lb 12 oz (54.772 kg)  12/28/14 120 lb (54.432 kg)  12/23/14 120 lb (54.432 kg)    Lab Results  Component Value Date   WBC 6.4 12/23/2014   HGB 12.5 12/23/2014   HCT 37.1 12/23/2014   PLT 341 12/23/2014   GLUCOSE 105* 12/23/2014   CHOL 222* 12/07/2014   TRIG 85.0 12/07/2014   HDL 75.60 12/07/2014   LDLDIRECT 126.8 11/04/2012   LDLCALC  129* 12/07/2014   ALT 17 12/23/2014   AST 19 12/23/2014   NA 141 12/23/2014   K 3.2* 12/23/2014   CL 102 12/23/2014   CREATININE 1.09* 12/23/2014   BUN 20 12/23/2014   CO2 28 12/23/2014   TSH 1.69 12/07/2014    No results found.     Assessment & Plan:   AWV/CPX - z00.00 - Today patient counseled on age appropriate routine health concerns for screening and prevention, each reviewed and up to date or declined. Immunizations reviewed and up to date  or declined. Labs reviewed. Risk factors for depression reviewed and negative. Hearing function and visual acuity are intact. ADLs screened and addressed as needed. Functional ability and level of safety reviewed and appropriate. Education, counseling and referrals performed based on assessed risks today. Patient provided with a copy of personalized plan for preventive services.   Problem List Items Addressed This Visit    Hyperlipidemia    Hx same but never on rx med - good ratio with high HDL - check lipids anually       Hypothyroidism    The current medical regimen is effective;  continue present plan and medications.  Lab Results  Component Value Date   TSH 1.69 12/07/2014          Other Visit Diagnoses    Routine general medical examination at a health care facility    -  Primary        Gwendolyn Grant, MD

## 2015-03-21 DIAGNOSIS — Z9889 Other specified postprocedural states: Secondary | ICD-10-CM | POA: Diagnosis not present

## 2015-03-21 DIAGNOSIS — M549 Dorsalgia, unspecified: Secondary | ICD-10-CM | POA: Diagnosis not present

## 2015-03-21 DIAGNOSIS — M5136 Other intervertebral disc degeneration, lumbar region: Secondary | ICD-10-CM | POA: Diagnosis not present

## 2015-03-21 DIAGNOSIS — M4726 Other spondylosis with radiculopathy, lumbar region: Secondary | ICD-10-CM | POA: Diagnosis not present

## 2015-03-23 DIAGNOSIS — H2513 Age-related nuclear cataract, bilateral: Secondary | ICD-10-CM | POA: Diagnosis not present

## 2015-03-23 DIAGNOSIS — H04123 Dry eye syndrome of bilateral lacrimal glands: Secondary | ICD-10-CM | POA: Diagnosis not present

## 2015-03-23 DIAGNOSIS — H16223 Keratoconjunctivitis sicca, not specified as Sjogren's, bilateral: Secondary | ICD-10-CM | POA: Diagnosis not present

## 2015-03-29 ENCOUNTER — Ambulatory Visit: Payer: Medicare Other | Attending: Neurosurgery

## 2015-03-29 DIAGNOSIS — M545 Low back pain, unspecified: Secondary | ICD-10-CM

## 2015-03-29 NOTE — Therapy (Signed)
[facility] [address] [place], [place], [address] Phone: [phone]   Fax:  [phone]  Physical Therapy Evaluation  Patient Details  Name: Holly Richard MRN: [id number] Date of Birth: Feb 24, 1941 Referring Provider:  Jovita Gamma, MD  Encounter Date: 03/29/2015      PT End of Session - 03/29/15 1219    Visit Number 1   Number of Visits 10   Date for PT Re-Evaluation 05/03/15   PT Start Time 8676   PT Stop Time 1222   PT Time Calculation (min) 38 min   Activity Tolerance Patient tolerated treatment well   Behavior During Therapy [facility] for tasks assessed/performed      Past Medical History  Diagnosis Date  . MIGRAINE HEADACHE   . MENIERE'S DISEASE   . DIVERTICULOSIS, COLON   . ARTHRITIS   . OSTEOPENIA   . ALLERGIC RHINITIS   . HYPERLIPIDEMIA   . HYPOTHYROIDISM   . Hepatitis C antibody test positive 11/2014    negative RNA confirmation test    Past Surgical History  Procedure Laterality Date  . Tonsillectomy  1954  . Bunionectomy      X's - (R) 2/04 & (L) 3/04  . (r) ear implants  10/2008    Hearing loss  . Lumbar laminectomy/decompression microdiscectomy Left 12/28/2014    Procedure: LUMBAR LAMINECTOMY/DECOMPRESSION MICRODISCECTOMY 2 LEVELS;  Surgeon: Jovita Gamma, MD;  Location: [place] [facility];  Service: Neurosurgery;  Laterality: Left;  Left L34 laminotomy, foraminotomy and poss microdiskectomy with left L45 laminotomy, foraminotomy, resection of synovial cyst and poss microdiskectomy    There were no vitals filed for this visit.  Visit Diagnosis:  Bilateral low back pain without sciatica - Plan: PT plan of care cert/re-cert      Subjective Assessment - 03/29/15 1148    Subjective Pt presents to PT s/p lumbar surgery 12/28/14 for laminectomy, decompression and 2 level microdisectomy.  Pt reports that she had Lt LE symptoms prior to surgery.     Limitations Standing   How long can you  stand comfortably? 1-2 hours without housework   Patient Stated Goals improve mobiltiy/flexibility, reduce soreness, return to yardwork   Currently in Pain? Yes   Pain Score 4    Pain Location Back   Pain Orientation Right;Left;Lower   Pain Descriptors / Indicators Sore   Pain Onset More than a month ago   Pain Frequency Intermittent   Aggravating Factors  bending, standing    Pain Relieving Factors ibuprofen, sitting down            [facility] PT Assessment - 03/29/15 0001    Assessment   Medical Diagnosis Osteoarthritis of the spine with lumbar radiculopathy (s/p surgery) M47.26   Onset Date/Surgical Date 12/28/14   Next MD Visit 06/10/15   Precautions   Precautions Back;Other (comment)   Precaution Comments Pt is hard of hearing and has completed hearing loss on the Rt side   Restrictions   Weight Bearing Restrictions No   Balance Screen   Has the patient fallen in the past 6 months No   Has the patient had a decrease in activity level because of a fear of falling?  No   Is the patient reluctant to leave their home because of a fear of falling?  No   Home Environment   Living Environment Private residence   Living Arrangements Spouse/significant other   Home Access Stairs to enter   [place] One level   Prior  Function   Level of Independence Independent   Vocation Retired   Leisure walking for exercise   Cognition   Overall Cognitive Status Within Functional Limits for tasks assessed   Observation/Other Assessments   Focus on Therapeutic Outcomes (FOTO)  41% limitation   Posture/Postural Control   Posture/Postural Control No significant limitations   ROM / Strength   AROM / PROM / Strength AROM;PROM;Strength   AROM   Overall AROM  Within functional limits for tasks performed   Overall AROM Comments Full lumbar AROM.  Lt lumbar stiffness/soreness reported at end range flexion and Rt sidebending.   PROM   Overall PROM  Deficits   Overall PROM Comments Lt hip PROM  limited by 25% vs the Rt into flexion and IR   Strength   Overall Strength Within functional limits for tasks performed   Overall Strength Comments 4+/5 bilateral LE strength   Palpation   Spinal mobility Limited spinal mobility L2-5    Palpation comment tension noted over Lt lumbar paraspinals and deep gluteals vs the Rt   Ambulation/Gait   Ambulation/Gait Yes   Ambulation Distance (Feet) 100 Feet   Gait Pattern Within Functional Limits                           PT Education - 03/29/15 1214    Education provided Yes   Education Details HEP: hamstring stretch, hip flexor, single  knee to chest, pelvic tilt   Person(s) Educated Patient   Methods Explanation;Demonstration;Handout   Comprehension Verbalized understanding;Returned demonstration             PT Long Term Goals - 03/29/15 1141    PT LONG TERM GOAL #1   Title be independent in advanced HEP   Time 5   Period Weeks   Status New   PT LONG TERM GOAL #2   Title reduce FOTO to < or = to 39% limitation   Time 5   Period Weeks   Status New   PT LONG TERM GOAL #3   Title verbalize understanding of body mechanics modifications to protect spine with ADLs, housework and yardwork   Time 5   Period Weeks   Status New   PT LONG TERM GOAL #4   Title report < or  = to 1/10 LBP with yardwork and houswork activity   Time 5   Period Weeks   Status New               Plan - 03/29/15 1222    Clinical Impression Statement Pt presents to PT s/p lumbar laminetomy, decompression and 2 level microdisectomy 12/28/14.  Pt reports that she is not sure what she is allowed to do and has been walking for exercise only.  Pt demonstrates hip stiffness and has limited knowledge of correct body mechanics for safe return to yardwork.  Pt will benefit from skilled PT for core stabilization exercise, hip flexibility and body mechanics education.     Pt will benefit from skilled therapeutic intervention in order to  improve on the following deficits Pain;Decreased strength;Improper body mechanics;Decreased activity tolerance;Impaired flexibility   Rehab Potential Good   PT Frequency 2x / week   PT Duration 4 weeks   PT Treatment/Interventions ADLs/Self Care Home Management;Electrical Stimulation;Moist Heat;Therapeutic exercise;Therapeutic activities;Functional mobility training;Ultrasound;Neuromuscular re-education;Patient/family education;Manual techniques;Passive range of motion   PT Next Visit Plan Body mechanics education, core stregth progression. Hip flexibility.   Consulted and Agree with Plan of Care  Patient          G-Codes - 03/29/15 1141    Functional Assessment Tool Used FOTO: 41% limitation   Functional Limitation Other PT primary   Other PT Primary Current Status (E1583) At least 40 percent but less than 60 percent impaired, limited or restricted   Other PT Primary Goal Status (E9407) At least 20 percent but less than 40 percent impaired, limited or restricted       Problem List Patient Active Problem List   Diagnosis Date Noted  . Lumbar stenosis 12/28/2014  . Hepatitis C antibody test positive 12/07/2014  . Left sided sciatica 11/04/2012  . Hypothyroidism 07/26/2010  . Hyperlipidemia 07/26/2010  . MIGRAINE HEADACHE 07/26/2010  . [place] DISEASE 07/26/2010  . ARTHRITIS 07/26/2010  . ALLERGIC RHINITIS 07/05/2010  . DIVERTICULOSIS, COLON 07/05/2010  . OSTEOPENIA 07/05/2010    TAKACS,KELLY, PT 03/29/2015, 12:29 PM  [facility] [address], [place], [place], [address] Phone: [phone]   Fax:  [phone]

## 2015-03-29 NOTE — Patient Instructions (Signed)
Hip Flexor Stretch   Lying on back near edge of bed, bend one leg, foot flat. Hang other leg over edge, relaxed, thigh resting entirely on bed for 20 seconds. Repeat __3__ times. Do ___3_ sessions per day. Advanced Exercise: Bend knee back keeping thigh in contact with bed.  http://gt2.exer.us/347   Copyright  VHI. All rights reserved.  Knee to Chest   Lying supine, bend involved knee to chest, hold 20 seconds, do 3__ times. Repeat with other leg. Do _3__ times per day.  Copyright  VHI. All rights reserved.   (Home) Flexion: Pelvic Tilt   Lie with neck supported, knees bent, feet flat. Tighten and suck stomach in, pushing back down against surface. Do not push down with legs.  Hold 5 seconds Repeat _10___ times per set. Do _1-2___ sets per session. Do ____ sessions per week.  Copyright  VHI. All rights reserved.  HIP: Hamstrings - Short Sitting   Rest leg on raised surface. Keep knee straight. Lift chest. Hold __20_ seconds. __3_ reps per set, _3__ sets per day, ___ days per week  Copyright  VHI. All rights reserved.  [facility] [address], [place], [place] [address] Phone # [phone] Fax [phone]

## 2015-04-05 ENCOUNTER — Ambulatory Visit: Payer: Medicare Other | Admitting: Physical Therapy

## 2015-04-05 ENCOUNTER — Encounter: Payer: Self-pay | Admitting: Physical Therapy

## 2015-04-05 DIAGNOSIS — M545 Low back pain, unspecified: Secondary | ICD-10-CM

## 2015-04-05 NOTE — Patient Instructions (Addendum)
Lower abdominal/core stability exercises  1. Practice your breathing technique: Inhale through your nose expanding your belly and rib cage. Try not to breathe into your chest. Exhale slowly and gradually out your mouth feeling a sense of softness to your body. Practice multiple times. This can be performed unlimited.  2. Finding the lower abdominals. Laying on your back with the knees bent, place your fingers just below your belly button. Using your breathing technique from above, on your exhale gently pull the belly button away from your fingertips without tensing any other muscles. Practice this 5x. Next, as you exhale, draw belly button inwards and hold onto it...then feel as if you are pulling that muscle across your pelvis like you are tightening a belt. This can be hard to do at first so be patient and practice. Do 5-10 reps 1-3 x day. Always recognize quality over quantity; if your abdominal muscles become tired you will notice you may tighten/contract other muscles. This is the time to take a break.   Practice this first laying on your back, then in sitting, progressing to standing and finally adding it to all your daily movements.   3. Finding your pelvic floor. Using the breathing technique above, when your exhale, this time draw your pelvic floor muscles up as if you were attempting to stop the flow of urination. Be careful NOT to tense any other muscles. This can be hard, BE PATIENT. Try to hold up to 10 seconds repeating 10x. Try 2x a day. Once you feel you are doing this well, add this contraction to exercise #2. First contracting your pelvic floor followed by lower abdominals.   4. Adding leg movements. Add the following leg movements to challenge your ability to keep your core stable:  1. Single leg drop outs: Laying on your back with knees bent feet flat. Inhale,  dropping one knee outward KEEPING YOUR PELVIS STILL. Exhale as you bring the leg back, simultaneously performing your lower  abdominal contraction. Do 5-10 on each leg.  Marland Kitchen    Housework - Vacuuming   Hold the vacuum with arm held at side. Step back and forth to move it, keeping head up. Avoid twisting.   Copyright  VHI. All rights reserved.  Housework - Industrial/product designer for hard-to-reach places so as to avoid straining.   Copyright  VHI. All rights reserved.  Housework - Sweeping   Use long-handled equipment to avoid stooping.   Copyright  VHI. All rights reserved.  Housework - Bed   Use light bedding, such as a down comforter. Place one knee up on bed to reach when making bed. Use extra-depth fitted sheets, and squat down when tucking corners.   Copyright  VHI. All rights reserved.  Housework - Sink   Place one foot on ledge of cabinet under sink when standing at sink for prolonged periods.   Copyright  VHI. All rights reserved.  Lifting Principles .Maintain proper posture and head alignment. .Slide object as close as possible before lifting. .Move obstacles out of the way. .Test before lifting; ask for help if too heavy. .Tighten stomach muscles without holding breath. .Use smooth movements; do not jerk. .Use legs to do the work, and pivot with feet. .Distribute the work load symmetrically and close to the center of trunk. .Push instead of pull whenever possible.  Copyright  VHI. All rights reserved.  Cart   When reaching into cart with one arm, lift opposite leg to keep back straight.  Copyright  VHI. All rights reserved.  Getting Into / Out of Car   Lower self onto seat, scoot back, then bring in one leg at a time. Reverse sequence to get out.   Copyright  VHI. All rights reserved.  [place] [address], [place], [place] [address] Phone # [phone] Fax [phone]

## 2015-04-05 NOTE — Therapy (Signed)
[facility] [address] [place], [place], [address] Phone: [phone]   Fax:  [phone]  Physical Therapy Treatment  Patient Details  Name: Holly Richard MRN: [id number] Date of Birth: [birth year] Referring Provider:  Jovita Gamma, MD  Encounter Date: 04/05/2015      PT End of Session - 04/05/15 1407    Visit Number 2   Number of Visits 10  Medicare   Date for PT Re-Evaluation 05/03/15   PT Start Time 1400   PT Stop Time 1445   PT Time Calculation (min) 45 min   Activity Tolerance Patient tolerated treatment well   Behavior During Therapy [facility] for tasks assessed/performed      Past Medical History  Diagnosis Date  . MIGRAINE HEADACHE   . MENIERE'S DISEASE   . DIVERTICULOSIS, COLON   . ARTHRITIS   . OSTEOPENIA   . ALLERGIC RHINITIS   . HYPERLIPIDEMIA   . HYPOTHYROIDISM   . Hepatitis C antibody test positive 11/2014    negative RNA confirmation test    Past Surgical History  Procedure Laterality Date  . Tonsillectomy  1954  . Bunionectomy      X's - (R) 2/04 & (L) 3/04  . (r) ear implants  10/2008    Hearing loss  . Lumbar laminectomy/decompression microdiscectomy Left 12/28/2014    Procedure: LUMBAR LAMINECTOMY/DECOMPRESSION MICRODISCECTOMY 2 LEVELS;  Surgeon: Jovita Gamma, MD;  Location: [place] [facility];  Service: Neurosurgery;  Laterality: Left;  Left L34 laminotomy, foraminotomy and poss microdiskectomy with left L45 laminotomy, foraminotomy, resection of synovial cyst and poss microdiskectomy    There were no vitals filed for this visit.  Visit Diagnosis:  Bilateral low back pain without sciatica      Subjective Assessment - 04/05/15 1407    How long can you stand comfortably? 1-2 hours without housework   Patient Stated Goals improve mobiltiy/flexibility, reduce soreness, return to yardwork   Currently in Pain? Yes   Pain Score 6    Pain Location Back   Pain Orientation  Right;Left;Lower   Pain Descriptors / Indicators Sore   Pain Type Acute pain   Pain Onset More than a month ago   Pain Frequency Intermittent   Aggravating Factors  bending and standing   Pain Relieving Factors ibuprofen, sitting down   Multiple Pain Sites No                         [facility] Adult PT Treatment/Exercise - 04/05/15 0001    Lumbar Exercises: Supine   Ab Set 5 reps;5 seconds   Clam 10 reps  each leg with core stabilization   Manual Therapy   Manual Therapy Soft tissue mobilization   Soft tissue mobilization bil. lumbar and right gluteals in prone                PT Education - 04/05/15 1409    Education provided Yes   Education Details reviewed past HEP and able to return demonstration correctly, abdominal bracing, abdominal bracing with hip movement   Person(s) Educated Patient   Methods Demonstration;Verbal cues;Handout   Comprehension Verbalized understanding;Returned demonstration             PT Long Term Goals - 04/05/15 1443    PT LONG TERM GOAL #1   Title be independent in advanced HEP   Time 5   Period Weeks   Status On-going   PT LONG TERM GOAL #2  Title reduce FOTO to < or = to 39% limitation   Time 5   Period Weeks   Status On-going   PT LONG TERM GOAL #3   Title verbalize understanding of body mechanics modifications to protect spine with ADLs, housework and yardwork   Time 5   Period Weeks   Status On-going   PT LONG TERM GOAL #4   Title report < or  = to 1/10 LBP with yardwork and houswork activity   Time 5   Period Weeks   Status On-going               Plan - 04/05/15 1440    Clinical Impression Statement Patient is a 74 year old female with s/p lumbar laminectomy, decompression and 2 level microdisectomy 12/28/2014.  Patient has increased muscle tightness in right piriformis, lumbar paraspinals, and right SI area. Patient is independent with her initial HEP.  Paitent is learning correct body mechanics  with daily tasks. Patient will benefit from physical therapy to imporve core strength.    Pt will benefit from skilled therapeutic intervention in order to improve on the following deficits Pain;Decreased strength;Improper body mechanics;Decreased activity tolerance;Impaired flexibility   Rehab Potential Good   PT Frequency 2x / week   PT Duration 4 weeks   PT Treatment/Interventions ADLs/Self Care Home Management;Electrical Stimulation;Moist Heat;Therapeutic exercise;Therapeutic activities;Functional mobility training;Ultrasound;Neuromuscular re-education;Patient/family education;Manual techniques;Passive range of motion   PT Next Visit Plan core strength, nustep   PT Home Exercise Plan body mechanics with yardwork   Recommended Other Services None   Consulted and Agree with Plan of Care Patient        Problem List Patient Active Problem List   Diagnosis Date Noted  . Lumbar stenosis 12/28/2014  . Hepatitis C antibody test positive 12/07/2014  . Left sided sciatica 11/04/2012  . Hypothyroidism 07/26/2010  . Hyperlipidemia 07/26/2010  . MIGRAINE HEADACHE 07/26/2010  . [place] DISEASE 07/26/2010  . ARTHRITIS 07/26/2010  . ALLERGIC RHINITIS 07/05/2010  . DIVERTICULOSIS, COLON 07/05/2010  . OSTEOPENIA 07/05/2010    GRAY,CHERYL,PT 04/05/2015, 2:45 PM  [facility] [address] [place], [place], [address] Phone: [phone]   Fax:  [phone]

## 2015-04-10 ENCOUNTER — Encounter: Payer: Self-pay | Admitting: Physical Therapy

## 2015-04-10 ENCOUNTER — Ambulatory Visit: Payer: Medicare Other | Attending: Neurosurgery | Admitting: Physical Therapy

## 2015-04-10 DIAGNOSIS — M545 Low back pain, unspecified: Secondary | ICD-10-CM

## 2015-04-10 NOTE — Therapy (Signed)
[facility] [address], [place], [place], [address] Phone: [phone]   Fax:  [phone]  Physical Therapy Treatment  Patient Details  Name: Holly Richard MRN: [id number] Date of Birth: Jun 21, 1941 Referring Provider:  Jovita Gamma, MD  Encounter Date: 04/10/2015      PT End of Session - 04/10/15 1148    Visit Number 3   Number of Visits 10  Medicare   Date for PT Re-Evaluation 05/03/15   PT Start Time 2979   PT Stop Time 1225   PT Time Calculation (min) 40 min   Activity Tolerance Patient tolerated treatment well   Behavior During Therapy [facility] for tasks assessed/performed      Past Medical History  Diagnosis Date  . MIGRAINE HEADACHE   . MENIERE'S DISEASE   . DIVERTICULOSIS, COLON   . ARTHRITIS   . OSTEOPENIA   . ALLERGIC RHINITIS   . HYPERLIPIDEMIA   . HYPOTHYROIDISM   . Hepatitis C antibody test positive 11/2014    negative RNA confirmation test    Past Surgical History  Procedure Laterality Date  . Tonsillectomy  1954  . Bunionectomy      X's - (R) 2/04 & (L) 3/04  . (r) ear implants  10/2008    Hearing loss  . Lumbar laminectomy/decompression microdiscectomy Left 12/28/2014    Procedure: LUMBAR LAMINECTOMY/DECOMPRESSION MICRODISCECTOMY 2 LEVELS;  Surgeon: Jovita Gamma, MD;  Location: [place] [facility];  Service: Neurosurgery;  Laterality: Left;  Left L34 laminotomy, foraminotomy and poss microdiskectomy with left L45 laminotomy, foraminotomy, resection of synovial cyst and poss microdiskectomy    There were no vitals filed for this visit.  Visit Diagnosis:  Bilateral low back pain without sciatica      Subjective Assessment - 04/10/15 1148    Subjective I still have the soreness in my left buttocks. I felt good after last visit.    Limitations Standing   How long can you stand comfortably? 1-2 hours without housework   Patient Stated Goals improve mobiltiy/flexibility, reduce  soreness, return to yardwork   Currently in Pain? Yes   Pain Score 5    Pain Location Buttocks   Pain Orientation Left;Right  worse on left   Pain Descriptors / Indicators Sore   Pain Type Acute pain   Pain Onset More than a month ago   Pain Frequency Intermittent   Aggravating Factors  bending and standing   Pain Relieving Factors ibprofen, sitting down   Multiple Pain Sites No                         [facility] Adult PT Treatment/Exercise - 04/10/15 0001    Lumbar Exercises: Stretches   Passive Hamstring Stretch 2 reps;30 seconds   Single Knee to Chest Stretch 2 reps;20 seconds  right , left   Piriformis Stretch 2 reps;30 seconds  right , left   Lumbar Exercises: Aerobic   Stationary Bike nustep level 1 5 min   Lumbar Exercises: Supine   Clam 10 reps  each leg with core stabilization   Bent Knee Raise 15 reps  right, left with abdominal bracing   Bridge 10 reps;1 second  feels the muscles working   Straight Leg Raise 10 reps  right, left abdominal bracing   Lumbar Exercises: Quadruped   [place] 15 reps  verbal cues to not bend elbows   Single Arm Raise 10 reps  right, left abdominal bracing   Manual  Therapy   Manual Therapy Soft tissue mobilization   Soft tissue mobilization bil. lumbar and right gluteals in prone                PT Education - 04/10/15 1221    Education provided Yes   Education Details hamstring, piriformis, SKC   Person(s) Educated Patient   Methods Explanation;Demonstration;Tactile cues;Handout;Verbal cues   Comprehension Returned demonstration;Verbalized understanding             PT Long Term Goals - 04/10/15 1222    PT LONG TERM GOAL #1   Title be independent in advanced HEP   Time 5   Period Weeks   Status On-going  still learing HEP   PT LONG TERM GOAL #2   Title reduce FOTO to < or = to 39% limitation   Time 5   Period Weeks   Status On-going   PT LONG TERM GOAL #4   Title report < or  = to  1/10 LBP with yardwork and houswork activity   Time 5   Period Weeks   Status On-going  continues to have pain               Plan - 04/10/15 1223    Clinical Impression Statement Patient is a 74 year old female s/P lumbar laminectomy, decompression and 2 level microdisectomy 12/28/2014.  Patient is working on core strength, Patient has learned flexibility exercises.  Patient is unable to do alot of walking due to taking care of her husband that had a TKR. Patient would benefit from phsycial therapy to reduce pain and increase strength.    Pt will benefit from skilled therapeutic intervention in order to improve on the following deficits Pain;Decreased strength;Improper body mechanics;Decreased activity tolerance;Impaired flexibility   Rehab Potential Good   PT Frequency 2x / week   PT Duration 4 weeks   PT Treatment/Interventions ADLs/Self Care Home Management;Electrical Stimulation;Moist Heat;Therapeutic exercise;Therapeutic activities;Functional mobility training;Ultrasound;Neuromuscular re-education;Patient/family education;Manual techniques;Passive range of motion   PT Next Visit Plan core strength, nustep; soft tissue work   PT Home Exercise Plan body mechanics with yardwork   Consulted and Agree with Plan of Care Patient        Problem List Patient Active Problem List   Diagnosis Date Noted  . Lumbar stenosis 12/28/2014  . Hepatitis C antibody test positive 12/07/2014  . Left sided sciatica 11/04/2012  . Hypothyroidism 07/26/2010  . Hyperlipidemia 07/26/2010  . MIGRAINE HEADACHE 07/26/2010  . [place] DISEASE 07/26/2010  . ARTHRITIS 07/26/2010  . ALLERGIC RHINITIS 07/05/2010  . DIVERTICULOSIS, COLON 07/05/2010  . OSTEOPENIA 07/05/2010    Shahram Alexopoulos,PT 04/10/2015, 12:26 PM   [facility] [address] [place], [place], [address] Phone: [phone]   Fax:  [phone]

## 2015-04-13 ENCOUNTER — Encounter: Payer: Self-pay | Admitting: Physical Therapy

## 2015-04-13 ENCOUNTER — Ambulatory Visit: Payer: Medicare Other | Admitting: Physical Therapy

## 2015-04-13 DIAGNOSIS — M545 Low back pain, unspecified: Secondary | ICD-10-CM

## 2015-04-13 NOTE — Therapy (Signed)
[facility] [address], [place], [place], [address] Phone: [phone]   Fax:  [phone]  Physical Therapy Treatment  Patient Details  Name: Holly Richard MRN: [id number] Date of Birth: [birth year] Referring Provider:  Jovita Gamma, MD  Encounter Date: 04/13/2015      PT End of Session - 04/13/15 1226    Visit Number 4   Number of Visits 10  Medicare   Date for PT Re-Evaluation 05/03/15   PT Start Time 7782   PT Stop Time 1226   PT Time Calculation (min) 41 min   Activity Tolerance Patient tolerated treatment well   Behavior During Therapy [facility] for tasks assessed/performed      Past Medical History  Diagnosis Date  . MIGRAINE HEADACHE   . MENIERE'S DISEASE   . DIVERTICULOSIS, COLON   . ARTHRITIS   . OSTEOPENIA   . ALLERGIC RHINITIS   . HYPERLIPIDEMIA   . HYPOTHYROIDISM   . Hepatitis C antibody test positive 11/2014    negative RNA confirmation test    Past Surgical History  Procedure Laterality Date  . Tonsillectomy  1954  . Bunionectomy      X's - (R) 2/04 & (L) 3/04  . (r) ear implants  10/2008    Hearing loss  . Lumbar laminectomy/decompression microdiscectomy Left 12/28/2014    Procedure: LUMBAR LAMINECTOMY/DECOMPRESSION MICRODISCECTOMY 2 LEVELS;  Surgeon: Jovita Gamma, MD;  Location: [place] [facility];  Service: Neurosurgery;  Laterality: Left;  Left L34 laminotomy, foraminotomy and poss microdiskectomy with left L45 laminotomy, foraminotomy, resection of synovial cyst and poss microdiskectomy    There were no vitals filed for this visit.  Visit Diagnosis:  Bilateral low back pain without sciatica      Subjective Assessment - 04/13/15 1157    Subjective I still have soreness in buttocks.  The exercises are helping   Limitations Standing   How long can you stand comfortably? 1-2 hours without housework   Patient Stated Goals improve mobiltiy/flexibility, reduce soreness, return to  yardwork   Currently in Pain? Yes   Pain Score 2    Pain Location Buttocks   Pain Orientation Right;Left   Pain Descriptors / Indicators Sore   Pain Type Acute pain   Pain Onset More than a month ago   Pain Frequency Intermittent   Aggravating Factors  standing, bending   Pain Relieving Factors as the day goes on, exercise   Multiple Pain Sites No                         [facility] Adult PT Treatment/Exercise - 04/13/15 0001    Lumbar Exercises: Stretches   Single Knee to Chest Stretch 2 reps;20 seconds  right , left   Lumbar Exercises: Aerobic   Stationary Bike nustep level 1 6 min   Lumbar Exercises: Standing   Other Standing Lumbar Exercises sitting on red plyoball- bounce, alterante shoulder flexion, alternate hip flexion, alternate knee extension,    Lumbar Exercises: Supine   Clam 10 reps  each leg with core stabilization   Bridge 10 reps;5 seconds   Shoulder Exercises: Seated   Horizontal ABduction Both;10 reps;Theraband   Theraband Level (Shoulder Horizontal ABduction) Level 1 (Yellow)   Horizontal ABduction Limitations sitting on red physioballl   External Rotation Both;20 reps;Theraband   Theraband Level (Shoulder External Rotation) Level 1 (Yellow)   External Rotation Limitations sit on red physioball   Other Seated Exercises sit on red  physioball diagonal with shoulders using yellow band 10x each way   Manual Therapy   Manual Therapy Soft tissue mobilization   Soft tissue mobilization bil. lumbar and right gluteals in prone                PT Education - 04/13/15 1226    Education provided No             PT Long Term Goals - 04/10/15 1222    PT LONG TERM GOAL #1   Title be independent in advanced HEP   Time 5   Period Weeks   Status On-going  still learing HEP   PT LONG TERM GOAL #2   Title reduce FOTO to < or = to 39% limitation   Time 5   Period Weeks   Status On-going   PT LONG TERM GOAL #4   Title report < or  = to 1/10  LBP with yardwork and houswork activity   Time 5   Period Weeks   Status On-going  continues to have pain               Plan - 04/13/15 1226    Clinical Impression Statement Patient is a 74 year old female s/p lumbar laminectomy, decompression and 2 level mirodisectomy 12/28/2014.  Patient is working on core strength in sitting using yellow band without increased pain.  Paitent continues to have soreness located in gluteals and lumbar paraspinals.  Patient would benefit from physical therapy to reduce pain and increase strength.    Pt will benefit from skilled therapeutic intervention in order to improve on the following deficits Pain;Decreased strength;Improper body mechanics;Decreased activity tolerance;Impaired flexibility   Rehab Potential Good   PT Frequency 2x / week   PT Duration 4 weeks   PT Treatment/Interventions ADLs/Self Care Home Management;Electrical Stimulation;Moist Heat;Therapeutic exercise;Therapeutic activities;Functional mobility training;Ultrasound;Neuromuscular re-education;Patient/family education;Manual techniques;Passive range of motion   PT Home Exercise Plan body mechanics with yardwork; yellow interscapular strengthening   Consulted and Agree with Plan of Care Patient        Problem List Patient Active Problem List   Diagnosis Date Noted  . Lumbar stenosis 12/28/2014  . Hepatitis C antibody test positive 12/07/2014  . Left sided sciatica 11/04/2012  . Hypothyroidism 07/26/2010  . Hyperlipidemia 07/26/2010  . MIGRAINE HEADACHE 07/26/2010  . [place] DISEASE 07/26/2010  . ARTHRITIS 07/26/2010  . ALLERGIC RHINITIS 07/05/2010  . DIVERTICULOSIS, COLON 07/05/2010  . OSTEOPENIA 07/05/2010    GRAY,CHERYL,PT 04/13/2015, 12:29 PM  [facility] [address] [place], [place], [address] Phone: [phone]   Fax:  [phone]

## 2015-04-17 DIAGNOSIS — Z23 Encounter for immunization: Secondary | ICD-10-CM | POA: Diagnosis not present

## 2015-04-18 ENCOUNTER — Ambulatory Visit: Payer: Medicare Other

## 2015-04-18 DIAGNOSIS — M545 Low back pain, unspecified: Secondary | ICD-10-CM

## 2015-04-18 NOTE — Therapy (Signed)
[facility] [address] [place], [place], [address] Phone: [phone]   Fax:  [phone]  Physical Therapy Treatment  Patient Details  Name: Holly Richard MRN: [id number] Date of Birth: [birth year] Referring Provider:  Jovita Gamma, MD  Encounter Date: 04/18/2015      PT End of Session - 04/18/15 1053    Visit Number 5   Number of Visits 10   Date for PT Re-Evaluation 05/03/15   PT Start Time 1016   PT Stop Time 1058   PT Time Calculation (min) 42 min   Activity Tolerance Patient tolerated treatment well   Behavior During Therapy [facility] for tasks assessed/performed      Past Medical History  Diagnosis Date  . MIGRAINE HEADACHE   . MENIERE'S DISEASE   . DIVERTICULOSIS, COLON   . ARTHRITIS   . OSTEOPENIA   . ALLERGIC RHINITIS   . HYPERLIPIDEMIA   . HYPOTHYROIDISM   . Hepatitis C antibody test positive 11/2014    negative RNA confirmation test    Past Surgical History  Procedure Laterality Date  . Tonsillectomy  1954  . Bunionectomy      X's - (R) 2/04 & (L) 3/04  . (r) ear implants  10/2008    Hearing loss  . Lumbar laminectomy/decompression microdiscectomy Left 12/28/2014    Procedure: LUMBAR LAMINECTOMY/DECOMPRESSION MICRODISCECTOMY 2 LEVELS;  Surgeon: Jovita Gamma, MD;  Location: [place] [facility];  Service: Neurosurgery;  Laterality: Left;  Left L34 laminotomy, foraminotomy and poss microdiskectomy with left L45 laminotomy, foraminotomy, resection of synovial cyst and poss microdiskectomy    There were no vitals filed for this visit.  Visit Diagnosis:  Bilateral low back pain without sciatica      Subjective Assessment - 04/18/15 1021    Subjective Pt still with soreness in the Lt SI joint.  Doing well with exercises   Patient Stated Goals improve mobiltiy/flexibility, reduce soreness, return to yardwork   Currently in Pain? Yes   Pain Score 2    Pain Location Buttocks   Pain Orientation  Left   Pain Descriptors / Indicators Sore   Pain Type Acute pain   Pain Onset More than a month ago   Pain Frequency Intermittent   Aggravating Factors  early morning hours, standing, walking   Pain Relieving Factors as the day progresses, stretching                         [facility] Adult PT Treatment/Exercise - 04/18/15 0001    Exercises   Exercises Knee/Hip   Lumbar Exercises: Stretches   Active Hamstring Stretch 3 reps;20 seconds   Lumbar Exercises: Aerobic   Stationary Bike nustep level 1 8 min  seat 5, arms 9   UBE (Upper Arm Bike) Level 1 seasted x 6 minutes (3/3)   Lumbar Exercises: Supine   Clam 10 reps  each leg with core stabilization   Bridge 10 reps;5 seconds   Straight Leg Raise 20 reps  right, left abdominal bracing   Other Supine Lumbar Exercises horizontal abduction with red band 2x10 with abdominal bracing   Knee/Hip Exercises: Standing   Rebounder weight shifting with abdominal bracing 3x61minute                     PT Long Term Goals - 04/18/15 1028    PT LONG TERM GOAL #1   Title be independent in advanced HEP   Time 5  Period Weeks   Status On-going   PT LONG TERM GOAL #2   Title reduce FOTO to < or = to 39% limitation   Time 5   Period Weeks   Status On-going   PT LONG TERM GOAL #3   Title verbalize understanding of body mechanics modifications to protect spine with ADLs, housework and yardwork   Status Achieved   PT LONG TERM GOAL #4   Title report < or  = to 1/10 LBP with yardwork and houswork activity   Time 5   Period Weeks   Status On-going  no pain with housework, hasnt tried [employer]               Plan - 04/18/15 1044    Clinical Impression Statement Pt with continued mild Lt gluteal pain and reports that she is feeling stronger in her core.  Pt reports that she has not done yardwork to assess this goal. Pt denies any significant pain with housework and has been using neutral mechanics especially with  vacuuming.  Pt able to stabilize with abdominals with sidelying clamshells today.  Pt will benefit from continued PT for strength, flexibility and endurance.   Pt will benefit from skilled therapeutic intervention in order to improve on the following deficits Pain;Decreased strength;Improper body mechanics;Decreased activity tolerance;Impaired flexibility   Rehab Potential Good   PT Frequency 2x / week   PT Duration 4 weeks   PT Treatment/Interventions ADLs/Self Care Home Management;Electrical Stimulation;Moist Heat;Therapeutic exercise;Therapeutic activities;Functional mobility training;Ultrasound;Neuromuscular re-education;Patient/family education;Manual techniques;Passive range of motion   PT Next Visit Plan core strength, endurance and flexibility.  Soft tissue work as needed.   Consulted and Agree with Plan of Care Patient        Problem List Patient Active Problem List   Diagnosis Date Noted  . Lumbar stenosis 12/28/2014  . Hepatitis C antibody test positive 12/07/2014  . Left sided sciatica 11/04/2012  . Hypothyroidism 07/26/2010  . Hyperlipidemia 07/26/2010  . MIGRAINE HEADACHE 07/26/2010  . [place] DISEASE 07/26/2010  . ARTHRITIS 07/26/2010  . ALLERGIC RHINITIS 07/05/2010  . DIVERTICULOSIS, COLON 07/05/2010  . OSTEOPENIA 07/05/2010    Kady Toothaker, PT 04/18/2015, 10:54 AM  [facility] [address], [place], [place], [address] Phone: [phone]   Fax:  [phone]

## 2015-04-21 ENCOUNTER — Ambulatory Visit: Payer: Medicare Other | Admitting: Physical Therapy

## 2015-04-21 ENCOUNTER — Encounter: Payer: Self-pay | Admitting: Physical Therapy

## 2015-04-21 DIAGNOSIS — M545 Low back pain, unspecified: Secondary | ICD-10-CM

## 2015-04-21 NOTE — Therapy (Signed)
[facility] [address], [place], [place], [address] Phone: [phone]   Fax:  [phone]  Physical Therapy Treatment  Patient Details  Name: Holly Richard MRN: [id number] Date of Birth: 1941/02/04 No Data Recorded  Encounter Date: 04/21/2015      PT End of Session - 04/21/15 1035    Visit Number 6   Number of Visits 10   Date for PT Re-Evaluation 05/03/15   PT Start Time 7672   PT Stop Time 1058   PT Time Calculation (min) 43 min   Activity Tolerance Patient tolerated treatment well   Behavior During Therapy [facility] for tasks assessed/performed      Past Medical History  Diagnosis Date  . MIGRAINE HEADACHE   . MENIERE'S DISEASE   . DIVERTICULOSIS, COLON   . ARTHRITIS   . OSTEOPENIA   . ALLERGIC RHINITIS   . HYPERLIPIDEMIA   . HYPOTHYROIDISM   . Hepatitis C antibody test positive 11/2014    negative RNA confirmation test    Past Surgical History  Procedure Laterality Date  . Tonsillectomy  1954  . Bunionectomy      X's - (R) 2/04 & (L) 3/04  . (r) ear implants  10/2008    Hearing loss  . Lumbar laminectomy/decompression microdiscectomy Left 12/28/2014    Procedure: LUMBAR LAMINECTOMY/DECOMPRESSION MICRODISCECTOMY 2 LEVELS;  Surgeon: Jovita Gamma, MD;  Location: [place] [facility];  Service: Neurosurgery;  Laterality: Left;  Left L34 laminotomy, foraminotomy and poss microdiskectomy with left L45 laminotomy, foraminotomy, resection of synovial cyst and poss microdiskectomy    There were no vitals filed for this visit.  Visit Diagnosis:  Bilateral low back pain without sciatica      Subjective Assessment - 04/21/15 1017    Subjective Pt reports the last two days she felt great, she has the feeling being on the right track   Currently in Pain? Yes   Pain Score 2    Pain Location Buttocks   Pain Orientation Left   Pain Descriptors / Indicators Sore   Pain Type Acute pain   Pain Onset More than a  month ago   Pain Frequency Intermittent                         [facility] Adult PT Treatment/Exercise - 04/21/15 0001    Exercises   Exercises Knee/Hip   Lumbar Exercises: Stretches   Active Hamstring Stretch 2 reps;30 seconds   Lumbar Exercises: Aerobic   Stationary Bike nustep level 1 8 min  seat#5, arms # 9   Lumbar Exercises: Supine   Clam 20 reps  with core stabilization   Bridge 20 reps;5 seconds   Straight Leg Raise --   Other Supine Lumbar Exercises horizontal abduction with red band 2x10 with abdominal bracing   Other Supine Lumbar Exercises Hip ABD in sidelying each side x 20  needed tactiel and Vc's to prevent compensatory movement                     PT Long Term Goals - 04/18/15 1028    PT LONG TERM GOAL #1   Title be independent in advanced HEP   Time 5   Period Weeks   Status On-going   PT LONG TERM GOAL #2   Title reduce FOTO to < or = to 39% limitation   Time 5   Period Weeks   Status On-going   PT LONG TERM  GOAL #3   Title verbalize understanding of body mechanics modifications to protect spine with ADLs, housework and yardwork   Status Achieved   PT LONG TERM GOAL #4   Title report < or  = to 1/10 LBP with yardwork and houswork activity   Time 5   Period Weeks   Status On-going  no pain with housework, hasnt tried [employer]               Plan - 04/21/15 1036    Clinical Impression Statement Pt continues to complain of soreness in low back and left buttocks area. Pt reports she was riding the lawnmower and had no issues. Pt will continue to benefit from skilled PT for strength, flexibility and endurance   Pt will benefit from skilled therapeutic intervention in order to improve on the following deficits Pain;Decreased strength;Improper body mechanics;Decreased activity tolerance;Impaired flexibility   Rehab Potential Good   PT Frequency 2x / week   PT Duration 4 weeks   PT Treatment/Interventions ADLs/Self Care Home  Management;Electrical Stimulation;Moist Heat;Therapeutic exercise;Therapeutic activities;Functional mobility training;Ultrasound;Neuromuscular re-education;Patient/family education;Manual techniques;Passive range of motion   PT Next Visit Plan continue with core strength and flexibility.    Consulted and Agree with Plan of Care Patient        Problem List Patient Active Problem List   Diagnosis Date Noted  . Lumbar stenosis 12/28/2014  . Hepatitis C antibody test positive 12/07/2014  . Left sided sciatica 11/04/2012  . Hypothyroidism 07/26/2010  . Hyperlipidemia 07/26/2010  . MIGRAINE HEADACHE 07/26/2010  . [place] DISEASE 07/26/2010  . ARTHRITIS 07/26/2010  . ALLERGIC RHINITIS 07/05/2010  . DIVERTICULOSIS, COLON 07/05/2010  . OSTEOPENIA 07/05/2010    NAUMANN-HOUEGNIFIO,[name] PTA 04/21/2015, 10:53 AM  [facility] [address], [place], [place], [address] Phone: [phone]   Fax:  [phone]  Name: Holly Richard MRN: [id number] Date of Birth: [birth year]

## 2015-04-24 ENCOUNTER — Ambulatory Visit: Payer: Medicare Other

## 2015-04-24 DIAGNOSIS — M545 Low back pain, unspecified: Secondary | ICD-10-CM

## 2015-04-24 DIAGNOSIS — L259 Unspecified contact dermatitis, unspecified cause: Secondary | ICD-10-CM | POA: Diagnosis not present

## 2015-04-24 NOTE — Therapy (Signed)
[facility] [address], [place], [place], [address] Phone: [phone]   Fax:  [phone]  Physical Therapy Treatment  Patient Details  Name: Holly Richard MRN: [id number] Date of Birth: 06-11-1941 Referring Provider: Dr Jovita Gamma  Encounter Date: 04/24/2015      PT End of Session - 04/24/15 1136    Visit Number 7   Number of Visits 10   Date for PT Re-Evaluation 05/03/15   PT Start Time 1100   PT Stop Time 1134   PT Time Calculation (min) 34 min   Activity Tolerance Patient tolerated treatment well   Behavior During Therapy [facility] for tasks assessed/performed      Past Medical History  Diagnosis Date  . MIGRAINE HEADACHE   . MENIERE'S DISEASE   . DIVERTICULOSIS, COLON   . ARTHRITIS   . OSTEOPENIA   . ALLERGIC RHINITIS   . HYPERLIPIDEMIA   . HYPOTHYROIDISM   . Hepatitis C antibody test positive 11/2014    negative RNA confirmation test    Past Surgical History  Procedure Laterality Date  . Tonsillectomy  1954  . Bunionectomy      X's - (R) 2/04 & (L) 3/04  . (r) ear implants  10/2008    Hearing loss  . Lumbar laminectomy/decompression microdiscectomy Left 12/28/2014    Procedure: LUMBAR LAMINECTOMY/DECOMPRESSION MICRODISCECTOMY 2 LEVELS;  Surgeon: Jovita Gamma, MD;  Location: [place] [facility];  Service: Neurosurgery;  Laterality: Left;  Left L34 laminotomy, foraminotomy and poss microdiskectomy with left L45 laminotomy, foraminotomy, resection of synovial cyst and poss microdiskectomy    There were no vitals filed for this visit.  Visit Diagnosis:  Bilateral low back pain without sciatica      Subjective Assessment - 04/24/15 1103    Subjective 90% overall improvement since the start of care.  Next visit is her last one.     Patient Stated Goals improve mobiltiy/flexibility, reduce soreness, return to yardwork   Currently in Pain? Yes   Pain Score 2    Pain Location Buttocks   Pain  Orientation Left   Pain Descriptors / Indicators Sore   Pain Onset More than a month ago   Pain Frequency Intermittent   Aggravating Factors  early morning hours, standing, walking   Pain Relieving Factors as the day progresses, stretching            [facility] PT Assessment - 04/24/15 0001    Assessment   Referring Provider Dr Jovita Gamma                     [facility] Adult PT Treatment/Exercise - 04/24/15 0001    Lumbar Exercises: Stretches   Active Hamstring Stretch 2 reps;30 seconds  seated and with strap   Single Knee to Chest Stretch 2 reps;20 seconds  right , left   Lumbar Exercises: Aerobic   Stationary Bike nustep level 1 8 min  seat#5, arms # 9   UBE (Upper Arm Bike) --   Lumbar Exercises: Supine   Bridge 20 reps;5 seconds   Straight Leg Raise 20 reps  right, left abdominal bracing   Other Supine Lumbar Exercises horizontal abduction with red band 2x10 with abdominal bracing   Lumbar Exercises: Sidelying   Clam 20 reps                     PT Long Term Goals - 04/24/15 1116    PT LONG TERM GOAL #1  Title be independent in advanced HEP   Time 5   Period Weeks   Status On-going   PT LONG TERM GOAL #2   Title reduce FOTO to < or = to 39% limitation   Status Achieved   PT LONG TERM GOAL #3   Title verbalize understanding of body mechanics modifications to protect spine with ADLs, housework and yardwork   PT LONG TERM GOAL #4   Title report < or  = to 1/10 LBP with yardwork and houswork activity   Status Achieved               Plan - 04/24/15 1117    Clinical Impression Statement Pt reports 90% overall improvement since the start of care and is consistent with HEP.  Pt transplanted a shrub in her yard over the weekend and didn't have additional pain.  Pt with good body mechanics awareness and is using with home tasks to protect the spine.  Pt will be discharged next session to HEP.   Pt will benefit from skilled therapeutic  intervention in order to improve on the following deficits Pain;Decreased strength;Improper body mechanics;Decreased activity tolerance;Impaired flexibility   PT Frequency 2x / week   PT Duration 4 weeks   PT Treatment/Interventions ADLs/Self Care Home Management;Electrical Stimulation;Moist Heat;Therapeutic exercise;Therapeutic activities;Functional mobility training;Ultrasound;Neuromuscular re-education;Patient/family education;Manual techniques;Passive range of motion   PT Next Visit Plan 1 more sesison.  Finalize HEP.  FOTO done today.  Report G-codes   Consulted and Agree with Plan of Care Patient        Problem List Patient Active Problem List   Diagnosis Date Noted  . Lumbar stenosis 12/28/2014  . Hepatitis C antibody test positive 12/07/2014  . Left sided sciatica 11/04/2012  . Hypothyroidism 07/26/2010  . Hyperlipidemia 07/26/2010  . MIGRAINE HEADACHE 07/26/2010  . [place] DISEASE 07/26/2010  . ARTHRITIS 07/26/2010  . ALLERGIC RHINITIS 07/05/2010  . DIVERTICULOSIS, COLON 07/05/2010  . OSTEOPENIA 07/05/2010    Harlem Thresher, PT 04/24/2015, 11:39 AM  [facility] [address], [place], [place], [address] Phone: [phone]   Fax:  [phone]  Name: Holly Richard MRN: [id number] Date of Birth: 23-Aug-1940

## 2015-04-27 ENCOUNTER — Ambulatory Visit: Payer: Medicare Other

## 2015-04-27 DIAGNOSIS — H16223 Keratoconjunctivitis sicca, not specified as Sjogren's, bilateral: Secondary | ICD-10-CM | POA: Diagnosis not present

## 2015-04-27 DIAGNOSIS — M545 Low back pain, unspecified: Secondary | ICD-10-CM

## 2015-04-27 DIAGNOSIS — H04123 Dry eye syndrome of bilateral lacrimal glands: Secondary | ICD-10-CM | POA: Diagnosis not present

## 2015-04-27 NOTE — Patient Instructions (Signed)
Bridge    Lie back, legs bent. Inhale, pressing hips up. Keeping ribs in, lengthen lower back. Exhale, rolling down along spine from top.  Hold 5 seconds Repeat _2x10___ times. Do _1___ sessions per day.  Abduction: Clam (Eccentric) - Side-Lying    Lie on side with knees bent. Lift top knee, keeping feet together. Keep trunk steady. Slowly lower for 3-5 seconds. 2x10__ reps per set, __1_ sets per day  [facility] [address], [place], [place] [address] Phone # [phone] Fax [phone]

## 2015-04-27 NOTE — Therapy (Signed)
[facility] [address] [place], [place], [address] Phone: [phone]   Fax:  [phone]  Physical Therapy Treatment  Patient Details  Name: Holly Richard MRN: [id number] Date of Birth: 24-Jun-1941 Referring Provider: Dr Jovita Gamma  Encounter Date: 04/27/2015      PT End of Session - 04/27/15 0834    Visit Number 8   PT Start Time 0803   PT Stop Time 0837   PT Time Calculation (min) 34 min   Activity Tolerance Patient tolerated treatment well   Behavior During Therapy [facility] for tasks assessed/performed      Past Medical History  Diagnosis Date  . MIGRAINE HEADACHE   . MENIERE'S DISEASE   . DIVERTICULOSIS, COLON   . ARTHRITIS   . OSTEOPENIA   . ALLERGIC RHINITIS   . HYPERLIPIDEMIA   . HYPOTHYROIDISM   . Hepatitis C antibody test positive 11/2014    negative RNA confirmation test    Past Surgical History  Procedure Laterality Date  . Tonsillectomy  1954  . Bunionectomy      X's - (R) 2/04 & (L) 3/04  . (r) ear implants  10/2008    Hearing loss  . Lumbar laminectomy/decompression microdiscectomy Left 12/28/2014    Procedure: LUMBAR LAMINECTOMY/DECOMPRESSION MICRODISCECTOMY 2 LEVELS;  Surgeon: Jovita Gamma, MD;  Location: [place] [facility];  Service: Neurosurgery;  Laterality: Left;  Left L34 laminotomy, foraminotomy and poss microdiskectomy with left L45 laminotomy, foraminotomy, resection of synovial cyst and poss microdiskectomy    There were no vitals filed for this visit.  Visit Diagnosis:  Bilateral low back pain without sciatica      Subjective Assessment - 04/27/15 0803    Subjective Ready for D/C.  Did yardwork 2 days ago.  80-90% overall improvement since the start of care.   Currently in Pain? Yes   Pain Score 2    Pain Location Buttocks   Pain Orientation Left   Pain Descriptors / Indicators Sore   Aggravating Factors  early morning, yardwork   Pain Relieving Factors as the day  progresses, stretching            [facility] PT Assessment - 04/27/15 0001    Assessment   Medical Diagnosis Osteoarthritis of the spine with lumbar radiculopathy (s/p surgery) M47.26   Onset Date/Surgical Date 12/28/14   Prior Function   Level of Independence Independent   Vocation Retired   Observation/Other Assessments   Focus on Therapeutic Outcomes (FOTO)  37% limitation                     [facility] Adult PT Treatment/Exercise - 04/27/15 0001    Lumbar Exercises: Stretches   Active Hamstring Stretch 2 reps;30 seconds  seated and with strap   Single Knee to Chest Stretch 2 reps;20 seconds  also diagonal knee to chest   Lumbar Exercises: Aerobic   Stationary Bike nustep level 1 8 min  seat#5, arms # 9   Lumbar Exercises: Supine   Bridge 20 reps;5 seconds   Straight Leg Raise 20 reps  right, left abdominal bracing   Lumbar Exercises: Sidelying   Clam 20 reps                PT Education - 04/27/15 3825    Education provided Yes   Education Details bridge, sidelying clam    Person(s) Educated Patient   Methods Explanation;Handout   Comprehension Verbalized understanding;Returned demonstration  PT Long Term Goals - 2015-05-19 0805    PT LONG TERM GOAL #1   Title be independent in advanced HEP   Status Achieved   PT LONG TERM GOAL #2   Title reduce FOTO to < or = to 39% limitation   Status Achieved  37% limitation   PT LONG TERM GOAL #3   Title verbalize understanding of body mechanics modifications to protect spine with ADLs, housework and yardwork   Status Achieved   PT LONG TERM GOAL #4   Title report < or  = to 1/10 LBP with yardwork and houswork activity   Status Achieved               Plan - 05/19/2015 0815    Clinical Impression Statement Pt reports 80-90% overall improvement since the start of care.  Pt has HEP in place for flexibility and core strength and is independent in body mechanics education/modifications.   Pt will be discharged to HEP.   PT Next Visit Plan D/C PT to HEP   Consulted and Agree with Plan of Care Patient          G-Codes - 05-19-2015 0805    Functional Assessment Tool Used FOTO: 37% limitation   Functional Limitation Other PT primary   Other PT Primary Goal Status (J0964) At least 20 percent but less than 40 percent impaired, limited or restricted   Other PT Primary Discharge Status (R8381) At least 20 percent but less than 40 percent impaired, limited or restricted      Problem List Patient Active Problem List   Diagnosis Date Noted  . Lumbar stenosis 12/28/2014  . Hepatitis C antibody test positive 12/07/2014  . Left sided sciatica 11/04/2012  . Hypothyroidism 07/26/2010  . Hyperlipidemia 07/26/2010  . MIGRAINE HEADACHE 07/26/2010  . [place] DISEASE 07/26/2010  . ARTHRITIS 07/26/2010  . ALLERGIC RHINITIS 07/05/2010  . DIVERTICULOSIS, COLON 07/05/2010  . OSTEOPENIA 07/05/2010  PHYSICAL THERAPY DISCHARGE SUMMARY  Visits from Start of Care: 8  Current functional level related to goals / functional outcomes: See above for current status.   Remaining deficits: No significant limitation at this time.  Pt reports mild soreness after gardening.  Pt has HEP in place and will continue with exercises for continued gains.    Education / Equipment: HEP, Economist education Plan: Patient agrees to discharge.  Patient goals were met. Patient is being discharged due to meeting the stated rehab goals.  ?????      TAKACS,KELLY, PT May 19, 2015, 8:39 AM  [facility] [address] [place], [place], [address] Phone: [phone]   Fax:  [phone]  Name: Holly Richard MRN: [id number] Date of Birth: [birth year]

## 2015-05-29 ENCOUNTER — Encounter: Payer: Self-pay | Admitting: Internal Medicine

## 2015-06-05 DIAGNOSIS — M4726 Other spondylosis with radiculopathy, lumbar region: Secondary | ICD-10-CM | POA: Diagnosis not present

## 2015-06-05 DIAGNOSIS — M4310 Spondylolisthesis, site unspecified: Secondary | ICD-10-CM | POA: Insufficient documentation

## 2015-06-05 DIAGNOSIS — M4316 Spondylolisthesis, lumbar region: Secondary | ICD-10-CM | POA: Diagnosis not present

## 2015-06-05 DIAGNOSIS — M5440 Lumbago with sciatica, unspecified side: Secondary | ICD-10-CM | POA: Diagnosis not present

## 2015-06-05 DIAGNOSIS — M5416 Radiculopathy, lumbar region: Secondary | ICD-10-CM | POA: Diagnosis not present

## 2015-06-05 DIAGNOSIS — M5136 Other intervertebral disc degeneration, lumbar region: Secondary | ICD-10-CM | POA: Diagnosis not present

## 2015-06-07 DIAGNOSIS — M4726 Other spondylosis with radiculopathy, lumbar region: Secondary | ICD-10-CM | POA: Diagnosis not present

## 2015-06-09 DIAGNOSIS — M5126 Other intervertebral disc displacement, lumbar region: Secondary | ICD-10-CM | POA: Diagnosis not present

## 2015-06-09 DIAGNOSIS — M4726 Other spondylosis with radiculopathy, lumbar region: Secondary | ICD-10-CM | POA: Diagnosis not present

## 2015-06-19 DIAGNOSIS — M4316 Spondylolisthesis, lumbar region: Secondary | ICD-10-CM | POA: Diagnosis not present

## 2015-06-19 DIAGNOSIS — M5416 Radiculopathy, lumbar region: Secondary | ICD-10-CM | POA: Diagnosis not present

## 2015-06-19 DIAGNOSIS — M5136 Other intervertebral disc degeneration, lumbar region: Secondary | ICD-10-CM | POA: Diagnosis not present

## 2015-06-19 DIAGNOSIS — M4726 Other spondylosis with radiculopathy, lumbar region: Secondary | ICD-10-CM | POA: Diagnosis not present

## 2015-06-19 DIAGNOSIS — M5440 Lumbago with sciatica, unspecified side: Secondary | ICD-10-CM | POA: Diagnosis not present

## 2015-06-19 DIAGNOSIS — M5126 Other intervertebral disc displacement, lumbar region: Secondary | ICD-10-CM | POA: Diagnosis not present

## 2015-07-09 HISTORY — PX: COLONOSCOPY: SHX174

## 2015-07-09 HISTORY — PX: POLYPECTOMY: SHX149

## 2015-07-21 DIAGNOSIS — M47816 Spondylosis without myelopathy or radiculopathy, lumbar region: Secondary | ICD-10-CM | POA: Diagnosis not present

## 2015-07-21 DIAGNOSIS — M47817 Spondylosis without myelopathy or radiculopathy, lumbosacral region: Secondary | ICD-10-CM | POA: Insufficient documentation

## 2015-07-21 DIAGNOSIS — G8929 Other chronic pain: Secondary | ICD-10-CM | POA: Insufficient documentation

## 2015-07-21 DIAGNOSIS — M4316 Spondylolisthesis, lumbar region: Secondary | ICD-10-CM | POA: Diagnosis not present

## 2015-07-21 DIAGNOSIS — M5136 Other intervertebral disc degeneration, lumbar region: Secondary | ICD-10-CM | POA: Diagnosis not present

## 2015-07-21 DIAGNOSIS — M545 Low back pain: Secondary | ICD-10-CM | POA: Diagnosis not present

## 2015-07-21 DIAGNOSIS — M5126 Other intervertebral disc displacement, lumbar region: Secondary | ICD-10-CM | POA: Diagnosis not present

## 2015-07-27 DIAGNOSIS — H04123 Dry eye syndrome of bilateral lacrimal glands: Secondary | ICD-10-CM | POA: Diagnosis not present

## 2015-07-27 DIAGNOSIS — H16223 Keratoconjunctivitis sicca, not specified as Sjogren's, bilateral: Secondary | ICD-10-CM | POA: Diagnosis not present

## 2015-09-04 DIAGNOSIS — H9041 Sensorineural hearing loss, unilateral, right ear, with unrestricted hearing on the contralateral side: Secondary | ICD-10-CM | POA: Diagnosis not present

## 2015-09-04 DIAGNOSIS — H8101 Meniere's disease, right ear: Secondary | ICD-10-CM | POA: Diagnosis not present

## 2015-09-22 ENCOUNTER — Encounter: Payer: Self-pay | Admitting: Internal Medicine

## 2015-09-25 DIAGNOSIS — H16223 Keratoconjunctivitis sicca, not specified as Sjogren's, bilateral: Secondary | ICD-10-CM | POA: Diagnosis not present

## 2015-09-25 DIAGNOSIS — H04123 Dry eye syndrome of bilateral lacrimal glands: Secondary | ICD-10-CM | POA: Diagnosis not present

## 2015-10-13 DIAGNOSIS — M5136 Other intervertebral disc degeneration, lumbar region: Secondary | ICD-10-CM | POA: Diagnosis not present

## 2015-10-13 DIAGNOSIS — M4316 Spondylolisthesis, lumbar region: Secondary | ICD-10-CM | POA: Diagnosis not present

## 2015-10-13 DIAGNOSIS — M545 Low back pain: Secondary | ICD-10-CM | POA: Diagnosis not present

## 2015-10-13 DIAGNOSIS — M5126 Other intervertebral disc displacement, lumbar region: Secondary | ICD-10-CM | POA: Diagnosis not present

## 2015-10-13 DIAGNOSIS — M47816 Spondylosis without myelopathy or radiculopathy, lumbar region: Secondary | ICD-10-CM | POA: Diagnosis not present

## 2015-11-06 DIAGNOSIS — Z1231 Encounter for screening mammogram for malignant neoplasm of breast: Secondary | ICD-10-CM | POA: Diagnosis not present

## 2015-11-06 DIAGNOSIS — Z01419 Encounter for gynecological examination (general) (routine) without abnormal findings: Secondary | ICD-10-CM | POA: Diagnosis not present

## 2015-11-06 DIAGNOSIS — Z124 Encounter for screening for malignant neoplasm of cervix: Secondary | ICD-10-CM | POA: Diagnosis not present

## 2015-11-06 LAB — HM MAMMOGRAPHY

## 2015-11-22 ENCOUNTER — Encounter: Payer: Medicare Other | Admitting: Internal Medicine

## 2015-12-19 ENCOUNTER — Ambulatory Visit (AMBULATORY_SURGERY_CENTER): Payer: Self-pay | Admitting: *Deleted

## 2015-12-19 VITALS — Ht 62.0 in | Wt 121.0 lb

## 2015-12-19 DIAGNOSIS — Z800 Family history of malignant neoplasm of digestive organs: Secondary | ICD-10-CM

## 2015-12-19 MED ORDER — NA SULFATE-K SULFATE-MG SULF 17.5-3.13-1.6 GM/177ML PO SOLN: 1.0000 | Freq: Once | ORAL | Status: DC

## 2015-12-19 NOTE — Progress Notes (Signed)
No egg or soy allergy known to patient  No issues with past sedation with any surgeries  or procedures, no intubation problems  No diet pills per patient No home 02 use per patient  No blood thinners per patient  Pt denies issues with constipation - occ constipation but not chronic emmi video declined

## 2015-12-25 ENCOUNTER — Telehealth: Payer: Self-pay | Admitting: Internal Medicine

## 2015-12-25 NOTE — Telephone Encounter (Signed)
Told patient I would leave a suprep sample up front for her to pick up.  Patient agreed.

## 2016-01-02 ENCOUNTER — Ambulatory Visit (AMBULATORY_SURGERY_CENTER): Payer: Medicare Other | Admitting: Internal Medicine

## 2016-01-02 ENCOUNTER — Encounter: Payer: Self-pay | Admitting: Internal Medicine

## 2016-01-02 VITALS — BP 110/64 | HR 69 | Temp 97.8°F | Resp 17 | Ht 62.0 in | Wt 121.0 lb

## 2016-01-02 DIAGNOSIS — D120 Benign neoplasm of cecum: Secondary | ICD-10-CM

## 2016-01-02 DIAGNOSIS — Z800 Family history of malignant neoplasm of digestive organs: Secondary | ICD-10-CM

## 2016-01-02 DIAGNOSIS — Z1211 Encounter for screening for malignant neoplasm of colon: Secondary | ICD-10-CM | POA: Diagnosis not present

## 2016-01-02 DIAGNOSIS — E039 Hypothyroidism, unspecified: Secondary | ICD-10-CM | POA: Diagnosis not present

## 2016-01-02 MED ORDER — SODIUM CHLORIDE 0.9 % IV SOLN: 500.0000 mL | INTRAVENOUS | Status: DC

## 2016-01-02 NOTE — Progress Notes (Signed)
Called to room to assist during endoscopic procedure.  Patient ID and intended procedure confirmed with present staff. Received instructions for my participation in the procedure from the performing physician.  

## 2016-01-02 NOTE — Progress Notes (Signed)
To recovery, report to McCoy, RN, VSS 

## 2016-01-02 NOTE — Patient Instructions (Signed)
Handouts on polyps and diverticulosis. Discharge instructions given. Resume previous medications. YOU HAD AN ENDOSCOPIC PROCEDURE TODAY AT [facility]:   Refer to the procedure report that was given to you for any specific questions about what was found during the examination.  If the procedure report does not answer your questions, please call your gastroenterologist to clarify.  If you requested that your care partner not be given the details of your procedure findings, then the procedure report has been included in a sealed envelope for you to review at your convenience later.  YOU SHOULD EXPECT: Some feelings of bloating in the abdomen. Passage of more gas than usual.  Walking can help get rid of the air that was put into your GI tract during the procedure and reduce the bloating. If you had a lower endoscopy (such as a colonoscopy or flexible sigmoidoscopy) you may notice spotting of blood in your stool or on the toilet paper. If you underwent a bowel prep for your procedure, you may not have a normal bowel movement for a few days.  Please Note:  You might notice some irritation and congestion in your nose or some drainage.  This is from the oxygen used during your procedure.  There is no need for concern and it should clear up in a day or so.  SYMPTOMS TO REPORT IMMEDIATELY:   Following lower endoscopy (colonoscopy or flexible sigmoidoscopy):  Excessive amounts of blood in the stool  Significant tenderness or worsening of abdominal pains  Swelling of the abdomen that is new, acute  Fever of 100F or higher   For urgent or emergent issues, a gastroenterologist can be reached at any hour by calling [phone].   DIET: Your first meal following the procedure should be a small meal and then it is ok to progress to your normal diet. Heavy or fried foods are harder to digest and may make you feel nauseous or bloated.  Likewise, meals heavy in dairy and vegetables can  increase bloating.  Drink plenty of fluids but you should avoid alcoholic beverages for 24 hours.  ACTIVITY:  You should plan to take it easy for the rest of today and you should NOT DRIVE or use heavy machinery until tomorrow (because of the sedation medicines used during the test).    FOLLOW UP: Our staff will call the number listed on your records the next business day following your procedure to check on you and address any questions or concerns that you may have regarding the information given to you following your procedure. If we do not reach you, we will leave a message.  However, if you are feeling well and you are not experiencing any problems, there is no need to return our call.  We will assume that you have returned to your regular daily activities without incident.  If any biopsies were taken you will be contacted by phone or by letter within the next 1-3 weeks.  Please call us at [phone] if you have not heard about the biopsies in 3 weeks.    SIGNATURES/CONFIDENTIALITY: You and/or your care partner have signed paperwork which will be entered into your electronic medical record.  These signatures attest to the fact that that the information above on your After Visit Summary has been reviewed and is understood.  Full responsibility of the confidentiality of this discharge information lies with you and/or your care-partner.

## 2016-01-02 NOTE — Op Note (Signed)
[facility] Patient Name: Holly Richard Procedure Date: 01/02/2016 9:15 AM MRN: [id number] Endoscopist: Docia Chuck. Henrene Pastor , MD Age: 75 Referring MD:  Date of Birth: [birth year] Gender: Female Account #: [id number] Procedure:                Colonoscopy, with snare cold polypectomy x 1 Indications:              Screening patient at increased risk: Family history                            of 1st-degree relative with colorectal cancer at                            age 44 years (or older). Prior examinations 2007                            and 2012 without neoplasia Medicines:                Monitored Anesthesia Care Procedure:                Pre-Anesthesia Assessment:                           - Prior to the procedure, a History and Physical                            was performed, and patient medications and                            allergies were reviewed. The patient's tolerance of                            previous anesthesia was also reviewed. The risks                            and benefits of the procedure and the sedation                            options and risks were discussed with the patient.                            All questions were answered, and informed consent                            was obtained. Prior Anticoagulants: The patient has                            taken no previous anticoagulant or antiplatelet                            agents. ASA Grade Assessment: II - A patient with                            mild systemic disease. After reviewing the risks  and benefits, the patient was deemed in                            satisfactory condition to undergo the procedure.                           After obtaining informed consent, the colonoscope                            was passed under direct vision. Throughout the                            procedure, the patient's blood pressure, pulse, and   oxygen saturations were monitored continuously. The                            Model PCF-H190L [id number]) scope was introduced                            through the anus and advanced to the the cecum,                            identified by appendiceal orifice and ileocecal                            valve. The ileocecal valve, appendiceal orifice,                            and rectum were photographed. The quality of the                            bowel preparation was excellent. The colonoscopy                            was performed without difficulty. The patient                            tolerated the procedure well. The bowel preparation                            used was SUPREP. Scope In: 9:24:36 AM Scope Out: 9:38:09 AM Scope Withdrawal Time: 0 hours 8 minutes 49 seconds  Total Procedure Duration: 0 hours 13 minutes 33 seconds  Findings:                 A 1 mm polyp was found in the cecum. The polyp was                            removed with a cold snare. Resection and retrieval                            were complete.                           A few diverticula were found  in the sigmoid colon,                            with slightly fixed rectosigmoid stenosis.                            Melanosis coli (mild) present.                           The exam was otherwise without abnormality on                            direct and retroflexion views. Complications:            No immediate complications. Estimated blood loss:                            None. Estimated Blood Loss:     Estimated blood loss: none. Impression:               - One 1 mm polyp in the cecum, removed with a cold                            snare. Resected and retrieved.                           - Diverticulosis in the sigmoid colon, with                            stenosis. Mild melanosis coli.                           - The examination was otherwise normal on direct                            and  retroflexion views. Recommendation:           - Repeat colonoscopy in 5 years for surveillance if                            polyp adenomatous. Otherwise follow-up prn.                           - Patient has a contact number available for                            emergencies. The signs and symptoms of potential                            delayed complications were discussed with the                            patient. Return to normal activities tomorrow.                            Written discharge instructions were provided to the  patient.                           - Resume previous diet.                           - Continue present medications.                           - Await pathology results. Docia Chuck. Henrene Pastor, MD 01/02/2016 9:43:37 AM This report has been signed electronically. CC Letter to:             Binnie Rail, MD

## 2016-01-03 ENCOUNTER — Telehealth: Payer: Self-pay

## 2016-01-03 NOTE — Telephone Encounter (Signed)
  Follow up Call-  Call back number 01/02/2016  Post procedure Call Back phone  # [phone]  Permission to leave phone message Yes     Patient questions:  Do you have a fever, pain , or abdominal swelling? No. Pain Score  0 *  Have you tolerated food without any problems? Yes.    Have you been able to return to your normal activities? Yes.    Do you have any questions about your discharge instructions: Diet   No. Medications  No. Follow up visit  No.  Do you have questions or concerns about your Care? No.  Actions: * If pain score is 4 or above: No action needed, pain <4.

## 2016-01-05 ENCOUNTER — Encounter: Payer: Self-pay | Admitting: Internal Medicine

## 2016-01-11 ENCOUNTER — Telehealth: Payer: Self-pay | Admitting: Internal Medicine

## 2016-01-11 NOTE — Telephone Encounter (Signed)
Spoke with pt and reviewed path letter with her.

## 2016-02-07 IMAGING — DX DG LUMBAR SPINE 2-3V
2 series · 4 of 4 positions shown · non-contrast
Comparison: 08/10/2014, 09/06/2014

CLINICAL DATA: Left L3-4 laminotomy and L4-5 foraminotomy and
decompression

EXAM:
LUMBAR SPINE - 2-3 VIEW

[Series 1: lat · 0.17mm/px · 2 of 2 slices shown (1 of 2)]
[im 1/2]
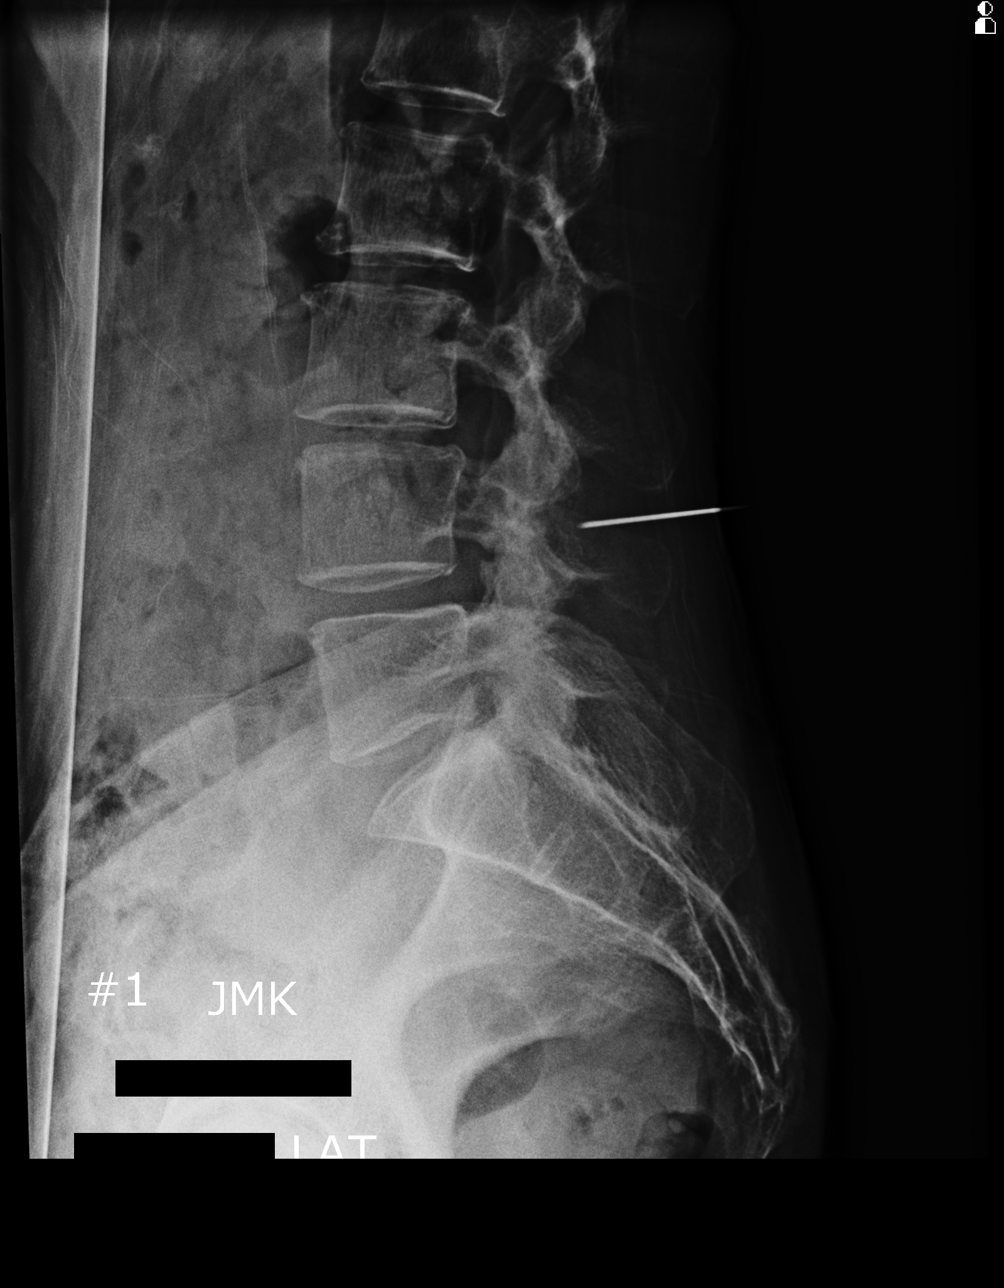
[im 2/2]
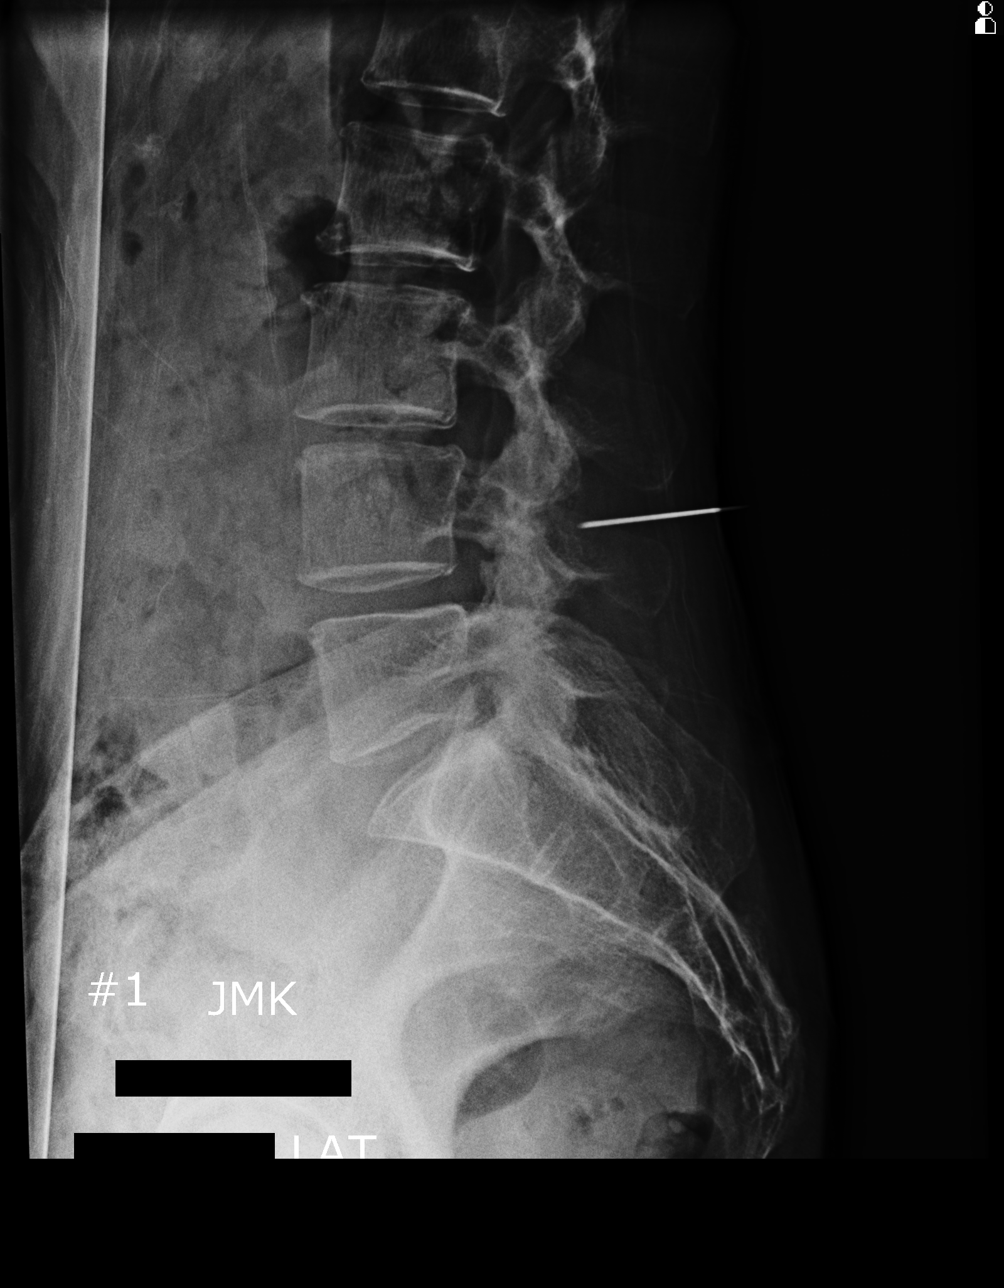

[Series 2: lat · 0.17mm/px · 2 of 2 slices shown (2 of 2)]
[im 1/2]
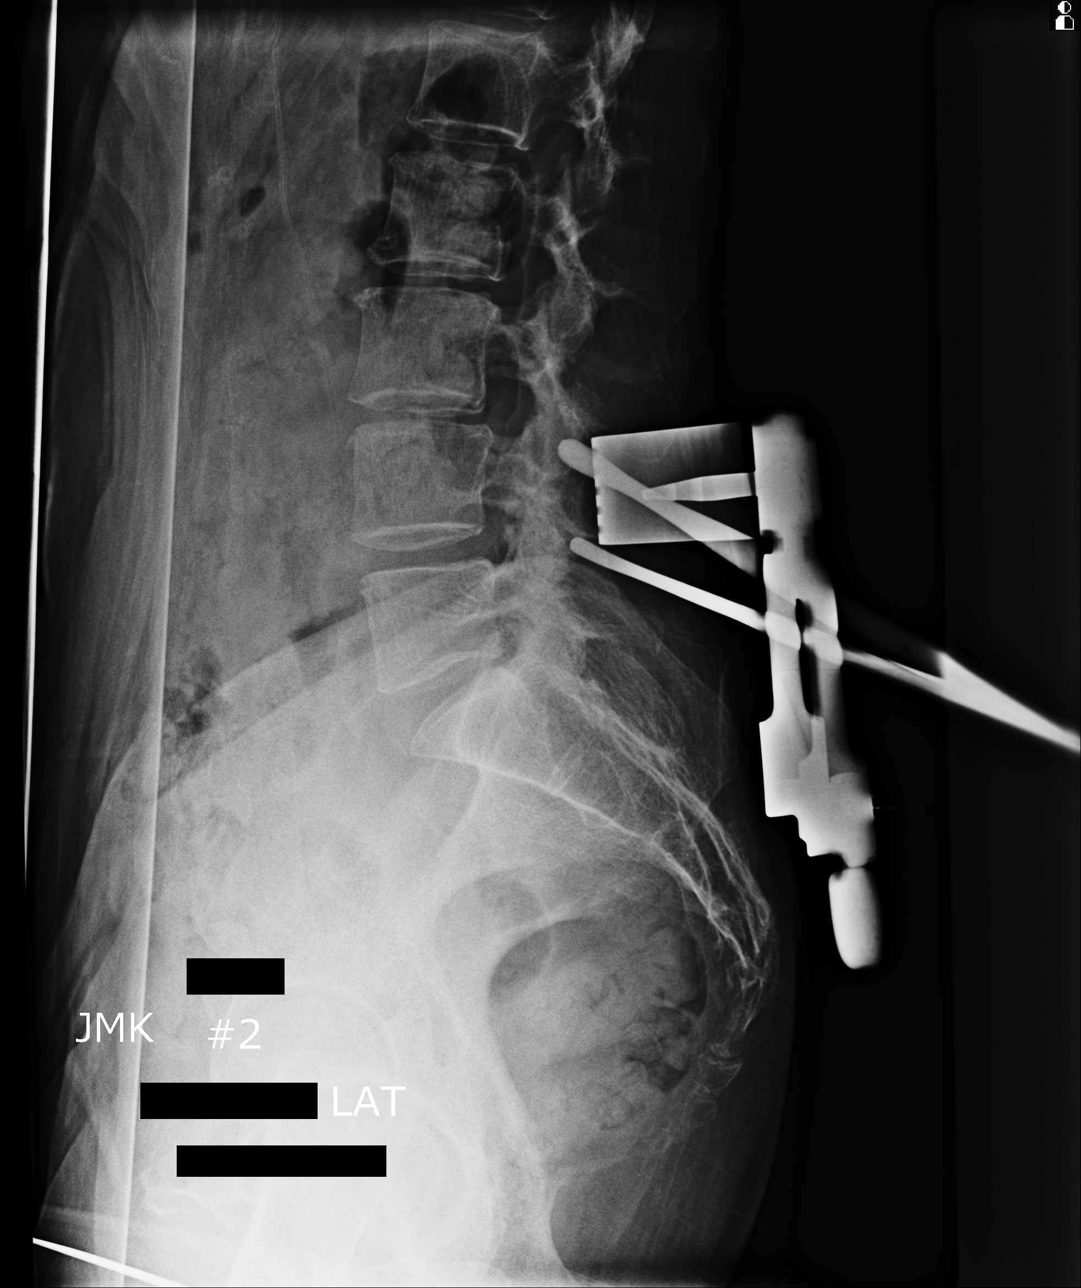
[im 2/2]
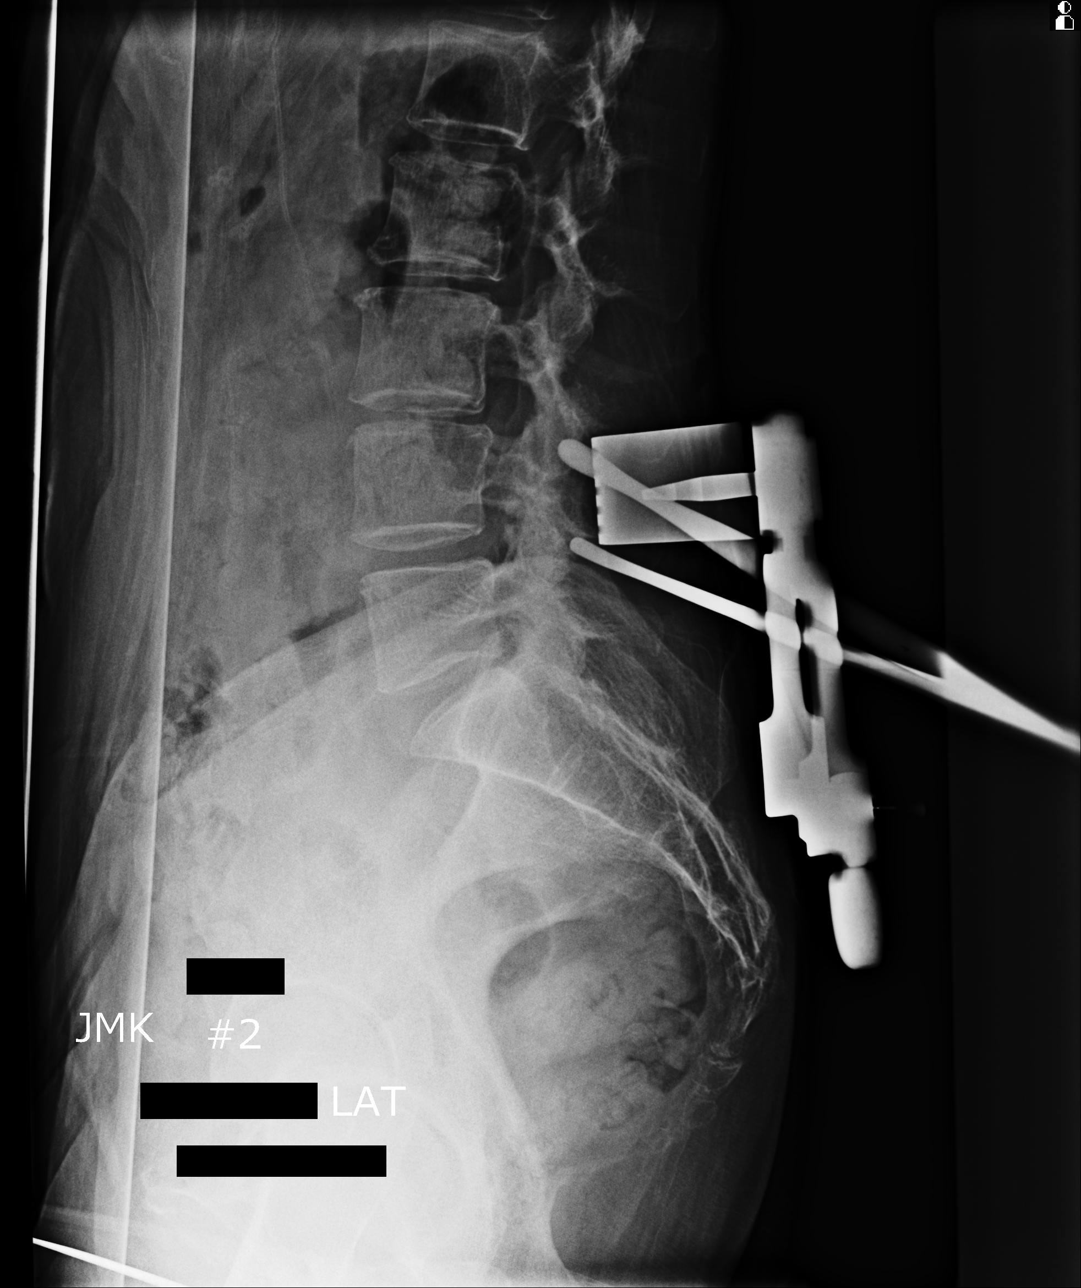

[4 of 4 positions shown; findings below may reference images not displayed]

FINDINGS: The initial image shows a needle within the posterior soft tissues
at L4-5. Subsequent image shows surgical instruments at the L4-5
level as well as the L3-4 level.
IMPRESSION: Intraoperative localization at L3-4 and L4-5.

## 2016-02-28 ENCOUNTER — Encounter: Payer: Medicare Other | Admitting: Internal Medicine

## 2016-02-28 ENCOUNTER — Encounter: Payer: Self-pay | Admitting: Internal Medicine

## 2016-02-28 ENCOUNTER — Other Ambulatory Visit (INDEPENDENT_AMBULATORY_CARE_PROVIDER_SITE_OTHER): Payer: Medicare Other

## 2016-02-28 ENCOUNTER — Ambulatory Visit (INDEPENDENT_AMBULATORY_CARE_PROVIDER_SITE_OTHER): Payer: Medicare Other | Admitting: Internal Medicine

## 2016-02-28 ENCOUNTER — Other Ambulatory Visit: Payer: Self-pay | Admitting: Internal Medicine

## 2016-02-28 VITALS — BP 122/86 | HR 72 | Temp 98.3°F | Resp 16 | Ht 62.0 in | Wt 120.0 lb

## 2016-02-28 DIAGNOSIS — M8580 Other specified disorders of bone density and structure, unspecified site: Secondary | ICD-10-CM

## 2016-02-28 DIAGNOSIS — R894 Abnormal immunological findings in specimens from other organs, systems and tissues: Secondary | ICD-10-CM | POA: Diagnosis not present

## 2016-02-28 DIAGNOSIS — E785 Hyperlipidemia, unspecified: Secondary | ICD-10-CM | POA: Diagnosis not present

## 2016-02-28 DIAGNOSIS — R7689 Other specified abnormal immunological findings in serum: Secondary | ICD-10-CM

## 2016-02-28 DIAGNOSIS — Z23 Encounter for immunization: Secondary | ICD-10-CM | POA: Diagnosis not present

## 2016-02-28 DIAGNOSIS — R768 Other specified abnormal immunological findings in serum: Secondary | ICD-10-CM

## 2016-02-28 DIAGNOSIS — E039 Hypothyroidism, unspecified: Secondary | ICD-10-CM | POA: Diagnosis not present

## 2016-02-28 DIAGNOSIS — H8109 Meniere's disease, unspecified ear: Secondary | ICD-10-CM

## 2016-02-28 DIAGNOSIS — Z0000 Encounter for general adult medical examination without abnormal findings: Secondary | ICD-10-CM | POA: Diagnosis not present

## 2016-02-28 LAB — CBC WITH DIFFERENTIAL/PLATELET
BASOS ABS: 0 10*3/uL (ref 0.0–0.1)
Basophils Relative: 0.4 % (ref 0.0–3.0)
EOS PCT: 0.6 % (ref 0.0–5.0)
Eosinophils Absolute: 0 10*3/uL (ref 0.0–0.7)
HEMATOCRIT: 40.7 % (ref 36.0–46.0)
Hemoglobin: 14.1 g/dL (ref 12.0–15.0)
LYMPHS PCT: 29.1 % (ref 12.0–46.0)
Lymphs Abs: 1.6 10*3/uL (ref 0.7–4.0)
MCHC: 34.5 g/dL (ref 30.0–36.0)
MCV: 91.7 fl (ref 78.0–100.0)
MONOS PCT: 6.5 % (ref 3.0–12.0)
Monocytes Absolute: 0.4 10*3/uL (ref 0.1–1.0)
NEUTROS ABS: 3.5 10*3/uL (ref 1.4–7.7)
Neutrophils Relative %: 63.4 % (ref 43.0–77.0)
PLATELETS: 229 10*3/uL (ref 150.0–400.0)
RBC: 4.43 Mil/uL (ref 3.87–5.11)
RDW: 13.4 % (ref 11.5–15.5)
WBC: 5.5 10*3/uL (ref 4.0–10.5)

## 2016-02-28 LAB — COMPREHENSIVE METABOLIC PANEL
ALK PHOS: 45 U/L (ref 39–117)
ALT: 15 U/L (ref 0–35)
AST: 16 U/L (ref 0–37)
Albumin: 4.3 g/dL (ref 3.5–5.2)
BILIRUBIN TOTAL: 0.7 mg/dL (ref 0.2–1.2)
BUN: 24 mg/dL — ABNORMAL HIGH (ref 6–23)
CALCIUM: 9.3 mg/dL (ref 8.4–10.5)
CO2: 30 mEq/L (ref 19–32)
Chloride: 102 mEq/L (ref 96–112)
Creatinine, Ser: 0.98 mg/dL (ref 0.40–1.20)
GFR: 59.15 mL/min — AB (ref >60.00)
Glucose, Bld: 93 mg/dL (ref 70–99)
Potassium: 3.4 mEq/L — ABNORMAL LOW (ref 3.5–5.1)
Sodium: 140 mEq/L (ref 135–145)
TOTAL PROTEIN: 7 g/dL (ref 6.0–8.3)

## 2016-02-28 LAB — LIPID PANEL
Cholesterol: 200 mg/dL (ref 0–200)
HDL: 68.4 mg/dL (ref >39.00)
LDL Cholesterol: 116 mg/dL — ABNORMAL HIGH (ref 0–99)
NonHDL: 131.3
TRIGLYCERIDES: 76 mg/dL (ref 0.0–149.0)
Total CHOL/HDL Ratio: 3
VLDL: 15.2 mg/dL (ref 0.0–40.0)

## 2016-02-28 LAB — TSH: TSH: 1.29 u[IU]/mL (ref 0.35–4.50)

## 2016-02-28 NOTE — Progress Notes (Signed)
Pre visit review using our clinic review tool, if applicable. No additional management support is needed unless otherwise documented below in the visit note. 

## 2016-02-28 NOTE — Assessment & Plan Note (Signed)
Recheck hepatitis C Ab and RNA

## 2016-02-28 NOTE — Assessment & Plan Note (Addendum)
Continue maxzide daily Following with ENT

## 2016-02-28 NOTE — Assessment & Plan Note (Signed)
Never on medication Check lipids, cmp

## 2016-02-28 NOTE — Assessment & Plan Note (Signed)
Check tsh  Titrate med dose if needed  

## 2016-02-28 NOTE — Assessment & Plan Note (Signed)
dexa up to date Stressed regular exercise Continue calcium and vitamin

## 2016-02-28 NOTE — Progress Notes (Signed)
Subjective:    Patient ID: Holly Richard, female    DOB: 14-Nov-1940, 75 y.o.   MRN: [id number]  HPI She is here to establish with a new pcp.    Here for medicare wellness exam and for follow up of her chronic medical conditions.   Hypothyroidism:  She is taking her medication daily.  She denies any recent changes in energy or weight that are unexplained.   Meniere's disease:  She had a cochlear implant several years ago.  She is currently taking maxzide daily. She follows with ENT regularly and has an [facility] appointment.  I have personally reviewed and have noted 1.The patient's medical and social history 2.Their use of alcohol, tobacco or illicit drugs 3.Their current medications and supplements 4.The patient's functional ability including ADL's, fall risks, home safety risks and hearing or visual impairment. 5.Diet and physical activities 6.Evidence for depression or mood disorders 7.Care team reviewed and updated: ENT - Dr  Benjamine Mola,  Neurosurgery - Dr Sherwood Gambler, GI- Dr Henrene Pastor, Derm - Dr Rozann Lesches   Are there smokers in your home (other than you)? No  Risk Factors Exercise: walks 3-4 times a week, walks a mile Dietary issues discussed: well balanced, too many sweets  Cardiac risk factors: advanced age    Depression Screen  Have you felt down, depressed or hopeless? No  Have you felt little interest or pleasure in doing things?  No  Activities of Daily Living In your present state of health, do you have any difficulty performing the following activities?:  Driving? No Managing money?  No Feeding yourself? No Getting from bed to chair? No Climbing a flight of stairs? No Preparing food and eating?: No Bathing or showering? No Getting dressed: No Getting to/using the toilet? No Moving around from place to place: No In the past year have you fallen or had a near fall?: No   Are you sexually active?   yes  Do you have more than one partner?  No  Hearing Difficulties: yes Do you often ask people to speak up or repeat themselves? yes Do you experience ringing or noises in your ears? No Do you have difficulty understanding soft or whispered voices? yes Vision:              Any change in vision: no              Up to date with eye exam: yes  Memory:  Do you feel that you have a problem with memory? No  Do you often misplace items? No  Do you feel safe at home?  Yes  Cognitive Testing  Alert, Orientated? Yes  Normal Appearance? Yes  Recall of three objects?  Yes  Can perform simple calculations? Yes  Displays appropriate judgment? Yes  Can read the correct time from a watch face? Yes   Advanced Directives have been discussed with the patient? Yes   Medications and allergies reviewed with patient and updated if appropriate.  Patient Active Problem List   Diagnosis Date Noted  . Lumbar stenosis 12/28/2014  . Hepatitis C antibody test positive 12/07/2014  . Left sided sciatica 11/04/2012  . Hypothyroidism 07/26/2010  . Hyperlipidemia 07/26/2010  . MIGRAINE HEADACHE 07/26/2010  . Meniere's disease 07/26/2010  . ARTHRITIS 07/26/2010  . ALLERGIC RHINITIS 07/05/2010  . DIVERTICULOSIS, COLON 07/05/2010  . Osteopenia 07/05/2010    Current Outpatient Prescriptions on File Prior to Visit  Medication Sig Dispense Refill  . acetaminophen (TYLENOL) 500 MG tablet Take 500  mg by mouth every 6 (six) hours as needed for mild pain or moderate pain.    Marland Kitchen aspirin 81 MG tablet Take 81 mg by mouth daily.      Marland Kitchen CALCIUM-VITAMIN D PO Take 1 tablet by mouth daily.     . fluticasone (FLONASE) 50 MCG/ACT nasal spray Place 1 spray into both nostrils daily as needed for allergies or rhinitis.    Marland Kitchen ibuprofen (ADVIL,MOTRIN) 200 MG tablet Take 200 mg by mouth every 6 (six) hours as needed for headache, mild pain or moderate pain.    Marland Kitchen levothyroxine (SYNTHROID, LEVOTHROID) 50 MCG tablet Take 1 tablet  (50 mcg total) by mouth daily before breakfast. 90 tablet 4  . Lifitegrast (XIIDRA) 5 % SOLN Apply 1 drop to eye 2 (two) times daily before a meal.    . Multiple Vitamins-Minerals (QC WOMENS DAILY MULTIVITAMIN) TABS Take 1 tablet by mouth daily.     . Omega-3 Fatty Acids (FISH OIL TRIPLE STRENGTH) 1400 MG CAPS Take 1 tablet by mouth daily.     Vladimir Faster Glycol-Propyl Glycol (SYSTANE) 0.4-0.3 % SOLN Apply to eye as needed.    . triamterene-hydrochlorothiazide (MAXZIDE-25) 37.5-25 MG per tablet Take 1 tablet by mouth daily.       No current facility-administered medications on file prior to visit.     Past Medical History:  Diagnosis Date  . ALLERGIC RHINITIS   . Allergy   . ARTHRITIS   . Arthritis   . DIVERTICULOSIS, COLON   . Hepatitis C antibody test positive 11/2014   negative RNA confirmation test  . HYPERLIPIDEMIA   . HYPOTHYROIDISM   . MENIERE'S DISEASE   . MIGRAINE HEADACHE   . OSTEOPENIA     Past Surgical History:  Procedure Laterality Date  . (R) Ear implants  10/2008   Hearing loss-coclear implant   . BUNIONECTOMY     X's - (R) 2/04 & (L) 3/04  . COLONOSCOPY    . LUMBAR LAMINECTOMY/DECOMPRESSION MICRODISCECTOMY Left 12/28/2014   Procedure: LUMBAR LAMINECTOMY/DECOMPRESSION MICRODISCECTOMY 2 LEVELS;  Surgeon: Jovita Gamma, MD;  Location: [facility];  Service: Neurosurgery;  Laterality: Left;  Left L34 laminotomy, foraminotomy and poss microdiskectomy with left L45 laminotomy, foraminotomy, resection of synovial cyst and poss microdiskectomy  . TONSILLECTOMY  1954    Social History   Social History  . Marital status: Married    Spouse name: N/A  . Number of children: N/A  . Years of education: N/A   Social History Main Topics  . Smoking status: Never Smoker  . Smokeless tobacco: Never Used  . Alcohol use No  . Drug use: Unknown  . Sexual activity: Not Asked   Other Topics Concern  . None   Social History Narrative   Married, lives with spouse-2 sons  and 4 g-kids nearby.    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 Arthritis Mother   . Diabetes Mother   . Heart disease Mother   . Colon cancer Father 44  . Stroke Father   . Breast cancer Sister   . Diabetes Sister   . Lung cancer Sister   . Diabetes Brother   . Prostate cancer Brother   . Brain cancer Other     Nephew  . Colon polyps Neg Hx   . Rectal cancer Neg Hx   . Stomach cancer Neg Hx     Review of Systems  Constitutional: Negative for appetite  change, chills, fatigue, fever and unexpected weight change.  HENT: Positive for hearing loss.   Eyes: Negative for visual disturbance.  Respiratory: Negative for cough, shortness of breath and wheezing.   Cardiovascular: Negative for chest pain, palpitations and leg swelling.  Gastrointestinal: Positive for constipation. Negative for abdominal pain, blood in stool, diarrhea and nausea.       No gerd  Genitourinary: Negative for dysuria and hematuria.  Musculoskeletal: Positive for arthralgias (thumbs) and back pain (chronic lower back soreness, arthritis in back).  Skin: Negative for color change and rash.  Neurological: Negative for dizziness, light-headedness and headaches.  Psychiatric/Behavioral: Negative for dysphoric mood. The patient is not nervous/anxious.        Objective:   Vitals:   02/28/16 0820  BP: 122/86  Pulse: 72  Resp: 16  Temp: 98.3 F (36.8 C)   Filed Weights   02/28/16 0820  Weight: 120 lb (54.4 kg)   Body mass index is 21.95 kg/m.   Physical Exam Constitutional: She appears well-developed and well-nourished. No distress.  HENT:  Head: Normocephalic and atraumatic.  Right Ear: External ear normal.   Left Ear: External ear normal.    Mouth/Throat: Oropharynx is clear and moist.  Eyes: Conjunctivae and EOM are normal.  Neck: Neck supple. No tracheal deviation present. No thyromegaly present.  No carotid bruit  Cardiovascular:  Normal rate, regular rhythm and normal heart sounds.   No murmur heard.  No edema. Pulmonary/Chest: Effort normal and breath sounds normal. No respiratory distress. She has no wheezes. She has no rales.  Breast: deferred to Gyn Abdominal: Soft. She exhibits no distension. There is no tenderness.  Lymphadenopathy: She has no cervical adenopathy.  Skin: Skin is warm and dry. She is not diaphoretic.  Psychiatric: She has a normal mood and affect. Her behavior is normal.         Assessment & Plan:   Wellness Exam: Immunizations  - flu shot recommended, other immunizations up to date Colonoscopy  Up to date  Mammogram  Up to date  Dexa done 2015 - recheck next year Gyn  Up to date  Eye exam  Up to date  Hearing loss - has hearing loss - follows with ENT, has hearing aid, cochlear implant Memory concerns/difficulties - within normal limits with difficulty with recall  Independent of ADLs - fully independent  Stressed the importance of regular exercise   Patient received copy of preventative screening tests/immunizations recommended for the next 5-10 years.  See Problem List for Assessment and Plan of chronic medical problems.

## 2016-02-28 NOTE — Patient Instructions (Addendum)
Holly Richard , Thank you for taking time to come for your Medicare Wellness Visit. I appreciate your ongoing commitment to your health goals. Please review the following plan we discussed and let me know if I can assist you in the future.   These are the goals we discussed: Goals    None      This is a list of the screening recommended for you and due dates:  Health Maintenance  Topic Date Due  . Mammogram  Up to date  . Flu Shot  02/06/2016  . DEXA scan (bone density measurement)  11/08/2016  . Colon Cancer Screening  01/01/2021  . Tetanus Vaccine  11/09/2023  . Shingles Vaccine  Completed  . Pneumonia vaccines  Completed    Test(s) ordered today. Your results will be released to [facility] (or called to you) after review, usually within 72hours after test completion. If any changes need to be made, you will be notified at that same time.  All other Health Maintenance issues reviewed.   All recommended immunizations and age-appropriate screenings are up-to-date or discussed.  Hepatitis A/B vaccine administered today.   Medications reviewed and updated.  No changes recommended at this time.  Your prescription(s) have been submitted to your pharmacy. Please take as directed and contact our office if you believe you are having problem(s) with the medication(s).  Please followup in one year for a wellness visit  Health Maintenance, Female Adopting a healthy lifestyle and getting preventive care can go a long way to promote health and wellness. Talk with your health care provider about what schedule of regular examinations is right for you. This is a good chance for you to check in with your provider about disease prevention and staying healthy. In between checkups, there are plenty of things you can do on your own. Experts have done a lot of research about which lifestyle changes and preventive measures are most likely to keep you healthy. Ask your health care provider for more  information. WEIGHT AND DIET  Eat a healthy diet  Be sure to include plenty of vegetables, fruits, low-fat dairy products, and lean protein.  Do not eat a lot of foods high in solid fats, added sugars, or salt.  Get regular exercise. This is one of the most important things you can do for your health.  Most adults should exercise for at least 150 minutes each week. The exercise should increase your heart rate and make you sweat (moderate-intensity exercise).  Most adults should also do strengthening exercises at least twice a week. This is in addition to the moderate-intensity exercise.  Maintain a healthy weight  Body mass index (BMI) is a measurement that can be used to identify possible weight problems. It estimates body fat based on height and weight. Your health care provider can help determine your BMI and help you achieve or maintain a healthy weight.  For females 57 years of age and older:   A BMI below 18.5 is considered underweight.  A BMI of 18.5 to 24.9 is normal.  A BMI of 25 to 29.9 is considered overweight.  A BMI of 30 and above is considered obese.  Watch levels of cholesterol and blood lipids  You should start having your blood tested for lipids and cholesterol at 73 years of age, then have this test every 5 years.  You may need to have your cholesterol levels checked more often if:  Your lipid or cholesterol levels are high.  You are older  than 73 years of age.  You are at high risk for heart disease.  CANCER SCREENING   Lung Cancer  Lung cancer screening is recommended for adults 54-41 years old who are at high risk for lung cancer because of a history of smoking.  A yearly low-dose CT scan of the lungs is recommended for people who:  Currently smoke.  Have quit within the past 15 years.  Have at least a 30-pack-year history of smoking. A pack year is smoking an average of one pack of cigarettes a day for 1 year.  Yearly screening should  continue until it has been 15 years since you quit.  Yearly screening should stop if you develop a health problem that would prevent you from having lung cancer treatment.  Breast Cancer  Practice breast self-awareness. This means understanding how your breasts normally appear and feel.  It also means doing regular breast self-exams. Let your health care provider know about any changes, no matter how small.  If you are in your 20s or 30s, you should have a clinical breast exam (CBE) by a health care provider every 1-3 years as part of a regular health exam.  If you are 16 or older, have a CBE every year. Also consider having a breast X-ray (mammogram) every year.  If you have a family history of breast cancer, talk to your health care provider about genetic screening.  If you are at high risk for breast cancer, talk to your health care provider about having an MRI and a mammogram every year.  Breast cancer gene (BRCA) assessment is recommended for women who have family members with BRCA-related cancers. BRCA-related cancers include:  Breast.  Ovarian.  Tubal.  Peritoneal cancers.  Results of the assessment will determine the need for genetic counseling and BRCA1 and BRCA2 testing. Cervical Cancer Your health care provider may recommend that you be screened regularly for cancer of the pelvic organs (ovaries, uterus, and vagina). This screening involves a pelvic examination, including checking for microscopic changes to the surface of your cervix (Pap test). You may be encouraged to have this screening done every 3 years, beginning at age 55.  For women ages 70-65, health care providers may recommend pelvic exams and Pap testing every 3 years, or they may recommend the Pap and pelvic exam, combined with testing for human papilloma virus (HPV), every 5 years. Some types of HPV increase your risk of cervical cancer. Testing for HPV may also be done on women of any age with unclear Pap  test results.  Other health care providers may not recommend any screening for nonpregnant women who are considered low risk for pelvic cancer and who do not have symptoms. Ask your health care provider if a screening pelvic exam is right for you.  If you have had past treatment for cervical cancer or a condition that could lead to cancer, you need Pap tests and screening for cancer for at least 20 years after your treatment. If Pap tests have been discontinued, your risk factors (such as having a new sexual partner) need to be reassessed to determine if screening should resume. Some women have medical problems that increase the chance of getting cervical cancer. In these cases, your health care provider may recommend more frequent screening and Pap tests. Colorectal Cancer  This type of cancer can be detected and often prevented.  Routine colorectal cancer screening usually begins at 73 years of age and continues through 73 years of age.  Your  health care provider may recommend screening at an earlier age if you have risk factors for colon cancer.  Your health care provider may also recommend using home test kits to check for hidden blood in the stool.  A small camera at the end of a tube can be used to examine your colon directly (sigmoidoscopy or colonoscopy). This is done to check for the earliest forms of colorectal cancer.  Routine screening usually begins at age 74.  Direct examination of the colon should be repeated every 5-10 years through 73 years of age. However, you may need to be screened more often if early forms of precancerous polyps or small growths are found. Skin Cancer  Check your skin from head to toe regularly.  Tell your health care provider about any new moles or changes in moles, especially if there is a change in a mole's shape or color.  Also tell your health care provider if you have a mole that is larger than the size of a pencil eraser.  Always use sunscreen.  Apply sunscreen liberally and repeatedly throughout the day.  Protect yourself by wearing long sleeves, pants, a wide-brimmed hat, and sunglasses whenever you are outside. HEART DISEASE, DIABETES, AND HIGH BLOOD PRESSURE   High blood pressure causes heart disease and increases the risk of stroke. High blood pressure is more likely to develop in:  People who have blood pressure in the high end of the normal range (130-139/85-89 mm Hg).  People who are overweight or obese.  People who are African American.  If you are 39-8 years of age, have your blood pressure checked every 3-5 years. If you are 63 years of age or older, have your blood pressure checked every year. You should have your blood pressure measured twice--once when you are at a hospital or clinic, and once when you are not at a hospital or clinic. Record the average of the two measurements. To check your blood pressure when you are not at a hospital or clinic, you can use:  An automated blood pressure machine at a pharmacy.  A home blood pressure monitor.  If you are between 78 years and 28 years old, ask your health care provider if you should take aspirin to prevent strokes.  Have regular diabetes screenings. This involves taking a blood sample to check your fasting blood sugar level.  If you are at a normal weight and have a low risk for diabetes, have this test once every three years after 73 years of age.  If you are overweight and have a high risk for diabetes, consider being tested at a younger age or more often. PREVENTING INFECTION  Hepatitis B  If you have a higher risk for hepatitis B, you should be screened for this virus. You are considered at high risk for hepatitis B if:  You were born in a country where hepatitis B is common. Ask your health care provider which countries are considered high risk.  Your parents were born in a high-risk country, and you have not been immunized against hepatitis B (hepatitis B  vaccine).  You have HIV or AIDS.  You use needles to inject street drugs.  You live with someone who has hepatitis B.  You have had sex with someone who has hepatitis B.  You get hemodialysis treatment.  You take certain medicines for conditions, including cancer, organ transplantation, and autoimmune conditions. Hepatitis C  Blood testing is recommended for:  Everyone born from [birth year] through 1963.  Anyone with known risk factors for hepatitis C. Sexually transmitted infections (STIs)  You should be screened for sexually transmitted infections (STIs) including gonorrhea and chlamydia if:  You are sexually active and are younger than 73 years of age.  You are older than 73 years of age and your health care provider tells you that you are at risk for this type of infection.  Your sexual activity has changed since you were last screened and you are at an increased risk for chlamydia or gonorrhea. Ask your health care provider if you are at risk.  If you do not have HIV, but are at risk, it may be recommended that you take a prescription medicine daily to prevent HIV infection. This is called pre-exposure prophylaxis (PrEP). You are considered at risk if:  You are sexually active and do not regularly use condoms or know the HIV status of your partner(s).  You take drugs by injection.  You are sexually active with a partner who has HIV. Talk with your health care provider about whether you are at high risk of being infected with HIV. If you choose to begin PrEP, you should first be tested for HIV. You should then be tested every 3 months for as long as you are taking PrEP.  PREGNANCY   If you are premenopausal and you may become pregnant, ask your health care provider about preconception counseling.  If you may become pregnant, take 400 to 800 micrograms (mcg) of folic acid every day.  If you want to prevent pregnancy, talk to your health care provider about birth control  (contraception). OSTEOPOROSIS AND MENOPAUSE   Osteoporosis is a disease in which the bones lose minerals and strength with aging. This can result in serious bone fractures. Your risk for osteoporosis can be identified using a bone density scan.  If you are 65 years of age or older, or if you are at risk for osteoporosis and fractures, ask your health care provider if you should be screened.  Ask your health care provider whether you should take a calcium or vitamin D supplement to lower your risk for osteoporosis.  Menopause may have certain physical symptoms and risks.  Hormone replacement therapy may reduce some of these symptoms and risks. Talk to your health care provider about whether hormone replacement therapy is right for you.  HOME CARE INSTRUCTIONS   Schedule regular health, dental, and eye exams.  Stay current with your immunizations.   Do not use any tobacco products including cigarettes, chewing tobacco, or electronic cigarettes.  If you are pregnant, do not drink alcohol.  If you are breastfeeding, limit how much and how often you drink alcohol.  Limit alcohol intake to no more than 1 drink per day for nonpregnant women. One drink equals 12 ounces of beer, 5 ounces of wine, or 1 ounces of hard liquor.  Do not use street drugs.  Do not share needles.  Ask your health care provider for help if you need support or information about quitting drugs.  Tell your health care provider if you often feel depressed.  Tell your health care provider if you have ever been abused or do not feel safe at home.   This information is not intended to replace advice given to you by your health care provider. Make sure you discuss any questions you have with your health care provider.   Document Released: 01/07/2011 Document Revised: 07/15/2014 Document Reviewed: 05/26/2013 Elsevier Interactive Patient Education 2016 [employer]  

## 2016-02-29 LAB — HEPATITIS C ANTIBODY: HCV Ab: NEGATIVE

## 2016-03-02 ENCOUNTER — Encounter: Payer: Self-pay | Admitting: Internal Medicine

## 2016-03-05 DIAGNOSIS — H8101 Meniere's disease, right ear: Secondary | ICD-10-CM | POA: Diagnosis not present

## 2016-03-05 DIAGNOSIS — H9011 Conductive hearing loss, unilateral, right ear, with unrestricted hearing on the contralateral side: Secondary | ICD-10-CM | POA: Diagnosis not present

## 2016-03-18 ENCOUNTER — Other Ambulatory Visit: Payer: Self-pay | Admitting: Internal Medicine

## 2016-04-01 ENCOUNTER — Ambulatory Visit (INDEPENDENT_AMBULATORY_CARE_PROVIDER_SITE_OTHER): Payer: Medicare Other

## 2016-04-01 DIAGNOSIS — Z23 Encounter for immunization: Secondary | ICD-10-CM

## 2016-04-08 DIAGNOSIS — H16223 Keratoconjunctivitis sicca, not specified as Sjogren's, bilateral: Secondary | ICD-10-CM | POA: Diagnosis not present

## 2016-04-08 DIAGNOSIS — H1859 Other hereditary corneal dystrophies: Secondary | ICD-10-CM | POA: Diagnosis not present

## 2016-04-08 DIAGNOSIS — H04123 Dry eye syndrome of bilateral lacrimal glands: Secondary | ICD-10-CM | POA: Diagnosis not present

## 2016-04-09 DIAGNOSIS — M5440 Lumbago with sciatica, unspecified side: Secondary | ICD-10-CM | POA: Diagnosis not present

## 2016-04-09 DIAGNOSIS — M5416 Radiculopathy, lumbar region: Secondary | ICD-10-CM | POA: Diagnosis not present

## 2016-04-09 DIAGNOSIS — M4726 Other spondylosis with radiculopathy, lumbar region: Secondary | ICD-10-CM | POA: Diagnosis not present

## 2016-04-09 DIAGNOSIS — M4316 Spondylolisthesis, lumbar region: Secondary | ICD-10-CM | POA: Diagnosis not present

## 2016-04-09 DIAGNOSIS — M5136 Other intervertebral disc degeneration, lumbar region: Secondary | ICD-10-CM | POA: Diagnosis not present

## 2016-04-18 ENCOUNTER — Encounter: Payer: Self-pay | Admitting: Internal Medicine

## 2016-05-20 ENCOUNTER — Other Ambulatory Visit (INDEPENDENT_AMBULATORY_CARE_PROVIDER_SITE_OTHER): Payer: Medicare Other

## 2016-05-20 ENCOUNTER — Encounter: Payer: Self-pay | Admitting: Internal Medicine

## 2016-05-20 ENCOUNTER — Ambulatory Visit (INDEPENDENT_AMBULATORY_CARE_PROVIDER_SITE_OTHER): Payer: Medicare Other | Admitting: Internal Medicine

## 2016-05-20 VITALS — BP 118/82 | HR 77 | Temp 98.0°F | Resp 16 | Ht 62.0 in | Wt 121.0 lb

## 2016-05-20 DIAGNOSIS — R1032 Left lower quadrant pain: Secondary | ICD-10-CM

## 2016-05-20 DIAGNOSIS — G8929 Other chronic pain: Secondary | ICD-10-CM | POA: Insufficient documentation

## 2016-05-20 LAB — URINALYSIS, ROUTINE W REFLEX MICROSCOPIC
Bilirubin Urine: NEGATIVE
Ketones, ur: NEGATIVE
Nitrite: NEGATIVE
PH: 7 (ref 5.0–8.0)
SPECIFIC GRAVITY, URINE: 1.015 (ref 1.000–1.030)
Total Protein, Urine: NEGATIVE
Urine Glucose: NEGATIVE
Urobilinogen, UA: 0.2 (ref 0.0–1.0)

## 2016-05-20 LAB — COMPREHENSIVE METABOLIC PANEL
ALT: 15 U/L (ref 0–35)
AST: 15 U/L (ref 0–37)
Albumin: 4.3 g/dL (ref 3.5–5.2)
Alkaline Phosphatase: 58 U/L (ref 39–117)
BILIRUBIN TOTAL: 0.3 mg/dL (ref 0.2–1.2)
BUN: 25 mg/dL — ABNORMAL HIGH (ref 6–23)
CO2: 33 meq/L — AB (ref 19–32)
CREATININE: 0.93 mg/dL (ref 0.40–1.20)
Calcium: 10.1 mg/dL (ref 8.4–10.5)
Chloride: 98 mEq/L (ref 96–112)
GFR: 62.79 mL/min (ref >60.00)
GLUCOSE: 93 mg/dL (ref 70–99)
Potassium: 3.6 mEq/L (ref 3.5–5.1)
Sodium: 139 mEq/L (ref 135–145)
Total Protein: 7.1 g/dL (ref 6.0–8.3)

## 2016-05-20 LAB — CBC WITH DIFFERENTIAL/PLATELET
BASOS ABS: 0 10*3/uL (ref 0.0–0.1)
Basophils Relative: 0.6 % (ref 0.0–3.0)
Eosinophils Absolute: 0.1 10*3/uL (ref 0.0–0.7)
Eosinophils Relative: 0.9 % (ref 0.0–5.0)
HEMATOCRIT: 40.6 % (ref 36.0–46.0)
Hemoglobin: 13.8 g/dL (ref 12.0–15.0)
LYMPHS PCT: 36.6 % (ref 12.0–46.0)
Lymphs Abs: 2.6 10*3/uL (ref 0.7–4.0)
MCHC: 33.9 g/dL (ref 30.0–36.0)
MCV: 92.6 fl (ref 78.0–100.0)
MONOS PCT: 6.5 % (ref 3.0–12.0)
Monocytes Absolute: 0.5 10*3/uL (ref 0.1–1.0)
NEUTROS PCT: 55.4 % (ref 43.0–77.0)
Neutro Abs: 3.9 10*3/uL (ref 1.4–7.7)
Platelets: 245 10*3/uL (ref 150.0–400.0)
RBC: 4.39 Mil/uL (ref 3.87–5.11)
RDW: 13.3 % (ref 11.5–15.5)
WBC: 7.1 10*3/uL (ref 4.0–10.5)

## 2016-05-20 NOTE — Patient Instructions (Addendum)
Blood work and urine tests were ordered.  Your results will be released to [place] (or called to you) after review, usually within 72hours after test completion. If any changes need to be made, you will be notified at that same time.   Medications reviewed and updated.  No changes recommended at this time.

## 2016-05-20 NOTE — Progress Notes (Signed)
Subjective:    Patient ID: Holly Richard, female    DOB: [birth year], 75 y.o.   MRN: [id number]  HPI She is here for an acute visit.   Side pain:  It started around may of this year.  It has gotten worse in the past month.  The pain is in her LLQ/groin area.  It is intermittent, but occurs daily.  She feels it more with sitting and then standing.  If she is bending a lot she notices it more.  She thinks taking ibuprofen may make it better.  The pain feels like a sore muscle - feels similar to menstrual cramps.  She sometimes has constipation.  She denies blood in her stool.  She is concerned about ovarian cancer.   She does have recurring herniated disc problems at L4-5.  She has intermittent pain across her back and the pain in her LLQ sometimes correlates with this.  She is unsure her LLQ pain is from her back or not.  In the past she has had pain in her left hip and upper leg, but this is very intermittent.  She follows with Dr Sherwood Gambler and will have an MRI next month and is considering surgery next year for her back.    She had a colonoscopy in June 2017 and it was normal.  Pap smear in May 2017.    Medications and allergies reviewed with patient and updated if appropriate.  Patient Active Problem List   Diagnosis Date Noted  . Lumbar stenosis 12/28/2014  . Hepatitis C antibody test positive 12/07/2014  . Left sided sciatica 11/04/2012  . Hypothyroidism 07/26/2010  . Hyperlipidemia 07/26/2010  . MIGRAINE HEADACHE 07/26/2010  . Meniere's disease 07/26/2010  . ARTHRITIS 07/26/2010  . ALLERGIC RHINITIS 07/05/2010  . DIVERTICULOSIS, COLON 07/05/2010  . Osteopenia 07/05/2010    Current Outpatient Prescriptions on File Prior to Visit  Medication Sig Dispense Refill  . acetaminophen (TYLENOL) 500 MG tablet Take 500 mg by mouth every 6 (six) hours as needed for mild pain or moderate pain.    Marland Kitchen aspirin 81 MG tablet Take 81 mg by mouth daily.      Marland Kitchen CALCIUM-VITAMIN D PO Take 1  tablet by mouth daily.     . fluticasone (FLONASE) 50 MCG/ACT nasal spray Place 1 spray into both nostrils daily as needed for allergies or rhinitis.    Marland Kitchen ibuprofen (ADVIL,MOTRIN) 200 MG tablet Take 200 mg by mouth every 6 (six) hours as needed for headache, mild pain or moderate pain.    Marland Kitchen levothyroxine (SYNTHROID, LEVOTHROID) 50 MCG tablet TAKE ONE TABLET BY MOUTH ONCE DAILY BEFORE BREAKFAST. 90 tablet 3  . Lifitegrast (XIIDRA) 5 % SOLN Apply 1 drop to eye 2 (two) times daily before a meal.    . Multiple Vitamins-Minerals (QC WOMENS DAILY MULTIVITAMIN) TABS Take 1 tablet by mouth daily.     . Omega-3 Fatty Acids (FISH OIL TRIPLE STRENGTH) 1400 MG CAPS Take 1 tablet by mouth daily.     Vladimir Faster Glycol-Propyl Glycol (SYSTANE) 0.4-0.3 % SOLN Apply to eye as needed.    . triamterene-hydrochlorothiazide (MAXZIDE-25) 37.5-25 MG per tablet Take 1 tablet by mouth daily.       No current facility-administered medications on file prior to visit.     Past Medical History:  Diagnosis Date  . ALLERGIC RHINITIS   . Allergy   . ARTHRITIS   . Arthritis   . DIVERTICULOSIS, COLON   . Hepatitis C antibody test positive  11/2014   negative RNA confirmation test  . HYPERLIPIDEMIA   . HYPOTHYROIDISM   . MENIERE'S DISEASE   . MIGRAINE HEADACHE   . OSTEOPENIA     Past Surgical History:  Procedure Laterality Date  . (R) Ear implants  10/2008   Hearing loss-coclear implant   . BUNIONECTOMY     X's - (R) 2/04 & (L) 3/04  . COLONOSCOPY    . LUMBAR LAMINECTOMY/DECOMPRESSION MICRODISCECTOMY Left 12/28/2014   Procedure: LUMBAR LAMINECTOMY/DECOMPRESSION MICRODISCECTOMY 2 LEVELS;  Surgeon: Jovita Gamma, MD;  Location: [facility];  Service: Neurosurgery;  Laterality: Left;  Left L34 laminotomy, foraminotomy and poss microdiskectomy with left L45 laminotomy, foraminotomy, resection of synovial cyst and poss microdiskectomy  . TONSILLECTOMY  1954    Social History   Social History  . Marital status:  Married    Spouse name: N/A  . Number of children: N/A  . Years of education: N/A   Social History Main Topics  . Smoking status: Never Smoker  . Smokeless tobacco: Never Used  . Alcohol use No  . Drug use: No  . Sexual activity: Not on file   Other Topics Concern  . Not on file   Social History Narrative   Married, lives with spouse-2 sons and 4 g-kids nearby.    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 Arthritis Mother   . Diabetes Mother   . Heart disease Mother   . Colon cancer Father 59  . Stroke Father   . Breast cancer Sister   . Diabetes Sister   . Lung cancer Sister   . Diabetes Brother   . Prostate cancer Brother   . Colon cancer Brother   . Brain cancer Other     Nephew  . Colon polyps Neg Hx   . Rectal cancer Neg Hx   . Stomach cancer Neg Hx     Review of Systems  Constitutional: Negative for appetite change, chills, fatigue (less energy than she did), fever and unexpected weight change.  Gastrointestinal: Positive for abdominal pain and constipation. Negative for blood in stool, diarrhea and nausea.       No gerd  Genitourinary: Negative for difficulty urinating, dysuria, frequency, hematuria, vaginal bleeding and vaginal discharge.       Slight incontinence  - stress related  Neurological: Negative for light-headedness and headaches.       Objective:   Vitals:   05/20/16 1301  BP: 118/82  Pulse: 77  Resp: 16  Temp: 98 F (36.7 C)   Filed Weights   05/20/16 1301  Weight: 121 lb (54.9 kg)   Body mass index is 22.13 kg/m.   Physical Exam  Constitutional: She appears well-developed and well-nourished. No distress.  Abdominal: Soft. Bowel sounds are normal. She exhibits no distension and no mass. There is no tenderness. There is no rebound and no guarding.  Musculoskeletal: She exhibits no edema.  Skin: She is not diaphoretic.        Assessment & Plan:   See Problem List  for Assessment and Plan of chronic medical problems.

## 2016-05-20 NOTE — Assessment & Plan Note (Signed)
Chronic for months - getting worse Pap and colonoscopy done this year and normal ? Ovarian mass, less likely GU or GI related, but does have some constipation Possibly thoracic or lumbar radiculopathy Will check pelvic and transvaginal US, cbc, cmp, ua, ucx She will discuss with dr Sherwood Gambler Can consider ct of A/P if needed

## 2016-05-20 NOTE — Progress Notes (Signed)
Pre visit review using our clinic review tool, if applicable. No additional management support is needed unless otherwise documented below in the visit note. 

## 2016-05-22 LAB — URINE CULTURE: Organism ID, Bacteria: NO GROWTH

## 2016-05-24 ENCOUNTER — Ambulatory Visit
Admission: RE | Admit: 2016-05-24 | Discharge: 2016-05-24 | Disposition: A | Discharge status: Home / Self Care | Payer: Medicare Other | Source: Ambulatory Visit | Attending: Internal Medicine | Admitting: Internal Medicine

## 2016-05-24 ENCOUNTER — Telehealth: Payer: Self-pay | Admitting: Internal Medicine

## 2016-05-24 DIAGNOSIS — R1032 Left lower quadrant pain: Principal | ICD-10-CM

## 2016-05-24 DIAGNOSIS — G8929 Other chronic pain: Secondary | ICD-10-CM

## 2016-05-24 NOTE — Telephone Encounter (Signed)
The ultrasound of her pelvis shows a possible cyst in her cervix - it is difficult to definitely tell what it is -- she needs to see gyn to have a pelvic exam.  If she has a gyn or knows of one she should make an appt -- if not I can refer her to one if she needs a referral.

## 2016-05-27 ENCOUNTER — Telehealth: Payer: Self-pay | Admitting: Emergency Medicine

## 2016-05-27 NOTE — Telephone Encounter (Signed)
Pt asked that you give her a call back. She has some information from another doctor for you. Please advise thanks.

## 2016-05-27 NOTE — Telephone Encounter (Signed)
LVM for pt to call back.

## 2016-05-27 NOTE — Telephone Encounter (Signed)
Spoke with pt to inform. Pt has an OBGYN office that she will call to make appt with.

## 2016-05-27 NOTE — Telephone Encounter (Signed)
Spoke with pt, needing [place] results faxed to [facility] (Fax [phone])

## 2016-05-28 NOTE — Telephone Encounter (Signed)
Results faxed.

## 2016-06-03 DIAGNOSIS — N888 Other specified noninflammatory disorders of cervix uteri: Secondary | ICD-10-CM | POA: Diagnosis not present

## 2016-06-11 ENCOUNTER — Ambulatory Visit (INDEPENDENT_AMBULATORY_CARE_PROVIDER_SITE_OTHER): Payer: Medicare Other | Admitting: General Practice

## 2016-06-11 DIAGNOSIS — Z23 Encounter for immunization: Secondary | ICD-10-CM

## 2016-06-11 NOTE — Progress Notes (Signed)
Injection given.   Erin Uecker J Eveleigh Crumpler, MD  

## 2016-08-22 DIAGNOSIS — N882 Stricture and stenosis of cervix uteri: Secondary | ICD-10-CM | POA: Diagnosis not present

## 2016-08-22 DIAGNOSIS — N888 Other specified noninflammatory disorders of cervix uteri: Secondary | ICD-10-CM | POA: Diagnosis not present

## 2016-08-27 DIAGNOSIS — M5416 Radiculopathy, lumbar region: Secondary | ICD-10-CM | POA: Diagnosis not present

## 2016-08-27 DIAGNOSIS — M5136 Other intervertebral disc degeneration, lumbar region: Secondary | ICD-10-CM | POA: Diagnosis not present

## 2016-08-27 DIAGNOSIS — M4316 Spondylolisthesis, lumbar region: Secondary | ICD-10-CM | POA: Diagnosis not present

## 2016-08-27 DIAGNOSIS — M4726 Other spondylosis with radiculopathy, lumbar region: Secondary | ICD-10-CM | POA: Diagnosis not present

## 2016-08-27 DIAGNOSIS — M5440 Lumbago with sciatica, unspecified side: Secondary | ICD-10-CM | POA: Diagnosis not present

## 2016-09-03 DIAGNOSIS — H9011 Conductive hearing loss, unilateral, right ear, with unrestricted hearing on the contralateral side: Secondary | ICD-10-CM | POA: Diagnosis not present

## 2016-10-29 DIAGNOSIS — H16223 Keratoconjunctivitis sicca, not specified as Sjogren's, bilateral: Secondary | ICD-10-CM | POA: Diagnosis not present

## 2016-10-29 DIAGNOSIS — H04123 Dry eye syndrome of bilateral lacrimal glands: Secondary | ICD-10-CM | POA: Diagnosis not present

## 2016-12-11 ENCOUNTER — Encounter: Payer: Self-pay | Admitting: Obstetrics & Gynecology

## 2016-12-11 ENCOUNTER — Ambulatory Visit (INDEPENDENT_AMBULATORY_CARE_PROVIDER_SITE_OTHER): Payer: Medicare Other | Admitting: Obstetrics & Gynecology

## 2016-12-11 ENCOUNTER — Other Ambulatory Visit: Payer: Self-pay | Admitting: Obstetrics & Gynecology

## 2016-12-11 VITALS — BP 122/70 | Ht 61.75 in | Wt 119.0 lb

## 2016-12-11 DIAGNOSIS — Z1231 Encounter for screening mammogram for malignant neoplasm of breast: Secondary | ICD-10-CM

## 2016-12-11 DIAGNOSIS — N952 Postmenopausal atrophic vaginitis: Secondary | ICD-10-CM

## 2016-12-11 DIAGNOSIS — N888 Other specified noninflammatory disorders of cervix uteri: Secondary | ICD-10-CM | POA: Diagnosis not present

## 2016-12-11 DIAGNOSIS — R1032 Left lower quadrant pain: Secondary | ICD-10-CM

## 2016-12-11 DIAGNOSIS — Z01411 Encounter for gynecological examination (general) (routine) with abnormal findings: Secondary | ICD-10-CM | POA: Diagnosis not present

## 2016-12-11 NOTE — Progress Notes (Signed)
Holly Richard [birth year] [id number]   History:    76 y.o.  G3P2A1L2  Married.  Grand-mother of 4.  RP:  Established patient presenting for annual gyn exam   HPI:  Menopause.  No HRT.  No PMB.  Continues to feel mild pain in the LLQ most of the time.  Scheduled to see her Orthopedist.  Miction normal.  BMs normal.  Breasts wnl.  Past medical history,surgical history, family history and social history were all reviewed and documented in the EPIC chart.  Gynecologic History No LMP recorded. Patient is postmenopausal. Contraception: post menopausal status Last Pap: 2017. Results were: normal Last mammogram:  11/2015. Results were: normal  Obstetric History OB History  Gravida Para Term Preterm AB Living  3 2     1 2   SAB TAB Ectopic Multiple Live Births  1            # Outcome Date GA Lbr Len/2nd Weight Sex Delivery Anes PTL Lv  3 SAB           2 Para           1 Para                ROS: A ROS was performed and pertinent positives and negatives are included in the history.  GENERAL: No fevers or chills. HEENT: No change in vision, no earache, sore throat or sinus congestion. NECK: No pain or stiffness. CARDIOVASCULAR: No chest pain or pressure. No palpitations. PULMONARY: No shortness of breath, cough or wheeze. GASTROINTESTINAL: No abdominal pain, nausea, vomiting or diarrhea, melena or bright red blood per rectum. GENITOURINARY: No urinary frequency, urgency, hesitancy or dysuria. MUSCULOSKELETAL: No joint or muscle pain, no back pain, no recent trauma. DERMATOLOGIC: No rash, no itching, no lesions. ENDOCRINE: No polyuria, polydipsia, no heat or cold intolerance. No recent change in weight. HEMATOLOGICAL: No anemia or easy bruising or bleeding. NEUROLOGIC: No headache, seizures, numbness, tingling or weakness. PSYCHIATRIC: No depression, no loss of interest in normal activity or change in sleep pattern.     Exam:   BP 122/70   Ht 5' 1.75" (1.568 m)   Wt 119 lb (54 kg)    BMI 21.94 kg/m   Body mass index is 21.94 kg/m.  General appearance : Well developed well nourished female. No acute distress HEENT: Eyes: no retinal hemorrhage or exudates,  Neck supple, trachea midline, no carotid bruits, no thyroidmegaly Lungs: Clear to auscultation, no rhonchi or wheezes, or rib retractions  Heart: Regular rate and rhythm, no murmurs or gallops Breast:Examined in sitting and supine position were symmetrical in appearance, no palpable masses or tenderness,  no skin retraction, no nipple inversion, no nipple discharge, no skin discoloration, no axillary or supraclavicular lymphadenopathy Abdomen: no palpable masses or tenderness, no rebound or guarding Extremities: no edema or skin discoloration or tenderness  Pelvic:  Bartholin, Urethra, Skene Glands: Within normal limits             Vagina: No gross lesions or discharge  Cervix: No gross lesions or discharge  Uterus  AV, normal size, shape and consistency, non-tender and mobile  Adnexa  Without masses or tenderness  Anus and perineum  normal    Assessment/Plan:  76 y.o. female for annual exam   1. Encounter for gynecological examination with abnormal finding Normal Gyn exam except Atrophic Vaginitis.  Pap normal 2017.  Will schedule Screening Mammo.  2. Post-menopause atrophic vaginitis ASxic, will not treat.  3. Cervical cyst  H/O cervical cyst on Pelvic US 2017.  Had cervical stenosis, with fluid drainage after cervical dilation.  Will reevaluate by [place]. - US Transvaginal Non-OB; Future  4. LLQ pain Probably associated with Lumbar pain, but will confirm normal left ovary by [place]. - US Transvaginal Non-OB; Future  Counseling on above issues >50% x 15 minutes.  Princess Bruins MD, 10:37 AM 12/11/2016

## 2016-12-11 NOTE — Patient Instructions (Signed)
1. Encounter for gynecological examination with abnormal finding Normal Gyn exam except Atrophic Vaginitis.  Pap normal 2017.  Will schedule Screening Mammo.  2. Post-menopause atrophic vaginitis ASxic, will not treat.  3. Cervical cyst H/O cervical cyst on Pelvic US 2017.  Had cervical stenosis, with fluid drainage after cervical dilation.  Will reevaluate by [place]. - US Transvaginal Non-OB; Future  4. LLQ pain Probably associated with Lumbar pain, but will confirm normal left ovary by [place]. - US Transvaginal Non-OB; Future  Holly Richard, it was a pleasure to see you today!  See you soon for your Pelvic US.

## 2016-12-19 ENCOUNTER — Ambulatory Visit: Payer: Medicare Other | Admitting: Obstetrics & Gynecology

## 2016-12-19 ENCOUNTER — Other Ambulatory Visit: Payer: Medicare Other

## 2016-12-23 ENCOUNTER — Ambulatory Visit (INDEPENDENT_AMBULATORY_CARE_PROVIDER_SITE_OTHER): Payer: Medicare Other | Admitting: Obstetrics & Gynecology

## 2016-12-23 ENCOUNTER — Ambulatory Visit (INDEPENDENT_AMBULATORY_CARE_PROVIDER_SITE_OTHER): Payer: Medicare Other

## 2016-12-23 ENCOUNTER — Other Ambulatory Visit: Payer: Self-pay | Admitting: Obstetrics & Gynecology

## 2016-12-23 ENCOUNTER — Encounter: Payer: Self-pay | Admitting: Obstetrics & Gynecology

## 2016-12-23 DIAGNOSIS — N888 Other specified noninflammatory disorders of cervix uteri: Secondary | ICD-10-CM

## 2016-12-23 DIAGNOSIS — R1032 Left lower quadrant pain: Secondary | ICD-10-CM

## 2016-12-23 DIAGNOSIS — D251 Intramural leiomyoma of uterus: Secondary | ICD-10-CM

## 2016-12-23 NOTE — Patient Instructions (Signed)
1. LLQ pain Negative Pelvic US.  Left ovary normal, atrophic.  No FF.  Reassured.  Will see Ortho tomorrow.  2. Cervical cyst Resolved, not seen on Pelvic US today.  Will schedule BD here.  Good to see you today Kimberlea!

## 2016-12-23 NOTE — Progress Notes (Signed)
    Holly Richard 04-21-1941 [id number]        76 y.o.  [id number]   RP:  LLQ pain/H/O cervical cyst for Pelvic US  Past medical history,surgical history, problem list, medications, allergies, family history and social history were all reviewed and documented in the EPIC chart.  Directed ROS with pertinent positives and negatives documented in the history of present illness/assessment and plan.  Exam:  There were no vitals filed for this visit. General appearance:  Normal  Pelvic US today:  T/V and T/A Retroflexed uterus with intramural calcified fibroids 1.3 cm and 1.8 cm.  Previous cervical cyst not seen.  Right and Left Ovaries atrophic, normal.  No adnexal mass.  No FF in CDS.   Assessment/Plan:  76 y.o. [id number]   1. LLQ pain Negative Pelvic US.  Left ovary normal, atrophic.  No FF.  Reassured.  Will see Ortho tomorrow.  2. Cervical cyst Resolved, not seen on Pelvic US today.  Will schedule BD here.  Counseling on above issues >50% x 15 minutes.   Princess Bruins MD, 12:52 PM 12/23/2016

## 2016-12-24 DIAGNOSIS — M5136 Other intervertebral disc degeneration, lumbar region: Secondary | ICD-10-CM | POA: Diagnosis not present

## 2016-12-24 DIAGNOSIS — M5126 Other intervertebral disc displacement, lumbar region: Secondary | ICD-10-CM | POA: Diagnosis not present

## 2016-12-24 DIAGNOSIS — M5440 Lumbago with sciatica, unspecified side: Secondary | ICD-10-CM | POA: Diagnosis not present

## 2016-12-24 DIAGNOSIS — M4726 Other spondylosis with radiculopathy, lumbar region: Secondary | ICD-10-CM | POA: Diagnosis not present

## 2016-12-24 DIAGNOSIS — M4316 Spondylolisthesis, lumbar region: Secondary | ICD-10-CM | POA: Diagnosis not present

## 2016-12-25 ENCOUNTER — Ambulatory Visit
Admission: RE | Admit: 2016-12-25 | Discharge: 2016-12-25 | Disposition: A | Discharge status: Home / Self Care | Payer: Medicare Other | Source: Ambulatory Visit | Attending: Obstetrics & Gynecology | Admitting: Obstetrics & Gynecology

## 2016-12-25 DIAGNOSIS — Z1231 Encounter for screening mammogram for malignant neoplasm of breast: Secondary | ICD-10-CM | POA: Diagnosis not present

## 2017-01-01 DIAGNOSIS — M4726 Other spondylosis with radiculopathy, lumbar region: Secondary | ICD-10-CM | POA: Diagnosis not present

## 2017-01-01 DIAGNOSIS — M47817 Spondylosis without myelopathy or radiculopathy, lumbosacral region: Secondary | ICD-10-CM | POA: Diagnosis not present

## 2017-01-07 ENCOUNTER — Other Ambulatory Visit: Payer: Self-pay | Admitting: Anesthesiology

## 2017-01-07 DIAGNOSIS — M8589 Other specified disorders of bone density and structure, multiple sites: Secondary | ICD-10-CM

## 2017-01-13 ENCOUNTER — Other Ambulatory Visit: Payer: Self-pay | Admitting: Gynecology

## 2017-01-13 DIAGNOSIS — M8589 Other specified disorders of bone density and structure, multiple sites: Secondary | ICD-10-CM

## 2017-01-16 ENCOUNTER — Ambulatory Visit (INDEPENDENT_AMBULATORY_CARE_PROVIDER_SITE_OTHER): Payer: Medicare Other

## 2017-01-16 ENCOUNTER — Other Ambulatory Visit: Payer: Self-pay | Admitting: Gynecology

## 2017-01-16 DIAGNOSIS — Z780 Asymptomatic menopausal state: Secondary | ICD-10-CM

## 2017-01-16 DIAGNOSIS — M8589 Other specified disorders of bone density and structure, multiple sites: Secondary | ICD-10-CM | POA: Diagnosis not present

## 2017-01-24 DIAGNOSIS — M5440 Lumbago with sciatica, unspecified side: Secondary | ICD-10-CM | POA: Diagnosis not present

## 2017-01-24 DIAGNOSIS — M4726 Other spondylosis with radiculopathy, lumbar region: Secondary | ICD-10-CM | POA: Diagnosis not present

## 2017-01-24 DIAGNOSIS — R030 Elevated blood-pressure reading, without diagnosis of hypertension: Secondary | ICD-10-CM | POA: Diagnosis not present

## 2017-01-24 DIAGNOSIS — M5136 Other intervertebral disc degeneration, lumbar region: Secondary | ICD-10-CM | POA: Diagnosis not present

## 2017-01-24 DIAGNOSIS — M5416 Radiculopathy, lumbar region: Secondary | ICD-10-CM | POA: Diagnosis not present

## 2017-01-24 DIAGNOSIS — M4316 Spondylolisthesis, lumbar region: Secondary | ICD-10-CM | POA: Diagnosis not present

## 2017-01-24 DIAGNOSIS — M5126 Other intervertebral disc displacement, lumbar region: Secondary | ICD-10-CM | POA: Diagnosis not present

## 2017-01-30 DIAGNOSIS — M48061 Spinal stenosis, lumbar region without neurogenic claudication: Secondary | ICD-10-CM | POA: Diagnosis not present

## 2017-01-30 DIAGNOSIS — M5116 Intervertebral disc disorders with radiculopathy, lumbar region: Secondary | ICD-10-CM | POA: Diagnosis not present

## 2017-01-30 DIAGNOSIS — M5136 Other intervertebral disc degeneration, lumbar region: Secondary | ICD-10-CM | POA: Diagnosis not present

## 2017-01-30 DIAGNOSIS — M4726 Other spondylosis with radiculopathy, lumbar region: Secondary | ICD-10-CM | POA: Diagnosis not present

## 2017-02-04 ENCOUNTER — Telehealth: Payer: Self-pay | Admitting: Gynecology

## 2017-02-04 ENCOUNTER — Encounter: Payer: Self-pay | Admitting: *Deleted

## 2017-02-04 NOTE — Telephone Encounter (Signed)
Tell patient that her most recent bone density shows osteopenia. When compared to prior studies it does not appear that she has had significant decline noting her 2011 DEXA showed a T score of the AP spine -1.4 and of the left femoral neck -1.6. DEXA now in 2018 showed T score of the AP spine -1.5 and of the left femoral neck -1.5. When we do a theoretical calculation as to what her 10 year fracture risk called the FRAX she has a risk of breaking any bone at 15% and of her hip at 5%. The recommendation is to discuss treatment options when the overall fracture risk exceeds 20% and the hip fracture risk exceeds 3%. She is therefore technically at an increased risk of hip fracture. Her to options would be to make an appointment and discuss treatment options with Dr. Dellis Filbert or not to consider treatment at this time and repeat the bone density in 2 years. Regardless I would maximize weightbearing exercise such as walking on a regular basis, have adequate calcium intake such as 1500 mg total dietary calcium daily and I would recommend having a vitamin D level checked at either her primary physician's office or our office to make sure she is in the therapeutic range.

## 2017-02-04 NOTE — Telephone Encounter (Signed)
Sent pt my chart message, asking her to let me know she received message.

## 2017-02-06 NOTE — Telephone Encounter (Signed)
Pt sent me a message back confirming she received my message.

## 2017-03-04 DIAGNOSIS — H8101 Meniere's disease, right ear: Secondary | ICD-10-CM | POA: Diagnosis not present

## 2017-03-04 DIAGNOSIS — H838X3 Other specified diseases of inner ear, bilateral: Secondary | ICD-10-CM | POA: Diagnosis not present

## 2017-03-04 DIAGNOSIS — H903 Sensorineural hearing loss, bilateral: Secondary | ICD-10-CM | POA: Diagnosis not present

## 2017-03-14 ENCOUNTER — Telehealth: Payer: Self-pay | Admitting: Internal Medicine

## 2017-03-14 NOTE — Telephone Encounter (Signed)
Patient Name: Holly Richard DOB: 1940/07/31 Initial Comment Caller states, she has an appt on Tuesday at 1 and 2 - RN and MD- blood work being done. Can she eat or not? Not sure on type of blood work - yearly. Veirfied Nurse Assessment Nurse: Cherie Dark, RN, Jarrett Soho Date/Time (Eastern Time): 03/14/2017 12:57:10 PM Confirm and document reason for call. If symptomatic, describe symptoms. ---Caller states she has an appt on Tuesday at 1pm with the nurse and at 2pm with the doctor. It is her yearly exam and is wondering if she is going to have blood work done. Wants to know if she can eat. Does the patient have any new or worsening symptoms? ---No Please document clinical information provided and list any resource used. ---Informed patient that normally yearly blood work is fasting which means nothing to eat after midnight, but she can have water and black coffee with no cream or sugar. Patient aware and was encouraged to call back PRN. Guidelines Guideline Title Affirmed Question Affirmed Notes Final Disposition User Clinical Call Kleindale, RN, Jarrett Soho

## 2017-03-17 NOTE — Progress Notes (Signed)
Pre visit review using our clinic review tool, if applicable. No additional management support is needed unless otherwise documented below in the visit note. 

## 2017-03-17 NOTE — Progress Notes (Signed)
Subjective:   Holly Richard is a 76 y.o. female who presents for Medicare Annual (Subsequent) preventive examination.  Review of Systems:  No ROS.  Medicare Wellness Visit. Additional risk factors are reflected in the social history.  Cardiac Risk Factors include: advanced age (>4men, >86 women);dyslipidemia Sleep patterns: has frequent nighttime awakenings, gets up 1 times nightly to void and sleeps 5-6 hours nightly.  Patient reports insomnia issues, discussed recommended sleep tips and stress reduction tips, education was attached to patient's AVS.  Home Safety/Smoke Alarms: Feels safe in home. Smoke alarms in place.  Living environment; residence and Firearm Safety: 1-story house/ trailer, no firearms.Lives husband, no needs for DME, good support system Seat Belt Safety/Bike Helmet: Wears seat belt.      Objective:     Vitals: BP 106/62   Pulse 76   Temp (!) 97.5 F (36.4 C)   Resp 20   Ht 5\' 2"  (1.575 m)   Wt 119 lb (54 kg)   SpO2 98%   BMI 21.77 kg/m   Body mass index is 21.77 kg/m.   Tobacco History  Smoking Status  . Never Smoker  Smokeless Tobacco  . Never Used     Counseling given: Not Answered   Past Medical History:  Diagnosis Date  . ALLERGIC RHINITIS   . Allergy   . ARTHRITIS   . Arthritis   . DIVERTICULOSIS, COLON   . Hepatitis C antibody test positive 11/2014   negative RNA confirmation test  . HYPERLIPIDEMIA   . HYPOTHYROIDISM   . MENIERE'S DISEASE   . MIGRAINE HEADACHE   . OSTEOPENIA    Past Surgical History:  Procedure Laterality Date  . (R) Ear implants  10/2008   Hearing loss-coclear implant   . BUNIONECTOMY     X's - (R) 2/04 & (L) 3/04  . COLONOSCOPY    . LUMBAR LAMINECTOMY/DECOMPRESSION MICRODISCECTOMY Left 12/28/2014   Procedure: LUMBAR LAMINECTOMY/DECOMPRESSION MICRODISCECTOMY 2 LEVELS;  Surgeon: Jovita Gamma, MD;  Location: [facility] [facility];  Service: Neurosurgery;  Laterality: Left;  Left L34 laminotomy, foraminotomy and  poss microdiskectomy with left L45 laminotomy, foraminotomy, resection of synovial cyst and poss microdiskectomy  . TONSILLECTOMY  1954   Family History  Problem Relation Age of Onset  . Arthritis Mother   . Diabetes Mother   . Heart disease Mother   . Stroke Mother   . Colon cancer Father 54  . Stroke Father   . Breast cancer Sister   . Diabetes Sister   . Lung cancer Sister   . Diabetes Brother   . Prostate cancer Brother   . Colon cancer Brother   . Brain cancer Other        Nephew  . Colon polyps Neg Hx   . Rectal cancer Neg Hx   . Stomach cancer Neg Hx    History  Sexual Activity  . Sexual activity: Not on file    Outpatient Encounter Prescriptions as of 03/18/2017  Medication Sig  . acetaminophen (TYLENOL) 500 MG tablet Take 500 mg by mouth every 6 (six) hours as needed for mild pain or moderate pain.  Marland Kitchen aspirin 81 MG tablet Take 81 mg by mouth daily.    Marland Kitchen CALCIUM-VITAMIN D PO Take 1 tablet by mouth daily.   . fluticasone (FLONASE) 50 MCG/ACT nasal spray Place 1 spray into both nostrils daily as needed for allergies or rhinitis.  Marland Kitchen levothyroxine (SYNTHROID, LEVOTHROID) 50 MCG tablet TAKE ONE TABLET BY MOUTH ONCE DAILY BEFORE BREAKFAST.  Marland Kitchen  Lifitegrast (XIIDRA) 5 % SOLN Apply 1 drop to eye 2 (two) times daily before a meal.  . Multiple Vitamins-Minerals (QC WOMENS DAILY MULTIVITAMIN) TABS Take 1 tablet by mouth daily.   . Omega-3 Fatty Acids (FISH OIL TRIPLE STRENGTH) 1400 MG CAPS Take 1 tablet by mouth daily.   Vladimir Faster Glycol-Propyl Glycol (SYSTANE) 0.4-0.3 % SOLN Apply to eye as needed.  . triamterene-hydrochlorothiazide (MAXZIDE-25) 37.5-25 MG per tablet Take 1 tablet by mouth daily.    Marland Kitchen UNABLE TO FIND instaflex joint support   No facility-administered encounter medications on file as of 03/18/2017.     Activities of Daily Living In your present state of health, do you have any difficulty performing the following activities: 03/18/2017  Hearing? Y  Vision? N    Difficulty concentrating or making decisions? N  Walking or climbing stairs? N  Dressing or bathing? N  Doing errands, shopping? N  Preparing Food and eating ? N  Using the Toilet? N  In the past six months, have you accidently leaked urine? N  Do you have problems with loss of bowel control? N  Managing your Medications? N  Managing your Finances? N  Housekeeping or managing your Housekeeping? N  Some recent data might be hidden    Patient Care Team: Binnie Rail, MD as PCP - General (Internal Medicine) Leta Baptist, MD (Otolaryngology) Jovita Gamma, MD (Neurosurgery) Suella Broad, MD (Physical Medicine and Rehabilitation) Princess Bruins, MD as Consulting Physician (Obstetrics and Gynecology)    Assessment:    Physical assessment deferred to PCP.  Exercise Activities and Dietary recommendations Current Exercise Habits: Home exercise routine, Type of exercise: walking;calisthenics;stretching, Time (Minutes): 50, Frequency (Times/Week): 5, Weekly Exercise (Minutes/Week): 250, Intensity: Mild  Diet (meal preparation, eat out, water intake, caffeinated beverages, dairy products, fruits and vegetables): in general, a "healthy" diet  , well balanced, eats a variety of fruits and vegetables daily, limits salt, fat/cholesterol, sugar, caffeine, drinks 1-2 glasses of water daily.  Reviewed heart healthy, encouraged patient to increase daily water intake.   Goals    . Stay as healthy and as independent as possible          Continue to eat healthy and exercise, enjoy life, family and worship God      Fall Risk Fall Risk  03/18/2017 02/28/2016 02/27/2015 11/08/2013 11/04/2012  Falls in the past year? No No No No No   Depression Screen PHQ 2/9 Scores 03/18/2017 02/28/2016 02/27/2015 11/08/2013  PHQ - 2 Score 0 0 0 0  PHQ- 9 Score 2 - - -     Cognitive Function MMSE - Mini Mental State Exam 03/18/2017  Orientation to time 5  Orientation to Place 5  Registration 3  Attention/  Calculation 5  Recall 3  Language- name 2 objects 2  Language- repeat 1  Language- follow 3 step command 3  Language- read & follow direction 1  Write a sentence 1  Copy design 1  Total score 30        Immunization History  Administered Date(s) Administered  . Hep A / Hep B 02/28/2016, 06/11/2016  . Hepatitis B, adult 04/01/2016  . Influenza Split 04/23/2011, 04/07/2012, 03/29/2013  . Influenza, High Dose Seasonal PF 03/18/2017  . Influenza,inj,Quad PF,6+ Mos 02/28/2016  . Pneumococcal Conjugate-13 02/27/2015  . Pneumococcal Polysaccharide-23 07/10/2004, 09/05/2009  . Td 06/19/2003, 11/08/2013  . Zoster 07/08/2005   Screening Tests Health Maintenance  Topic Date Due  . MAMMOGRAM  12/26/2018  . DEXA SCAN  01/17/2020  .  COLONOSCOPY  01/01/2021  . TETANUS/TDAP  11/09/2023  . INFLUENZA VACCINE  Completed  . PNA vac Low Risk Adult  Completed      Plan:  Continue doing brain stimulating activities (puzzles, reading, adult coloring books, staying active) to keep memory sharp.   Continue to eat heart healthy diet (full of fruits, vegetables, whole grains, lean protein, water--limit salt, fat, and sugar intake) and increase physical activity as tolerated.   I have personally reviewed and noted the following in the patient's chart:   . Medical and social history . Use of alcohol, tobacco or illicit drugs  . Current medications and supplements . Functional ability and status . Nutritional status . Physical activity . Advanced directives . List of other physicians . Vitals . Screenings to include cognitive, depression, and falls . Referrals and appointments  In addition, I have reviewed and discussed with patient certain preventive protocols, quality metrics, and best practice recommendations. A written personalized care plan for preventive services as well as general preventive health recommendations were provided to patient.     Michiel Cowboy,  RN  03/18/2017   Medical screening examination/treatment/procedure(s) were performed by non-physician practitioner and as supervising physician I was immediately available for consultation/collaboration. I agree with above. Binnie Rail, MD

## 2017-03-18 ENCOUNTER — Ambulatory Visit (INDEPENDENT_AMBULATORY_CARE_PROVIDER_SITE_OTHER): Payer: Medicare Other | Admitting: Internal Medicine

## 2017-03-18 ENCOUNTER — Other Ambulatory Visit (INDEPENDENT_AMBULATORY_CARE_PROVIDER_SITE_OTHER): Payer: Medicare Other

## 2017-03-18 ENCOUNTER — Encounter: Payer: Self-pay | Admitting: Internal Medicine

## 2017-03-18 VITALS — BP 106/62 | HR 76 | Temp 97.5°F | Resp 20 | Ht 62.0 in | Wt 119.0 lb

## 2017-03-18 DIAGNOSIS — E039 Hypothyroidism, unspecified: Secondary | ICD-10-CM

## 2017-03-18 DIAGNOSIS — Z0000 Encounter for general adult medical examination without abnormal findings: Secondary | ICD-10-CM | POA: Diagnosis not present

## 2017-03-18 DIAGNOSIS — E785 Hyperlipidemia, unspecified: Secondary | ICD-10-CM

## 2017-03-18 DIAGNOSIS — Z23 Encounter for immunization: Secondary | ICD-10-CM

## 2017-03-18 DIAGNOSIS — H8109 Meniere's disease, unspecified ear: Secondary | ICD-10-CM | POA: Diagnosis not present

## 2017-03-18 DIAGNOSIS — M8580 Other specified disorders of bone density and structure, unspecified site: Secondary | ICD-10-CM

## 2017-03-18 LAB — COMPREHENSIVE METABOLIC PANEL
ALBUMIN: 4.4 g/dL (ref 3.5–5.2)
ALT: 19 U/L (ref 0–35)
AST: 18 U/L (ref 0–37)
Alkaline Phosphatase: 48 U/L (ref 39–117)
BILIRUBIN TOTAL: 0.6 mg/dL (ref 0.2–1.2)
BUN: 23 mg/dL (ref 6–23)
CALCIUM: 10.2 mg/dL (ref 8.4–10.5)
CO2: 30 meq/L (ref 19–32)
CREATININE: 0.94 mg/dL (ref 0.40–1.20)
Chloride: 100 mEq/L (ref 96–112)
GFR: 61.88 mL/min (ref >60.00)
Glucose, Bld: 95 mg/dL (ref 70–99)
Potassium: 3.9 mEq/L (ref 3.5–5.1)
Sodium: 139 mEq/L (ref 135–145)
Total Protein: 7.2 g/dL (ref 6.0–8.3)

## 2017-03-18 LAB — CBC WITH DIFFERENTIAL/PLATELET
BASOS ABS: 0 10*3/uL (ref 0.0–0.1)
Basophils Relative: 0.7 % (ref 0.0–3.0)
EOS PCT: 0.6 % (ref 0.0–5.0)
Eosinophils Absolute: 0 10*3/uL (ref 0.0–0.7)
HEMATOCRIT: 43.5 % (ref 36.0–46.0)
HEMOGLOBIN: 14.6 g/dL (ref 12.0–15.0)
LYMPHS PCT: 36 % (ref 12.0–46.0)
Lymphs Abs: 2.3 10*3/uL (ref 0.7–4.0)
MCHC: 33.6 g/dL (ref 30.0–36.0)
MCV: 94.6 fl (ref 78.0–100.0)
MONO ABS: 0.3 10*3/uL (ref 0.1–1.0)
Monocytes Relative: 5.2 % (ref 3.0–12.0)
Neutro Abs: 3.7 10*3/uL (ref 1.4–7.7)
Neutrophils Relative %: 57.5 % (ref 43.0–77.0)
Platelets: 264 10*3/uL (ref 150.0–400.0)
RBC: 4.6 Mil/uL (ref 3.87–5.11)
RDW: 14 % (ref 11.5–15.5)
WBC: 6.4 10*3/uL (ref 4.0–10.5)

## 2017-03-18 LAB — LIPID PANEL
Cholesterol: 223 mg/dL — ABNORMAL HIGH (ref 0–200)
HDL: 71.8 mg/dL (ref >39.00)
LDL CALC: 138 mg/dL — AB (ref 0–99)
NONHDL: 150.89
Total CHOL/HDL Ratio: 3
Triglycerides: 65 mg/dL (ref 0.0–149.0)
VLDL: 13 mg/dL (ref 0.0–40.0)

## 2017-03-18 LAB — TSH: TSH: 1.35 u[IU]/mL (ref 0.35–4.50)

## 2017-03-18 NOTE — Assessment & Plan Note (Signed)
Never on medication Controlled with diet Check lipid panel, tsh, cmp Continue regular exercise, healthy diet

## 2017-03-18 NOTE — Assessment & Plan Note (Signed)
Radiating pain from left lower back.

## 2017-03-18 NOTE — Assessment & Plan Note (Signed)
Check tsh  Titrate med dose if needed  

## 2017-03-18 NOTE — Assessment & Plan Note (Signed)
dexa up to date Taking calcium and vitamin d daily Exercising regularly

## 2017-03-18 NOTE — Progress Notes (Signed)
Subjective:    Patient ID: [name], female    DOB: 1940-09-18, 76 y.o.   MRN: [id number]  HPI The patient is here for follow up.  Hypothyroidism:  She is taking her medication daily.  She denies any recent changes in energy or weight that are unexplained.   Meniere's disease:  She is following with ENT and taking maxzide daily.  She feels her meniere's is well controlled.  She occasionally feels unbalanced when walking.    Osteopenia:  Her dexa was done earlier this year.  She is exercising regularly.  She is taking her calcium and vitamin D daily.   Hyperlipidemia: She has never been on medication. She is compliant with a low fat/cholesterol diet. She is exercising regularly.   She walks three times a week and does exercises at church.  She is eating healthy.    Medications and allergies reviewed with patient and updated if appropriate.  Patient Active Problem List   Diagnosis Date Noted  . Chronic LLQ pain 05/20/2016  . Lumbar stenosis 12/28/2014  . Hepatitis C antibody test positive - false positive 12/07/2014  . Left sided sciatica 11/04/2012  . Hypothyroidism 07/26/2010  . Hyperlipidemia 07/26/2010  . MIGRAINE HEADACHE 07/26/2010  . Meniere's disease 07/26/2010  . ARTHRITIS 07/26/2010  . ALLERGIC RHINITIS 07/05/2010  . DIVERTICULOSIS, COLON 07/05/2010  . Osteopenia 07/05/2010    Current Outpatient Prescriptions on File Prior to Visit  Medication Sig Dispense Refill  . acetaminophen (TYLENOL) 500 MG tablet Take 500 mg by mouth every 6 (six) hours as needed for mild pain or moderate pain.    Marland Kitchen aspirin 81 MG tablet Take 81 mg by mouth daily.      Marland Kitchen CALCIUM-VITAMIN D PO Take 1 tablet by mouth daily.     . fluticasone (FLONASE) 50 MCG/ACT nasal spray Place 1 spray into both nostrils daily as needed for allergies or rhinitis.    Marland Kitchen levothyroxine (SYNTHROID, LEVOTHROID) 50 MCG tablet TAKE ONE TABLET BY MOUTH ONCE DAILY BEFORE BREAKFAST. 90 tablet 3  . Lifitegrast  (XIIDRA) 5 % SOLN Apply 1 drop to eye 2 (two) times daily before a meal.    . Multiple Vitamins-Minerals (QC WOMENS DAILY MULTIVITAMIN) TABS Take 1 tablet by mouth daily.     . Omega-3 Fatty Acids (FISH OIL TRIPLE STRENGTH) 1400 MG CAPS Take 1 tablet by mouth daily.     Vladimir Faster Glycol-Propyl Glycol (SYSTANE) 0.4-0.3 % SOLN Apply to eye as needed.    . triamterene-hydrochlorothiazide (MAXZIDE-25) 37.5-25 MG per tablet Take 1 tablet by mouth daily.      Marland Kitchen UNABLE TO FIND instaflex joint support     No current facility-administered medications on file prior to visit.     Past Medical History:  Diagnosis Date  . ALLERGIC RHINITIS   . Allergy   . ARTHRITIS   . Arthritis   . DIVERTICULOSIS, COLON   . Hepatitis C antibody test positive 11/2014   negative RNA confirmation test  . HYPERLIPIDEMIA   . HYPOTHYROIDISM   . MENIERE'S DISEASE   . MIGRAINE HEADACHE   . OSTEOPENIA     Past Surgical History:  Procedure Laterality Date  . (R) Ear implants  10/2008   Hearing loss-coclear implant   . BUNIONECTOMY     X's - (R) 2/04 & (L) 3/04  . COLONOSCOPY    . LUMBAR LAMINECTOMY/DECOMPRESSION MICRODISCECTOMY Left 12/28/2014   Procedure: LUMBAR LAMINECTOMY/DECOMPRESSION MICRODISCECTOMY 2 LEVELS;  Surgeon: Jovita Gamma, MD;  Location: [facility]  NEURO ORS;  Service: Neurosurgery;  Laterality: Left;  Left L34 laminotomy, foraminotomy and poss microdiskectomy with left L45 laminotomy, foraminotomy, resection of synovial cyst and poss microdiskectomy  . TONSILLECTOMY  1954    Social History   Social History  . Marital status: Married    Spouse name: N/A  . Number of children: N/A  . Years of education: N/A   Social History Main Topics  . Smoking status: Never Smoker  . Smokeless tobacco: Never Used  . Alcohol use No  . Drug use: No  . Sexual activity: Not Asked   Other Topics Concern  . None   Social History Narrative   Married, lives with spouse-2 sons and 4 g-kids nearby.    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 Arthritis Mother   . Diabetes Mother   . Heart disease Mother   . Stroke Mother   . Colon cancer Father 31  . Stroke Father   . Breast cancer Sister   . Diabetes Sister   . Lung cancer Sister   . Diabetes Brother   . Prostate cancer Brother   . Colon cancer Brother   . Brain cancer Other        Nephew  . Colon polyps Neg Hx   . Rectal cancer Neg Hx   . Stomach cancer Neg Hx     Review of Systems  Constitutional: Negative for chills, fatigue, fever and unexpected weight change.  Respiratory: Negative for cough, shortness of breath and wheezing.   Cardiovascular: Negative for chest pain, palpitations and leg swelling.  Neurological: Positive for dizziness (rare, feels off balanced at times). Negative for light-headedness and headaches.       Objective:   Vitals:   03/18/17 1322  BP: 106/62  Pulse: 76  Resp: 20  Temp: (!) 97.5 F (36.4 C)  SpO2: 98%   Wt Readings from Last 3 Encounters:  03/18/17 119 lb (54 kg)  12/11/16 119 lb (54 kg)  05/20/16 121 lb (54.9 kg)   Body mass index is 21.77 kg/m.   Physical Exam    Constitutional: Appears well-developed and well-nourished. No distress.  HENT:  Head: Normocephalic and atraumatic.  Neck: Neck supple. No tracheal deviation present. No thyromegaly present.  No cervical lymphadenopathy Cardiovascular: Normal rate, regular rhythm and normal heart sounds.   No murmur heard. No carotid bruit .  No edema Pulmonary/Chest: Effort normal and breath sounds normal. No respiratory distress. No has no wheezes. No rales.  Skin: Skin is warm and dry. Not diaphoretic.  Psychiatric: Normal mood and affect. Behavior is normal.     Assessment & Plan:    See Problem List for Assessment and Plan of chronic medical problems.

## 2017-03-18 NOTE — Patient Instructions (Signed)
Continue doing brain stimulating activities (puzzles, reading, adult coloring books, staying active) to keep memory sharp.   Continue to eat heart healthy diet (full of fruits, vegetables, whole grains, lean protein, water--limit salt, fat, and sugar intake) and increase physical activity as tolerated.   Holly Richard , Thank you for taking time to come for your Medicare Wellness Visit. I appreciate your ongoing commitment to your health goals. Please review the following plan we discussed and let me know if I can assist you in the future.   These are the goals we discussed: Goals    . Stay as healthy and as independent as possible          Continue to eat healthy and exercise, enjoy life, family and worship God       This is a list of the screening recommended for you and due dates:  Health Maintenance  Topic Date Due  . Flu Shot  02/05/2017  . Mammogram  12/26/2018  . DEXA scan (bone density measurement)  01/17/2020  . Colon Cancer Screening  01/01/2021  . Tetanus Vaccine  11/09/2023  . Pneumonia vaccines  Completed   Insomnia Insomnia is a sleep disorder that makes it difficult to fall asleep or to stay asleep. Insomnia can cause tiredness (fatigue), low energy, difficulty concentrating, mood swings, and poor performance at work or school. There are three different ways to classify insomnia:  Difficulty falling asleep.  Difficulty staying asleep.  Waking up too early in the morning.  Any type of insomnia can be long-term (chronic) or short-term (acute). Both are common. Short-term insomnia usually lasts for three months or less. Chronic insomnia occurs at least three times a week for longer than three months. What are the causes? Insomnia may be caused by another condition, situation, or substance, such as:  Anxiety.  Certain medicines.  Gastroesophageal reflux disease (GERD) or other gastrointestinal conditions.  Asthma or other breathing conditions.  Restless legs  syndrome, sleep apnea, or other sleep disorders.  Chronic pain.  Menopause. This may include hot flashes.  Stroke.  Abuse of alcohol, tobacco, or illegal drugs.  Depression.  Caffeine.  Neurological disorders, such as Alzheimer disease.  An overactive thyroid (hyperthyroidism).  The cause of insomnia may not be known. What increases the risk? Risk factors for insomnia include:  Gender. Women are more commonly affected than men.  Age. Insomnia is more common as you get older.  Stress. This may involve your professional or personal life.  Income. Insomnia is more common in people with lower income.  Lack of exercise.  Irregular work schedule or night shifts.  Traveling between different time zones.  What are the signs or symptoms? If you have insomnia, trouble falling asleep or trouble staying asleep is the main symptom. This may lead to other symptoms, such as:  Feeling fatigued.  Feeling nervous about going to sleep.  Not feeling rested in the morning.  Having trouble concentrating.  Feeling irritable, anxious, or depressed.  How is this treated? Treatment for insomnia depends on the cause. If your insomnia is caused by an underlying condition, treatment will focus on addressing the condition. Treatment may also include:  Medicines to help you sleep.  Counseling or therapy.  Lifestyle adjustments.  Follow these instructions at home:  Take medicines only as directed by your health care provider.  Keep regular sleeping and waking hours. Avoid naps.  Keep a sleep diary to help you and your health care provider figure out what could be causing  your insomnia. Include: ? When you sleep. ? When you wake up during the night. ? How well you sleep. ? How rested you feel the next day. ? Any side effects of medicines you are taking. ? What you eat and drink.  Make your bedroom a comfortable place where it is easy to fall asleep: ? Put up shades or special  blackout curtains to block light from outside. ? Use a white noise machine to block noise. ? Keep the temperature cool.  Exercise regularly as directed by your health care provider. Avoid exercising right before bedtime.  Use relaxation techniques to manage stress. Ask your health care provider to suggest some techniques that may work well for you. These may include: ? Breathing exercises. ? Routines to release muscle tension. ? Visualizing peaceful scenes.  Cut back on alcohol, caffeinated beverages, and cigarettes, especially close to bedtime. These can disrupt your sleep.  Do not overeat or eat spicy foods right before bedtime. This can lead to digestive discomfort that can make it hard for you to sleep.  Limit screen use before bedtime. This includes: ? Watching TV. ? Using your smartphone, tablet, and computer.  Stick to a routine. This can help you fall asleep faster. Try to do a quiet activity, brush your teeth, and go to bed at the same time each night.  Get out of bed if you are still awake after 15 minutes of trying to sleep. Keep the lights down, but try reading or doing a quiet activity. When you feel sleepy, go back to bed.  Make sure that you drive carefully. Avoid driving if you feel very sleepy.  Keep all follow-up appointments as directed by your health care provider. This is important. Contact a health care provider if:  You are tired throughout the day or have trouble in your daily routine due to sleepiness.  You continue to have sleep problems or your sleep problems get worse. Get help right away if:  You have serious thoughts about hurting yourself or someone else. This information is not intended to replace advice given to you by your health care provider. Make sure you discuss any questions you have with your health care provider. Document Released: 06/21/2000 Document Revised: 11/24/2015 Document Reviewed: 03/25/2014 Elsevier Interactive Patient Education   2018 [place]. Influenza Virus Vaccine injection What is this medicine? INFLUENZA VIRUS VACCINE (in floo EN zuh VAHY ruhs vak SEEN) helps to reduce the risk of getting influenza also known as the flu. The vaccine only helps protect you against some strains of the flu. This medicine may be used for other purposes; ask your health care provider or pharmacist if you have questions. COMMON BRAND NAME(S): Afluria, Agriflu, Alfuria, FLUAD, Fluarix, Fluarix Quadrivalent, Flublok, Flublok Quadrivalent, FLUCELVAX, Flulaval, Fluvirin, Fluzone, Fluzone High-Dose, Fluzone Intradermal What should I tell my health care provider before I take this medicine? They need to know if you have any of these conditions: -bleeding disorder like hemophilia -fever or infection -Guillain-Barre syndrome or other neurological problems -immune system problems -infection with the human immunodeficiency virus (HIV) or AIDS -low blood platelet counts -multiple sclerosis -an unusual or allergic reaction to influenza virus vaccine, latex, other medicines, foods, dyes, or preservatives. Different brands of vaccines contain different allergens. Some may contain latex or eggs. Talk to your doctor about your allergies to make sure that you get the right vaccine. -pregnant or trying to get pregnant -breast-feeding How should I use this medicine? This vaccine is for injection into a  muscle or under the skin. It is given by a health care professional. A copy of Vaccine Information Statements will be given before each vaccination. Read this sheet carefully each time. The sheet may change frequently. Talk to your healthcare provider to see which vaccines are right for you. Some vaccines should not be used in all age groups. Overdosage: If you think you have taken too much of this medicine contact a poison control center or emergency room at once. NOTE: This medicine is only for you. Do not share this medicine with others. What if I  miss a dose? This does not apply. What may interact with this medicine? -chemotherapy or radiation therapy -medicines that lower your immune system like etanercept, anakinra, infliximab, and adalimumab -medicines that treat or prevent blood clots like warfarin -phenytoin -steroid medicines like prednisone or cortisone -theophylline -vaccines This list may not describe all possible interactions. Give your health care provider a list of all the medicines, herbs, non-prescription drugs, or dietary supplements you use. Also tell them if you smoke, drink alcohol, or use illegal drugs. Some items may interact with your medicine. What should I watch for while using this medicine? Report any side effects that do not go away within 3 days to your doctor or health care professional. Call your health care provider if any unusual symptoms occur within 6 weeks of receiving this vaccine. You may still catch the flu, but the illness is not usually as bad. You cannot get the flu from the vaccine. The vaccine will not protect against colds or other illnesses that may cause fever. The vaccine is needed every year. What side effects may I notice from receiving this medicine? Side effects that you should report to your doctor or health care professional as soon as possible: -allergic reactions like skin rash, itching or hives, swelling of the face, lips, or tongue Side effects that usually do not require medical attention (report to your doctor or health care professional if they continue or are bothersome): -fever -headache -muscle aches and pains -pain, tenderness, redness, or swelling at the injection site -tiredness This list may not describe all possible side effects. Call your doctor for medical advice about side effects. You may report side effects to FDA at 1-800-FDA-1088. Where should I keep my medicine? The vaccine will be given by a health care professional in a clinic, pharmacy, doctor's office, or  other health care setting. You will not be given vaccine doses to store at home. NOTE: This sheet is a summary. It may not cover all possible information. If you have questions about this medicine, talk to your doctor, pharmacist, or health care provider.  2018 Elsevier/Gold Standard (2015-01-13 10:07:28)

## 2017-03-18 NOTE — Assessment & Plan Note (Addendum)
Following with ENT Taking maxzide daily Well controlled

## 2017-03-21 ENCOUNTER — Encounter: Payer: Self-pay | Admitting: Internal Medicine

## 2017-03-29 ENCOUNTER — Other Ambulatory Visit: Payer: Self-pay | Admitting: Internal Medicine

## 2017-04-01 DIAGNOSIS — M4316 Spondylolisthesis, lumbar region: Secondary | ICD-10-CM | POA: Diagnosis not present

## 2017-04-01 DIAGNOSIS — M5136 Other intervertebral disc degeneration, lumbar region: Secondary | ICD-10-CM | POA: Diagnosis not present

## 2017-04-01 DIAGNOSIS — M4726 Other spondylosis with radiculopathy, lumbar region: Secondary | ICD-10-CM | POA: Diagnosis not present

## 2017-04-01 DIAGNOSIS — M5440 Lumbago with sciatica, unspecified side: Secondary | ICD-10-CM | POA: Diagnosis not present

## 2017-04-01 DIAGNOSIS — M5416 Radiculopathy, lumbar region: Secondary | ICD-10-CM | POA: Diagnosis not present

## 2017-05-01 DIAGNOSIS — H1859 Other hereditary corneal dystrophies: Secondary | ICD-10-CM | POA: Diagnosis not present

## 2017-05-01 DIAGNOSIS — H04123 Dry eye syndrome of bilateral lacrimal glands: Secondary | ICD-10-CM | POA: Diagnosis not present

## 2017-05-01 DIAGNOSIS — H16223 Keratoconjunctivitis sicca, not specified as Sjogren's, bilateral: Secondary | ICD-10-CM | POA: Diagnosis not present

## 2017-05-13 DIAGNOSIS — M5136 Other intervertebral disc degeneration, lumbar region: Secondary | ICD-10-CM | POA: Diagnosis not present

## 2017-05-13 DIAGNOSIS — M4726 Other spondylosis with radiculopathy, lumbar region: Secondary | ICD-10-CM | POA: Diagnosis not present

## 2017-05-13 DIAGNOSIS — M5416 Radiculopathy, lumbar region: Secondary | ICD-10-CM | POA: Diagnosis not present

## 2017-05-13 DIAGNOSIS — I10 Essential (primary) hypertension: Secondary | ICD-10-CM | POA: Diagnosis not present

## 2017-05-13 DIAGNOSIS — M5440 Lumbago with sciatica, unspecified side: Secondary | ICD-10-CM | POA: Diagnosis not present

## 2017-05-13 DIAGNOSIS — M4316 Spondylolisthesis, lumbar region: Secondary | ICD-10-CM | POA: Diagnosis not present

## 2017-05-13 DIAGNOSIS — M5126 Other intervertebral disc displacement, lumbar region: Secondary | ICD-10-CM | POA: Diagnosis not present

## 2017-07-15 DIAGNOSIS — M5440 Lumbago with sciatica, unspecified side: Secondary | ICD-10-CM | POA: Diagnosis not present

## 2017-07-15 DIAGNOSIS — M5126 Other intervertebral disc displacement, lumbar region: Secondary | ICD-10-CM | POA: Diagnosis not present

## 2017-07-15 DIAGNOSIS — M4316 Spondylolisthesis, lumbar region: Secondary | ICD-10-CM | POA: Diagnosis not present

## 2017-07-15 DIAGNOSIS — M4726 Other spondylosis with radiculopathy, lumbar region: Secondary | ICD-10-CM | POA: Diagnosis not present

## 2017-07-15 DIAGNOSIS — M5136 Other intervertebral disc degeneration, lumbar region: Secondary | ICD-10-CM | POA: Diagnosis not present

## 2017-07-15 DIAGNOSIS — M5416 Radiculopathy, lumbar region: Secondary | ICD-10-CM | POA: Diagnosis not present

## 2017-09-03 DIAGNOSIS — H903 Sensorineural hearing loss, bilateral: Secondary | ICD-10-CM | POA: Diagnosis not present

## 2017-09-03 DIAGNOSIS — H838X3 Other specified diseases of inner ear, bilateral: Secondary | ICD-10-CM | POA: Diagnosis not present

## 2017-09-03 DIAGNOSIS — H8101 Meniere's disease, right ear: Secondary | ICD-10-CM | POA: Diagnosis not present

## 2017-09-10 ENCOUNTER — Other Ambulatory Visit: Payer: Self-pay | Admitting: Family Medicine

## 2017-09-10 DIAGNOSIS — Z1231 Encounter for screening mammogram for malignant neoplasm of breast: Secondary | ICD-10-CM

## 2017-12-12 ENCOUNTER — Encounter: Payer: Self-pay | Admitting: Obstetrics & Gynecology

## 2017-12-12 ENCOUNTER — Ambulatory Visit (INDEPENDENT_AMBULATORY_CARE_PROVIDER_SITE_OTHER): Payer: Medicare Other | Admitting: Obstetrics & Gynecology

## 2017-12-12 VITALS — BP 126/84 | Ht 61.5 in | Wt 118.0 lb

## 2017-12-12 DIAGNOSIS — Z01419 Encounter for gynecological examination (general) (routine) without abnormal findings: Secondary | ICD-10-CM

## 2017-12-12 DIAGNOSIS — Z780 Asymptomatic menopausal state: Secondary | ICD-10-CM

## 2017-12-12 DIAGNOSIS — M8589 Other specified disorders of bone density and structure, multiple sites: Secondary | ICD-10-CM

## 2017-12-12 NOTE — Progress Notes (Signed)
Holly Richard May 16, 1941 [id number]   History:    77 y.o. G3P2A1L2  RP:  Established patient presenting for annual gyn exam   HPI: Menopause on no hormone replacement therapy.  No postmenopausal bleeding.  No pelvic pain.  Had left lower quadrant pain last year for which a pelvic ultrasound was done in June 2018, the pelvic ultrasound was negative with normal ovaries.  She saw the neurosurgeon the next day and was prescribed Diflifenac 50 mg tab daily.  This has completely controlled her left lower quadrant pains.  Urine and bowel movements normal.  Abstinent.  Breasts normal.  Body mass index 21.93.  Active lifestyle.  Health labs with family physician.  Past medical history,surgical history, family history and social history were all reviewed and documented in the EPIC chart.  Gynecologic History No LMP recorded. Patient is postmenopausal. Contraception: abstinence and post menopausal status Last Pap: 2017. Results were: Negative Last mammogram: 12/2016. Results were: Negative Bone Density: 01/2017 Osteopenia T-Score -1.7 Rt femoral neck Colonoscopy: 2017  Obstetric History OB History  Gravida Para Term Preterm AB Living  3 2     1 2   SAB TAB Ectopic Multiple Live Births  1            # Outcome Date GA Lbr Len/2nd Weight Sex Delivery Anes PTL Lv  3 SAB           2 Para           1 Para              ROS: A ROS was performed and pertinent positives and negatives are included in the history.  GENERAL: No fevers or chills. HEENT: No change in vision, no earache, sore throat or sinus congestion. NECK: No pain or stiffness. CARDIOVASCULAR: No chest pain or pressure. No palpitations. PULMONARY: No shortness of breath, cough or wheeze. GASTROINTESTINAL: No abdominal pain, nausea, vomiting or diarrhea, melena or bright red blood per rectum. GENITOURINARY: No urinary frequency, urgency, hesitancy or dysuria. MUSCULOSKELETAL: No joint or muscle pain, no back pain, no recent trauma.  DERMATOLOGIC: No rash, no itching, no lesions. ENDOCRINE: No polyuria, polydipsia, no heat or cold intolerance. No recent change in weight. HEMATOLOGICAL: No anemia or easy bruising or bleeding. NEUROLOGIC: No headache, seizures, numbness, tingling or weakness. PSYCHIATRIC: No depression, no loss of interest in normal activity or change in sleep pattern.     Exam:   BP 126/84   Ht 5' 1.5" (1.562 m)   Wt 118 lb (53.5 kg)   BMI 21.93 kg/m   Body mass index is 21.93 kg/m.  General appearance : Well developed well nourished female. No acute distress HEENT: Eyes: no retinal hemorrhage or exudates,  Neck supple, trachea midline, no carotid bruits, no thyroidmegaly Lungs: Clear to auscultation, no rhonchi or wheezes, or rib retractions  Heart: Regular rate and rhythm, no murmurs or gallops Breast:Examined in sitting and supine position were symmetrical in appearance, no palpable masses or tenderness,  no skin retraction, no nipple inversion, no nipple discharge, no skin discoloration, no axillary or supraclavicular lymphadenopathy Abdomen: no palpable masses or tenderness, no rebound or guarding Extremities: no edema or skin discoloration or tenderness  Pelvic: Vulva: Normal             Vagina: No gross lesions or discharge  Cervix: No gross lesions or discharge  Uterus  AV, normal size, shape and consistency, non-tender and mobile  Adnexa  Without masses or tenderness  Anus: Normal  Assessment/Plan:  77 y.o. female for annual exam   1. Well female exam with routine gynecological exam Normal gynecologic exam.  Pap test negative in 2017.  Breast exam normal.  Patient is scheduled for her screening mammogram June 2019.  Health labs with family physician.  2. Menopause present Well on no hormone replacement therapy.  No postmenopausal bleeding.  3. Osteopenia of multiple sites Recommend vitamin D supplements, calcium rich nutrition and regular weightbearing physical activity.  Will  repeat bone density in July 2020.  Princess Bruins MD, 2:07 PM 12/12/2017

## 2017-12-12 NOTE — Patient Instructions (Signed)
1. Well female exam with routine gynecological exam Normal gynecologic exam.  Pap test negative in 2017.  Breast exam normal.  Patient is scheduled for her screening mammogram June 2019.  Health labs with family physician.  2. Menopause present Well on no hormone replacement therapy.  No postmenopausal bleeding.  3. Osteopenia of multiple sites Recommend vitamin D supplements, calcium rich nutrition and regular weightbearing physical activity.  Will repeat bone density in July 2020.  Reah, it was a pleasure seeing you today!

## 2017-12-31 ENCOUNTER — Ambulatory Visit
Admission: RE | Admit: 2017-12-31 | Discharge: 2017-12-31 | Disposition: A | Discharge status: Home / Self Care | Payer: Medicare Other | Source: Ambulatory Visit | Attending: Family Medicine | Admitting: Family Medicine

## 2017-12-31 DIAGNOSIS — Z1231 Encounter for screening mammogram for malignant neoplasm of breast: Secondary | ICD-10-CM

## 2018-01-06 DIAGNOSIS — M5126 Other intervertebral disc displacement, lumbar region: Secondary | ICD-10-CM | POA: Diagnosis not present

## 2018-01-06 DIAGNOSIS — M5416 Radiculopathy, lumbar region: Secondary | ICD-10-CM | POA: Diagnosis not present

## 2018-01-06 DIAGNOSIS — M5136 Other intervertebral disc degeneration, lumbar region: Secondary | ICD-10-CM | POA: Diagnosis not present

## 2018-01-06 DIAGNOSIS — M4316 Spondylolisthesis, lumbar region: Secondary | ICD-10-CM | POA: Diagnosis not present

## 2018-01-06 DIAGNOSIS — M4726 Other spondylosis with radiculopathy, lumbar region: Secondary | ICD-10-CM | POA: Diagnosis not present

## 2018-01-06 DIAGNOSIS — M5440 Lumbago with sciatica, unspecified side: Secondary | ICD-10-CM | POA: Diagnosis not present

## 2018-03-04 DIAGNOSIS — H8101 Meniere's disease, right ear: Secondary | ICD-10-CM | POA: Diagnosis not present

## 2018-03-04 DIAGNOSIS — H903 Sensorineural hearing loss, bilateral: Secondary | ICD-10-CM | POA: Diagnosis not present

## 2018-03-04 DIAGNOSIS — H838X3 Other specified diseases of inner ear, bilateral: Secondary | ICD-10-CM | POA: Diagnosis not present

## 2018-03-18 NOTE — Progress Notes (Addendum)
Subjective:   Holly Richard is a 77 y.o. female who presents for Medicare Annual (Subsequent) preventive examination.  Review of Systems:  No ROS.  Medicare Wellness Visit. Additional risk factors are reflected in the social history.  Cardiac Risk Factors include: advanced age (>56men, >15 women);dyslipidemia Sleep patterns: has frequent nighttime awakenings, gets up 1-2 times nightly to void and sleeps 5-6 hours nightly. Patient reports insomnia issues, discussed recommended sleep tips and stress reduction tips. Relevant patient education assigned to patient using Emmi.  Home Safety/Smoke Alarms: Feels safe in home. Smoke alarms in place.  Living environment; residence and Firearm Safety: 1-story house/ trailer, no firearms. Lives with husband, no needs for DME, good support system Seat Belt Safety/Bike Helmet: Wears seat belt.    Objective:     Vitals: BP 115/62   Pulse 82   Temp 98.3 F (36.8 C)   Resp 17   Ht 5\' 2"  (1.575 m)   Wt 118 lb (53.5 kg)   SpO2 99%   BMI 21.58 kg/m   Body mass index is 21.58 kg/m.  Advanced Directives 03/19/2018 03/18/2017 01/02/2016 12/19/2015 04/21/2015 03/29/2015 12/23/2014  Does Patient Have a Medical Advance Directive? Yes Yes Yes Yes No Yes Yes  Type of Paramedic of [place];Living will [place];Living will - [place];Living will [place];Living will [place];Living will -  Does patient want to make changes to medical advance directive? - - - - - No - Patient declined -  Copy of [place] in Chart? No - copy requested No - copy requested No - copy requested - - No - copy requested No - copy requested    Tobacco Social History   Tobacco Use  Smoking Status Never Smoker  Smokeless Tobacco Never Used     Counseling given: Not Answered  Past Medical History:  Diagnosis Date  . ALLERGIC RHINITIS   . Allergy   .  ARTHRITIS   . Arthritis   . DIVERTICULOSIS, COLON   . Hepatitis C antibody test positive 11/2014   negative RNA confirmation test  . HYPERLIPIDEMIA   . HYPOTHYROIDISM   . MENIERE'S DISEASE   . MIGRAINE HEADACHE   . OSTEOPENIA    Past Surgical History:  Procedure Laterality Date  . (R) Ear implants  10/2008   Hearing loss-coclear implant   . BUNIONECTOMY     X's - (R) 2/04 & (L) 3/04  . COLONOSCOPY    . LUMBAR LAMINECTOMY/DECOMPRESSION MICRODISCECTOMY Left 12/28/2014   Procedure: LUMBAR LAMINECTOMY/DECOMPRESSION MICRODISCECTOMY 2 LEVELS;  Surgeon: Jovita Gamma, MD;  Location: [facility];  Service: Neurosurgery;  Laterality: Left;  Left L34 laminotomy, foraminotomy and poss microdiskectomy with left L45 laminotomy, foraminotomy, resection of synovial cyst and poss microdiskectomy  . TONSILLECTOMY  1954   Family History  Problem Relation Age of Onset  . Arthritis Mother   . Diabetes Mother   . Heart disease Mother   . Stroke Mother   . Colon cancer Father 38  . Stroke Father   . Breast cancer Sister   . Diabetes Sister   . Lung cancer Sister   . Diabetes Brother   . Prostate cancer Brother   . Colon cancer Brother   . Brain cancer Other        Nephew  . Colon polyps Neg Hx   . Rectal cancer Neg Hx   . Stomach cancer Neg Hx    Social History  Socioeconomic History  . Marital status: Married    Spouse name: Not on file  . Number of children: 2  . Years of education: Not on file  . Highest education level: Not on file  Occupational History  . Not on file  Social Needs  . Financial resource strain: Not hard at all  . Food insecurity:    Worry: Never true    Inability: Never true  . Transportation needs:    Medical: No    Non-medical: No  Tobacco Use  . Smoking status: Never Smoker  . Smokeless tobacco: Never Used  Substance and Sexual Activity  . Alcohol use: No    Alcohol/week: 0.0 standard drinks  . Drug use: No  . Sexual activity: Not Currently     Comment: 1ST intercourse- 20, married- 43 yrs   Lifestyle  . Physical activity:    Days per week: 3 days    Minutes per session: 60 min  . Stress: Not at all  Relationships  . Social connections:    Talks on phone: More than three times a week    Gets together: More than three times a week    Attends religious service: More than 4 times per year    Active member of club or organization: Yes    Attends meetings of clubs or organizations: More than 4 times per year    Relationship status: Married  Other Topics Concern  . Not on file  Social History Narrative   Married, lives with spouse-2 sons and 4 g-kids nearby.    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 acetaminophen (TYLENOL) 500 MG tablet Take 500 mg by mouth every 6 (six) hours as needed for mild pain or moderate pain.  Marland Kitchen CALCIUM-VITAMIN D PO Take 1 tablet by mouth daily.   . Diclofenac Potassium 50 MG PACK Take 75 mg by mouth every other day.   . fluticasone (FLONASE) 50 MCG/ACT nasal spray Place 1 spray into both nostrils daily as needed for allergies or rhinitis.  Marland Kitchen levothyroxine (SYNTHROID, LEVOTHROID) 50 MCG tablet TAKE ONE TABLET BY MOUTH ONCE DAILY BEFORE  BREAKFAST  . Lifitegrast (XIIDRA) 5 % SOLN Apply 1 drop to eye 2 (two) times daily before a meal.  . Multiple Vitamins-Minerals (QC WOMENS DAILY MULTIVITAMIN) TABS Take 1 tablet by mouth daily.   . Omega-3 Fatty Acids (FISH OIL TRIPLE STRENGTH) 1400 MG CAPS Take 3 tablets by mouth daily.   Vladimir Faster Glycol-Propyl Glycol (SYSTANE) 0.4-0.3 % SOLN Apply to eye as needed.  . triamterene-hydrochlorothiazide (MAXZIDE-25) 37.5-25 MG per tablet Take 1 tablet by mouth daily.    Marland Kitchen UNABLE TO FIND instaflex joint support  . [DISCONTINUED] aspirin 81 MG tablet Take 81 mg by mouth daily.     No facility-administered encounter medications on file as of 03/19/2018.     Activities of Daily Living In  your present state of health, do you have any difficulty performing the following activities: 03/19/2018  Hearing? N  Vision? N  Difficulty concentrating or making decisions? N  Walking or climbing stairs? N  Dressing or bathing? N  Doing errands, shopping? N  Preparing Food and eating ? N  Using the Toilet? N  In the past six months, have you accidently leaked urine? N  Do you have problems with loss of bowel control? N  Managing your Medications? N  Managing your Finances? N  Housekeeping or managing  your Housekeeping? N  Some recent data might be hidden    Patient Care Team: Binnie Rail, MD as PCP - General (Internal Medicine) Leta Baptist, MD (Otolaryngology) Jovita Gamma, MD (Neurosurgery) Suella Broad, MD (Physical Medicine and Rehabilitation) Princess Bruins, MD as Consulting Physician (Obstetrics and Gynecology)    Assessment:   This is a routine wellness examination for [place]. Physical assessment deferred to PCP.   Exercise Activities and Dietary recommendations Current Exercise Habits: Home exercise routine;Structured exercise class, Type of exercise: walking;stretching;calisthenics, Time (Minutes): 50, Frequency (Times/Week): 4, Weekly Exercise (Minutes/Week): 200, Intensity: Mild, Exercise limited by: orthopedic condition(s)  Diet (meal preparation, eat out, water intake, caffeinated beverages, dairy products, fruits and vegetables): in general, a "healthy" diet  , well balanced   Reviewed heart healthy diet. Encouraged patient to increase daily water and healthy fluid intake.  Goals    . Patient Stated     Exercise more by doing a class at church, eat healthier by increasing fruits and vegetables, continue to travel, enjoy church, and enjoy life.    . Stay as healthy and as independent as possible     Continue to eat healthy and exercise, enjoy life, family and worship God       Fall Risk Fall Risk  03/19/2018 03/18/2017 02/28/2016 02/27/2015 11/08/2013    Falls in the past year? No No No No No   Depression Screen PHQ 2/9 Scores 03/19/2018 03/18/2017 02/28/2016 02/27/2015  PHQ - 2 Score 0 0 0 0  PHQ- 9 Score - 2 - -     Cognitive Function MMSE - Mini Mental State Exam 03/19/2018 03/18/2017  Orientation to time 5 5  Orientation to Place 5 5  Registration 3 3  Attention/ Calculation 5 5  Recall 3 3  Language- name 2 objects 2 2  Language- repeat 1 1  Language- follow 3 step command 3 3  Language- read & follow direction 1 1  Write a sentence 1 1  Copy design 1 1  Total score 30 30        Immunization History  Administered Date(s) Administered  . Hep A / Hep B 02/28/2016, 06/11/2016  . Hepatitis B, adult 04/01/2016  . Influenza Split 04/23/2011, 04/07/2012, 03/29/2013  . Influenza, High Dose Seasonal PF 03/18/2017, 03/19/2018  . Influenza,inj,Quad PF,6+ Mos 02/28/2016  . Pneumococcal Conjugate-13 02/27/2015  . Pneumococcal Polysaccharide-23 07/10/2004, 09/05/2009  . Td 06/19/2003, 11/08/2013  . Zoster 07/08/2005   Screening Tests Health Maintenance  Topic Date Due  . INFLUENZA VACCINE  02/05/2018  . MAMMOGRAM  01/01/2020  . DEXA SCAN  01/17/2020  . COLONOSCOPY  01/01/2021  . TETANUS/TDAP  11/09/2023  . PNA vac Low Risk Adult  Completed      Plan:     Continue doing brain stimulating activities (puzzles, reading, adult coloring books, staying active) to keep memory sharp.   Continue to eat heart healthy diet (full of fruits, vegetables, whole grains, lean protein, water--limit salt, fat, and sugar intake) and increase physical activity as tolerated.   I have personally reviewed and noted the following in the patient's chart:   . Medical and social history . Use of alcohol, tobacco or illicit drugs  . Current medications and supplements . Functional ability and status . Nutritional status . Physical activity . Advanced directives . List of other physicians . Vitals . Screenings to include cognitive,  depression, and falls . Referrals and appointments  In addition, I have reviewed and discussed with patient certain preventive protocols,  quality metrics, and best practice recommendations. A written personalized care plan for preventive services as well as general preventive health recommendations were provided to patient.     Michiel Cowboy, RN  03/19/2018    Medical screening examination/treatment/procedure(s) were performed by non-physician practitioner and as supervising physician I was immediately available for consultation/collaboration. I agree with above. Binnie Rail, MD

## 2018-03-18 NOTE — Patient Instructions (Addendum)
  Test(s) ordered today. Your results will be released to MyChart (or called to you) after review, usually within 72hours after test completion. If any changes need to be made, you will be notified at that same time.   Medications reviewed and updated.  No changes recommended at this time.   Please followup in one year   

## 2018-03-18 NOTE — Progress Notes (Signed)
Subjective:    Patient ID: Holly Richard, female    DOB: July 23, 1940, 77 y.o.   MRN: [id number]  HPI The patient is here for follow up.  Hypothyroidism:  She is taking her medication daily.  She denies any recent changes in energy or weight that are unexplained.  She feels her energy level is not quite as good as it was, but feels that may be related to age.    Hyperlipidemia: She is not on any medication. She is compliant with a low fat/cholesterol diet. She is exercising regularly - 3 mornings a week for one mile, yard work.    Meniere's disease:  She follows with ENT every 6 months and takes maxzide daily.  Her symptoms are well controlled.  She denies any dizziness.  She hs hearing loss.    Osteopenia;  She is exercising regularly and taking calcium and vitamin d daily. Her dexa is up to date.       Medications and allergies reviewed with patient and updated if appropriate.  Patient Active Problem List   Diagnosis Date Noted  . Chronic LLQ pain 05/20/2016  . Lumbar stenosis 12/28/2014  . Hepatitis C antibody test positive - false positive 12/07/2014  . Left sided sciatica 11/04/2012  . Hypothyroidism 07/26/2010  . Hyperlipidemia 07/26/2010  . MIGRAINE HEADACHE 07/26/2010  . Meniere's disease 07/26/2010  . ARTHRITIS 07/26/2010  . ALLERGIC RHINITIS 07/05/2010  . DIVERTICULOSIS, COLON 07/05/2010  . Osteopenia 07/05/2010    Current Outpatient Medications on File Prior to Visit  Medication Sig Dispense Refill  . acetaminophen (TYLENOL) 500 MG tablet Take 500 mg by mouth every 6 (six) hours as needed for mild pain or moderate pain.    Marland Kitchen CALCIUM-VITAMIN D PO Take 1 tablet by mouth daily.     . Diclofenac Potassium 50 MG PACK Take 75 mg by mouth every other day.     . fluticasone (FLONASE) 50 MCG/ACT nasal spray Place 1 spray into both nostrils daily as needed for allergies or rhinitis.    Marland Kitchen levothyroxine (SYNTHROID, LEVOTHROID) 50 MCG tablet TAKE ONE TABLET BY MOUTH ONCE  DAILY BEFORE  BREAKFAST 90 tablet 3  . Lifitegrast (XIIDRA) 5 % SOLN Apply 1 drop to eye 2 (two) times daily before a meal.    . Multiple Vitamins-Minerals (QC WOMENS DAILY MULTIVITAMIN) TABS Take 1 tablet by mouth daily.     . Omega-3 Fatty Acids (FISH OIL TRIPLE STRENGTH) 1400 MG CAPS Take 3 tablets by mouth daily.     Vladimir Faster Glycol-Propyl Glycol (SYSTANE) 0.4-0.3 % SOLN Apply to eye as needed.    . triamterene-hydrochlorothiazide (MAXZIDE-25) 37.5-25 MG per tablet Take 1 tablet by mouth daily.      Marland Kitchen UNABLE TO FIND instaflex joint support     No current facility-administered medications on file prior to visit.     Past Medical History:  Diagnosis Date  . ALLERGIC RHINITIS   . Allergy   . ARTHRITIS   . Arthritis   . DIVERTICULOSIS, COLON   . Hepatitis C antibody test positive 11/2014   negative RNA confirmation test  . HYPERLIPIDEMIA   . HYPOTHYROIDISM   . MENIERE'S DISEASE   . MIGRAINE HEADACHE   . OSTEOPENIA     Past Surgical History:  Procedure Laterality Date  . (R) Ear implants  10/2008   Hearing loss-coclear implant   . BUNIONECTOMY     X's - (R) 2/04 & (L) 3/04  . COLONOSCOPY    . LUMBAR  LAMINECTOMY/DECOMPRESSION MICRODISCECTOMY Left 12/28/2014   Procedure: LUMBAR LAMINECTOMY/DECOMPRESSION MICRODISCECTOMY 2 LEVELS;  Surgeon: Jovita Gamma, MD;  Location: [facility];  Service: Neurosurgery;  Laterality: Left;  Left L34 laminotomy, foraminotomy and poss microdiskectomy with left L45 laminotomy, foraminotomy, resection of synovial cyst and poss microdiskectomy  . TONSILLECTOMY  1954    Social History   Socioeconomic History  . Marital status: Married    Spouse name: Not on file  . Number of children: 2  . Years of education: Not on file  . Highest education level: Not on file  Occupational History  . Not on file  Social Needs  . Financial resource strain: Not hard at all  . Food insecurity:    Worry: Never true    Inability: Never true  .  Transportation needs:    Medical: No    Non-medical: No  Tobacco Use  . Smoking status: Never Smoker  . Smokeless tobacco: Never Used  Substance and Sexual Activity  . Alcohol use: No    Alcohol/week: 0.0 standard drinks  . Drug use: No  . Sexual activity: Not Currently    Comment: 1ST intercourse- 20, married- 74 yrs   Lifestyle  . Physical activity:    Days per week: 3 days    Minutes per session: 60 min  . Stress: Not at all  Relationships  . Social connections:    Talks on phone: More than three times a week    Gets together: More than three times a week    Attends religious service: More than 4 times per year    Active member of club or organization: Yes    Attends meetings of clubs or organizations: More than 4 times per year    Relationship status: Married  Other Topics Concern  . Not on file  Social History Narrative   Married, lives with spouse-2 sons and 4 g-kids nearby.    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 Arthritis Mother   . Diabetes Mother   . Heart disease Mother   . Stroke Mother   . Colon cancer Father 31  . Stroke Father   . Breast cancer Sister   . Diabetes Sister   . Lung cancer Sister   . Diabetes Brother   . Prostate cancer Brother   . Colon cancer Brother   . Brain cancer Other        Nephew  . Colon polyps Neg Hx   . Rectal cancer Neg Hx   . Stomach cancer Neg Hx     Review of Systems  Constitutional: Negative for chills and fever.  HENT: Positive for hearing loss.   Respiratory: Negative for cough, shortness of breath and wheezing.   Cardiovascular: Negative for chest pain, palpitations and leg swelling.  Gastrointestinal: Negative for abdominal pain.  Neurological: Positive for headaches (occasional). Negative for dizziness and light-headedness.       Objective:   Vitals:   03/19/18 1039  BP: 115/62  Pulse: 82  Resp: 17  Temp: 98.3 F (36.8 C)  SpO2: 98%     BP Readings from Last 3 Encounters:  03/19/18 115/62  03/19/18 115/62  12/12/17 126/84   Wt Readings from Last 3 Encounters:  03/19/18 118 lb (53.5 kg)  03/19/18 118 lb (53.5 kg)  12/12/17 118 lb (53.5 kg)   Body mass index is 21.58 kg/m.   Physical Exam    Constitutional: Appears well-developed  and well-nourished. No distress.  HENT:  Head: Normocephalic and atraumatic.  Neck: Neck supple. No tracheal deviation present. No thyromegaly present.  No cervical lymphadenopathy Cardiovascular: Normal rate, regular rhythm and normal heart sounds.   No murmur heard. No carotid bruit .  No edema Pulmonary/Chest: Effort normal and breath sounds normal. No respiratory distress. No has no wheezes. No rales.  Skin: Skin is warm and dry. Not diaphoretic.  Psychiatric: Normal mood and affect. Behavior is normal.      Assessment & Plan:    See Problem List for Assessment and Plan of chronic medical problems.

## 2018-03-19 ENCOUNTER — Encounter: Payer: Self-pay | Admitting: Internal Medicine

## 2018-03-19 ENCOUNTER — Ambulatory Visit (INDEPENDENT_AMBULATORY_CARE_PROVIDER_SITE_OTHER): Payer: Medicare Other | Admitting: Internal Medicine

## 2018-03-19 ENCOUNTER — Other Ambulatory Visit (INDEPENDENT_AMBULATORY_CARE_PROVIDER_SITE_OTHER): Payer: Medicare Other

## 2018-03-19 ENCOUNTER — Ambulatory Visit (INDEPENDENT_AMBULATORY_CARE_PROVIDER_SITE_OTHER): Payer: Medicare Other | Admitting: *Deleted

## 2018-03-19 VITALS — BP 115/62 | HR 82 | Temp 98.3°F | Resp 17 | Ht 62.0 in | Wt 118.0 lb

## 2018-03-19 DIAGNOSIS — Z23 Encounter for immunization: Secondary | ICD-10-CM | POA: Diagnosis not present

## 2018-03-19 DIAGNOSIS — E785 Hyperlipidemia, unspecified: Secondary | ICD-10-CM

## 2018-03-19 DIAGNOSIS — R768 Other specified abnormal immunological findings in serum: Secondary | ICD-10-CM

## 2018-03-19 DIAGNOSIS — Z0000 Encounter for general adult medical examination without abnormal findings: Secondary | ICD-10-CM | POA: Diagnosis not present

## 2018-03-19 DIAGNOSIS — M8589 Other specified disorders of bone density and structure, multiple sites: Secondary | ICD-10-CM

## 2018-03-19 DIAGNOSIS — E039 Hypothyroidism, unspecified: Secondary | ICD-10-CM | POA: Diagnosis not present

## 2018-03-19 DIAGNOSIS — H8109 Meniere's disease, unspecified ear: Secondary | ICD-10-CM | POA: Diagnosis not present

## 2018-03-19 LAB — CBC WITH DIFFERENTIAL/PLATELET
BASOS ABS: 0 10*3/uL (ref 0.0–0.1)
BASOS PCT: 0.5 % (ref 0.0–3.0)
EOS PCT: 0.7 % (ref 0.0–5.0)
Eosinophils Absolute: 0 10*3/uL (ref 0.0–0.7)
HCT: 40.6 % (ref 36.0–46.0)
Hemoglobin: 13.8 g/dL (ref 12.0–15.0)
LYMPHS ABS: 2.1 10*3/uL (ref 0.7–4.0)
Lymphocytes Relative: 34.4 % (ref 12.0–46.0)
MCHC: 33.9 g/dL (ref 30.0–36.0)
MCV: 91.1 fl (ref 78.0–100.0)
MONO ABS: 0.3 10*3/uL (ref 0.1–1.0)
MONOS PCT: 5.5 % (ref 3.0–12.0)
NEUTROS ABS: 3.5 10*3/uL (ref 1.4–7.7)
NEUTROS PCT: 58.9 % (ref 43.0–77.0)
PLATELETS: 241 10*3/uL (ref 150.0–400.0)
RBC: 4.46 Mil/uL (ref 3.87–5.11)
RDW: 13.1 % (ref 11.5–15.5)
WBC: 6 10*3/uL (ref 4.0–10.5)

## 2018-03-19 LAB — COMPREHENSIVE METABOLIC PANEL
ALK PHOS: 46 U/L (ref 39–117)
ALT: 17 U/L (ref 0–35)
AST: 13 U/L (ref 0–37)
Albumin: 4.3 g/dL (ref 3.5–5.2)
BUN: 24 mg/dL — AB (ref 6–23)
CO2: 30 mEq/L (ref 19–32)
Calcium: 9.8 mg/dL (ref 8.4–10.5)
Chloride: 100 mEq/L (ref 96–112)
Creatinine, Ser: 0.91 mg/dL (ref 0.40–1.20)
GFR: 64.06 mL/min (ref >60.00)
GLUCOSE: 89 mg/dL (ref 70–99)
POTASSIUM: 3.5 meq/L (ref 3.5–5.1)
SODIUM: 138 meq/L (ref 135–145)
TOTAL PROTEIN: 7.2 g/dL (ref 6.0–8.3)
Total Bilirubin: 0.7 mg/dL (ref 0.2–1.2)

## 2018-03-19 LAB — LIPID PANEL
CHOLESTEROL: 188 mg/dL (ref 0–200)
HDL: 57.8 mg/dL (ref >39.00)
LDL Cholesterol: 116 mg/dL — ABNORMAL HIGH (ref 0–99)
NONHDL: 130.34
TRIGLYCERIDES: 74 mg/dL (ref 0.0–149.0)
Total CHOL/HDL Ratio: 3
VLDL: 14.8 mg/dL (ref 0.0–40.0)

## 2018-03-19 LAB — TSH: TSH: 0.09 u[IU]/mL — AB (ref 0.35–4.50)

## 2018-03-19 NOTE — Assessment & Plan Note (Signed)
Clinically euthyroid Check tsh  Titrate med dose if needed  

## 2018-03-19 NOTE — Assessment & Plan Note (Signed)
dexa up to date Exercising  Taking cal/vitamin d

## 2018-03-19 NOTE — Patient Instructions (Addendum)
Continue doing brain stimulating activities (puzzles, reading, adult coloring books, staying active) to keep memory sharp.   Continue to eat heart healthy diet (full of fruits, vegetables, whole grains, lean protein, water--limit salt, fat, and sugar intake) and increase physical activity as tolerated.   Holly Richard , Thank you for taking time to come for your Medicare Wellness Visit. I appreciate your ongoing commitment to your health goals. Please review the following plan we discussed and let me know if I can assist you in the future.   These are the goals we discussed: Goals    . Patient Stated     Exercise more by doing a class at church, eat healthier by increasing fruits and vegetables, continue to travel, enjoy church, and enjoy life.    . Stay as healthy and as independent as possible     Continue to eat healthy and exercise, enjoy life, family and worship God       This is a list of the screening recommended for you and due dates:  Health Maintenance  Topic Date Due  . Flu Shot  02/05/2018  . Mammogram  01/01/2020  . DEXA scan (bone density measurement)  01/17/2020  . Colon Cancer Screening  01/01/2021  . Tetanus Vaccine  11/09/2023  . Pneumonia vaccines  Completed   Influenza Virus Vaccine injection What is this medicine? INFLUENZA VIRUS VACCINE (in floo EN zuh VAHY ruhs vak SEEN) helps to reduce the risk of getting influenza also known as the flu. The vaccine only helps protect you against some strains of the flu. This medicine may be used for other purposes; ask your health care provider or pharmacist if you have questions. COMMON BRAND NAME(S): Afluria, Agriflu, Alfuria, FLUAD, Fluarix, Fluarix Quadrivalent, Flublok, Flublok Quadrivalent, FLUCELVAX, Flulaval, Fluvirin, Fluzone, Fluzone High-Dose, Fluzone Intradermal What should I tell my health care provider before I take this medicine? They need to know if you have any of these conditions: -bleeding disorder like  hemophilia -fever or infection -Guillain-Barre syndrome or other neurological problems -immune system problems -infection with the human immunodeficiency virus (HIV) or AIDS -low blood platelet counts -multiple sclerosis -an unusual or allergic reaction to influenza virus vaccine, latex, other medicines, foods, dyes, or preservatives. Different brands of vaccines contain different allergens. Some may contain latex or eggs. Talk to your doctor about your allergies to make sure that you get the right vaccine. -pregnant or trying to get pregnant -breast-feeding How should I use this medicine? This vaccine is for injection into a muscle or under the skin. It is given by a health care professional. A copy of Vaccine Information Statements will be given before each vaccination. Read this sheet carefully each time. The sheet may change frequently. Talk to your healthcare provider to see which vaccines are right for you. Some vaccines should not be used in all age groups. Overdosage: If you think you have taken too much of this medicine contact a poison control center or emergency room at once. NOTE: This medicine is only for you. Do not share this medicine with others. What if I miss a dose? This does not apply. What may interact with this medicine? -chemotherapy or radiation therapy -medicines that lower your immune system like etanercept, anakinra, infliximab, and adalimumab -medicines that treat or prevent blood clots like warfarin -phenytoin -steroid medicines like prednisone or cortisone -theophylline -vaccines This list may not describe all possible interactions. Give your health care provider a list of all the medicines, herbs, non-prescription drugs, or dietary  supplements you use. Also tell them if you smoke, drink alcohol, or use illegal drugs. Some items may interact with your medicine. What should I watch for while using this medicine? Report any side effects that do not go away  within 3 days to your doctor or health care professional. Call your health care provider if any unusual symptoms occur within 6 weeks of receiving this vaccine. You may still catch the flu, but the illness is not usually as bad. You cannot get the flu from the vaccine. The vaccine will not protect against colds or other illnesses that may cause fever. The vaccine is needed every year. What side effects may I notice from receiving this medicine? Side effects that you should report to your doctor or health care professional as soon as possible: -allergic reactions like skin rash, itching or hives, swelling of the face, lips, or tongue Side effects that usually do not require medical attention (report to your doctor or health care professional if they continue or are bothersome): -fever -headache -muscle aches and pains -pain, tenderness, redness, or swelling at the injection site -tiredness This list may not describe all possible side effects. Call your doctor for medical advice about side effects. You may report side effects to FDA at 1-800-FDA-1088. Where should I keep my medicine? The vaccine will be given by a health care professional in a clinic, pharmacy, doctor's office, or other health care setting. You will not be given vaccine doses to store at home. NOTE: This sheet is a summary. It may not cover all possible information. If you have questions about this medicine, talk to your doctor, pharmacist, or health care provider.  2018 Elsevier/Gold Standard (2015-01-13 10:07:28) Health Maintenance, Female Adopting a healthy lifestyle and getting preventive care can go a long way to promote health and wellness. Talk with your health care provider about what schedule of regular examinations is right for you. This is a good chance for you to check in with your provider about disease prevention and staying healthy. In between checkups, there are plenty of things you can do on your own. Experts have  done a lot of research about which lifestyle changes and preventive measures are most likely to keep you healthy. Ask your health care provider for more information. Weight and diet Eat a healthy diet  Be sure to include plenty of vegetables, fruits, low-fat dairy products, and lean protein.  Do not eat a lot of foods high in solid fats, added sugars, or salt.  Get regular exercise. This is one of the most important things you can do for your health. ? Most adults should exercise for at least 150 minutes each week. The exercise should increase your heart rate and make you sweat (moderate-intensity exercise). ? Most adults should also do strengthening exercises at least twice a week. This is in addition to the moderate-intensity exercise.  Maintain a healthy weight  Body mass index (BMI) is a measurement that can be used to identify possible weight problems. It estimates body fat based on height and weight. Your health care provider can help determine your BMI and help you achieve or maintain a healthy weight.  For females 67 years of age and older: ? A BMI below 18.5 is considered underweight. ? A BMI of 18.5 to 24.9 is normal. ? A BMI of 25 to 29.9 is considered overweight. ? A BMI of 30 and above is considered obese.  Watch levels of cholesterol and blood lipids  You should start  having your blood tested for lipids and cholesterol at 75 years of age, then have this test every 5 years.  You may need to have your cholesterol levels checked more often if: ? Your lipid or cholesterol levels are high. ? You are older than 75 years of age. ? You are at high risk for heart disease.  Cancer screening Lung Cancer  Lung cancer screening is recommended for adults 47-87 years old who are at high risk for lung cancer because of a history of smoking.  A yearly low-dose CT scan of the lungs is recommended for people who: ? Currently smoke. ? Have quit within the past 15 years. ? Have at  least a 30-pack-year history of smoking. A pack year is smoking an average of one pack of cigarettes a day for 1 year.  Yearly screening should continue until it has been 15 years since you quit.  Yearly screening should stop if you develop a health problem that would prevent you from having lung cancer treatment.  Breast Cancer  Practice breast self-awareness. This means understanding how your breasts normally appear and feel.  It also means doing regular breast self-exams. Let your health care provider know about any changes, no matter how small.  If you are in your 20s or 30s, you should have a clinical breast exam (CBE) by a health care provider every 1-3 years as part of a regular health exam.  If you are 82 or older, have a CBE every year. Also consider having a breast X-ray (mammogram) every year.  If you have a family history of breast cancer, talk to your health care provider about genetic screening.  If you are at high risk for breast cancer, talk to your health care provider about having an MRI and a mammogram every year.  Breast cancer gene (BRCA) assessment is recommended for women who have family members with BRCA-related cancers. BRCA-related cancers include: ? Breast. ? Ovarian. ? Tubal. ? Peritoneal cancers.  Results of the assessment will determine the need for genetic counseling and BRCA1 and BRCA2 testing.  Cervical Cancer Your health care provider may recommend that you be screened regularly for cancer of the pelvic organs (ovaries, uterus, and vagina). This screening involves a pelvic examination, including checking for microscopic changes to the surface of your cervix (Pap test). You may be encouraged to have this screening done every 3 years, beginning at age 70.  For women ages 61-65, health care providers may recommend pelvic exams and Pap testing every 3 years, or they may recommend the Pap and pelvic exam, combined with testing for human papilloma virus  (HPV), every 5 years. Some types of HPV increase your risk of cervical cancer. Testing for HPV may also be done on women of any age with unclear Pap test results.  Other health care providers may not recommend any screening for nonpregnant women who are considered low risk for pelvic cancer and who do not have symptoms. Ask your health care provider if a screening pelvic exam is right for you.  If you have had past treatment for cervical cancer or a condition that could lead to cancer, you need Pap tests and screening for cancer for at least 20 years after your treatment. If Pap tests have been discontinued, your risk factors (such as having a new sexual partner) need to be reassessed to determine if screening should resume. Some women have medical problems that increase the chance of getting cervical cancer. In these cases, your health  care provider may recommend more frequent screening and Pap tests.  Colorectal Cancer  This type of cancer can be detected and often prevented.  Routine colorectal cancer screening usually begins at 75 years of age and continues through 75 years of age.  Your health care provider may recommend screening at an earlier age if you have risk factors for colon cancer.  Your health care provider may also recommend using home test kits to check for hidden blood in the stool.  A small camera at the end of a tube can be used to examine your colon directly (sigmoidoscopy or colonoscopy). This is done to check for the earliest forms of colorectal cancer.  Routine screening usually begins at age 91.  Direct examination of the colon should be repeated every 5-10 years through 75 years of age. However, you may need to be screened more often if early forms of precancerous polyps or small growths are found.  Skin Cancer  Check your skin from head to toe regularly.  Tell your health care provider about any new moles or changes in moles, especially if there is a change in a  mole's shape or color.  Also tell your health care provider if you have a mole that is larger than the size of a pencil eraser.  Always use sunscreen. Apply sunscreen liberally and repeatedly throughout the day.  Protect yourself by wearing long sleeves, pants, a wide-brimmed hat, and sunglasses whenever you are outside.  Heart disease, diabetes, and high blood pressure  High blood pressure causes heart disease and increases the risk of stroke. High blood pressure is more likely to develop in: ? People who have blood pressure in the high end of the normal range (130-139/85-89 mm Hg). ? People who are overweight or obese. ? People who are African American.  If you are 18-54 years of age, have your blood pressure checked every 3-5 years. If you are 61 years of age or older, have your blood pressure checked every year. You should have your blood pressure measured twice-once when you are at a hospital or clinic, and once when you are not at a hospital or clinic. Record the average of the two measurements. To check your blood pressure when you are not at a hospital or clinic, you can use: ? An automated blood pressure machine at a pharmacy. ? A home blood pressure monitor.  If you are between 16 years and 59 years old, ask your health care provider if you should take aspirin to prevent strokes.  Have regular diabetes screenings. This involves taking a blood sample to check your fasting blood sugar level. ? If you are at a normal weight and have a low risk for diabetes, have this test once every three years after 75 years of age. ? If you are overweight and have a high risk for diabetes, consider being tested at a younger age or more often. Preventing infection Hepatitis B  If you have a higher risk for hepatitis B, you should be screened for this virus. You are considered at high risk for hepatitis B if: ? You were born in a country where hepatitis B is common. Ask your health care provider  which countries are considered high risk. ? Your parents were born in a high-risk country, and you have not been immunized against hepatitis B (hepatitis B vaccine). ? You have HIV or AIDS. ? You use needles to inject street drugs. ? You live with someone who has hepatitis B. ?  You have had sex with someone who has hepatitis B. ? You get hemodialysis treatment. ? You take certain medicines for conditions, including cancer, organ transplantation, and autoimmune conditions.  Hepatitis C  Blood testing is recommended for: ? Everyone born from [birth year] through 1963. ? Anyone with known risk factors for hepatitis C.  Sexually transmitted infections (STIs)  You should be screened for sexually transmitted infections (STIs) including gonorrhea and chlamydia if: ? You are sexually active and are younger than 75 years of age. ? You are older than 75 years of age and your health care provider tells you that you are at risk for this type of infection. ? Your sexual activity has changed since you were last screened and you are at an increased risk for chlamydia or gonorrhea. Ask your health care provider if you are at risk.  If you do not have HIV, but are at risk, it may be recommended that you take a prescription medicine daily to prevent HIV infection. This is called pre-exposure prophylaxis (PrEP). You are considered at risk if: ? You are sexually active and do not regularly use condoms or know the HIV status of your partner(s). ? You take drugs by injection. ? You are sexually active with a partner who has HIV.  Talk with your health care provider about whether you are at high risk of being infected with HIV. If you choose to begin PrEP, you should first be tested for HIV. You should then be tested every 3 months for as long as you are taking PrEP. Pregnancy  If you are premenopausal and you may become pregnant, ask your health care provider about preconception counseling.  If you may become  pregnant, take 400 to 800 micrograms (mcg) of folic acid every day.  If you want to prevent pregnancy, talk to your health care provider about birth control (contraception). Osteoporosis and menopause  Osteoporosis is a disease in which the bones lose minerals and strength with aging. This can result in serious bone fractures. Your risk for osteoporosis can be identified using a bone density scan.  If you are 32 years of age or older, or if you are at risk for osteoporosis and fractures, ask your health care provider if you should be screened.  Ask your health care provider whether you should take a calcium or vitamin D supplement to lower your risk for osteoporosis.  Menopause may have certain physical symptoms and risks.  Hormone replacement therapy may reduce some of these symptoms and risks. Talk to your health care provider about whether hormone replacement therapy is right for you. Follow these instructions at home:  Schedule regular health, dental, and eye exams.  Stay current with your immunizations.  Do not use any tobacco products including cigarettes, chewing tobacco, or electronic cigarettes.  If you are pregnant, do not drink alcohol.  If you are breastfeeding, limit how much and how often you drink alcohol.  Limit alcohol intake to no more than 1 drink per day for nonpregnant women. One drink equals 12 ounces of beer, 5 ounces of Ravon Mortellaro, or 1 ounces of hard liquor.  Do not use street drugs.  Do not share needles.  Ask your health care provider for help if you need support or information about quitting drugs.  Tell your health care provider if you often feel depressed.  Tell your health care provider if you have ever been abused or do not feel safe at home. This information is not intended to replace advice  given to you by your health care provider. Make sure you discuss any questions you have with your health care provider. Document Released: 01/07/2011 Document  Revised: 11/30/2015 Document Reviewed: 03/28/2015 Elsevier Interactive Patient Education  [employer].

## 2018-03-19 NOTE — Assessment & Plan Note (Signed)
Positive [facility] then positive here in 2016 Negative in 2017 Will repeat testing today

## 2018-03-19 NOTE — Assessment & Plan Note (Signed)
Has hearing loss, no dizziness Sees ENT Q 6 months Takes maxzide daily cmp

## 2018-03-19 NOTE — Assessment & Plan Note (Signed)
Check lipid panel. Cmp, tsh Mildly elevated in past Not on medication Regular exercise and healthy diet encouraged

## 2018-03-20 ENCOUNTER — Other Ambulatory Visit: Payer: Self-pay

## 2018-03-20 LAB — HEPATITIS C ANTIBODY
Hepatitis C Ab: NONREACTIVE
SIGNAL TO CUT-OFF: 0.2 (ref <1.00)

## 2018-03-20 MED ORDER — LEVOTHYROXINE SODIUM 25 MCG PO TABS: 25.0000 ug | ORAL_TABLET | Freq: Every day | ORAL | 0 refills | Status: DC

## 2018-04-23 ENCOUNTER — Other Ambulatory Visit (INDEPENDENT_AMBULATORY_CARE_PROVIDER_SITE_OTHER): Payer: Medicare Other

## 2018-04-23 ENCOUNTER — Encounter: Payer: Self-pay | Admitting: Internal Medicine

## 2018-04-23 DIAGNOSIS — E039 Hypothyroidism, unspecified: Secondary | ICD-10-CM

## 2018-04-23 LAB — TSH: TSH: 1.27 u[IU]/mL (ref 0.35–4.50)

## 2018-05-04 DIAGNOSIS — H1859 Other hereditary corneal dystrophies: Secondary | ICD-10-CM | POA: Diagnosis not present

## 2018-05-04 DIAGNOSIS — H2513 Age-related nuclear cataract, bilateral: Secondary | ICD-10-CM | POA: Diagnosis not present

## 2018-05-04 DIAGNOSIS — H16223 Keratoconjunctivitis sicca, not specified as Sjogren's, bilateral: Secondary | ICD-10-CM | POA: Diagnosis not present

## 2018-07-10 ENCOUNTER — Other Ambulatory Visit: Payer: Self-pay | Admitting: Internal Medicine

## 2018-07-23 NOTE — Progress Notes (Signed)
Subjective:    Patient ID: Holly Richard, female    DOB: [birth year], 78 y.o.   MRN: [id number]  HPI The patient is here for an acute visit.   Left lower quadrant pain: This pain started in 2016 and is intermittent.  She thought it was related to her back pain.  She is chronic lower back pain that she takes diclofenac for.  She currently takes diclofenac every other day.  She has noticed that sometimes when she does something that flares up the back she will feel the left lower quadrant pain more.  She is unsure if the diclofenac helps or not.  She also has a history of a cervical cyst and worried about the possibility of an ovarian cancer.  She did have an ultrasound in June 2018 that showed that the cyst had resolved and her ovaries were normal.  She has discussed this with her gynecologist who had a low suspicion for a gynecological reason for the pain.  She does have a follow-up appointment in June.  She does feel the pain on a daily basis and it is mild-mostly just irritating.  She does have chronic constipation and she thinks that may aggravate it at times.  She does drink prune juice most days, but does not do consistently.  She has found that if she drinks prune juice and a couple coffee this helps with her constipation.   She is unsure of any other factors that improve or worsen the pain.   Medications and allergies reviewed with patient and updated if appropriate.  Patient Active Problem List   Diagnosis Date Noted  . Chronic LLQ pain 05/20/2016  . Lumbar stenosis 12/28/2014  . Hepatitis C antibody test positive - false positive 12/07/2014  . Left sided sciatica 11/04/2012  . Hypothyroidism 07/26/2010  . Hyperlipidemia 07/26/2010  . MIGRAINE HEADACHE 07/26/2010  . Meniere's disease 07/26/2010  . ARTHRITIS 07/26/2010  . ALLERGIC RHINITIS 07/05/2010  . DIVERTICULOSIS, COLON 07/05/2010  . Osteopenia 07/05/2010    Current Outpatient Medications on File Prior to Visit    Medication Sig Dispense Refill  . acetaminophen (TYLENOL) 500 MG tablet Take 500 mg by mouth every 6 (six) hours as needed for mild pain or moderate pain.    Marland Kitchen CALCIUM-VITAMIN D PO Take 1 tablet by mouth daily.     . Diclofenac Potassium 50 MG PACK Take 75 mg by mouth every other day.     . fluticasone (FLONASE) 50 MCG/ACT nasal spray Place 1 spray into both nostrils daily as needed for allergies or rhinitis.    Marland Kitchen levothyroxine (SYNTHROID, LEVOTHROID) 25 MCG tablet TAKE 1 TABLET BY MOUTH ONCE DAILY BEFORE BREAKFAST 90 tablet 0  . Lifitegrast (XIIDRA) 5 % SOLN Apply 1 drop to eye 2 (two) times daily before a meal.    . Multiple Vitamins-Minerals (QC WOMENS DAILY MULTIVITAMIN) TABS Take 1 tablet by mouth daily.     . Omega-3 Fatty Acids (FISH OIL TRIPLE STRENGTH) 1400 MG CAPS Take 3 tablets by mouth daily.     Vladimir Faster Glycol-Propyl Glycol (SYSTANE) 0.4-0.3 % SOLN Apply to eye as needed.    . triamterene-hydrochlorothiazide (MAXZIDE-25) 37.5-25 MG per tablet Take 1 tablet by mouth daily.      Marland Kitchen UNABLE TO FIND instaflex joint support     No current facility-administered medications on file prior to visit.     Past Medical History:  Diagnosis Date  . ALLERGIC RHINITIS   . Allergy   . ARTHRITIS   .  Arthritis   . DIVERTICULOSIS, COLON   . Hepatitis C antibody test positive 11/2014   negative RNA confirmation test  . HYPERLIPIDEMIA   . HYPOTHYROIDISM   . MENIERE'S DISEASE   . MIGRAINE HEADACHE   . OSTEOPENIA     Past Surgical History:  Procedure Laterality Date  . (R) Ear implants  10/2008   Hearing loss-coclear implant   . BUNIONECTOMY     X's - (R) 2/04 & (L) 3/04  . COLONOSCOPY    . LUMBAR LAMINECTOMY/DECOMPRESSION MICRODISCECTOMY Left 12/28/2014   Procedure: LUMBAR LAMINECTOMY/DECOMPRESSION MICRODISCECTOMY 2 LEVELS;  Surgeon: Jovita Gamma, MD;  Location: [facility];  Service: Neurosurgery;  Laterality: Left;  Left L34 laminotomy, foraminotomy and poss microdiskectomy with  left L45 laminotomy, foraminotomy, resection of synovial cyst and poss microdiskectomy  . TONSILLECTOMY  1954    Social History   Socioeconomic History  . Marital status: Married    Spouse name: Not on file  . Number of children: 2  . Years of education: Not on file  . Highest education level: Not on file  Occupational History  . Not on file  Social Needs  . Financial resource strain: Not hard at all  . Food insecurity:    Worry: Never true    Inability: Never true  . Transportation needs:    Medical: No    Non-medical: No  Tobacco Use  . Smoking status: Never Smoker  . Smokeless tobacco: Never Used  Substance and Sexual Activity  . Alcohol use: No    Alcohol/week: 0.0 standard drinks  . Drug use: No  . Sexual activity: Not Currently    Comment: 1ST intercourse- 20, married- 77 yrs   Lifestyle  . Physical activity:    Days per week: 3 days    Minutes per session: 60 min  . Stress: Not at all  Relationships  . Social connections:    Talks on phone: More than three times a week    Gets together: More than three times a week    Attends religious service: More than 4 times per year    Active member of club or organization: Yes    Attends meetings of clubs or organizations: More than 4 times per year    Relationship status: Married  Other Topics Concern  . Not on file  Social History Narrative   Married, lives with spouse-2 sons and 4 g-kids nearby.    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 Arthritis Mother   . Diabetes Mother   . Heart disease Mother   . Stroke Mother   . Colon cancer Father 22  . Stroke Father   . Breast cancer Sister   . Diabetes Sister   . Lung cancer Sister   . Diabetes Brother   . Prostate cancer Brother   . Colon cancer Brother   . Brain cancer Other        Nephew  . Colon polyps Neg Hx   . Rectal cancer Neg Hx   . Stomach cancer Neg Hx     Review of Systems    Constitutional: Negative for chills and fever.  Gastrointestinal: Positive for abdominal pain (LLQ) and constipation (takes prune juice). Negative for blood in stool.  Genitourinary: Negative for decreased urine volume.  Musculoskeletal: Positive for back pain (occ radiates to groin and anterior left leg).  Neurological: Negative for numbness.       Objective:  Vitals:   07/24/18 0941  BP: 118/74  Pulse: 86  Resp: 16  Temp: 98.9 F (37.2 C)  SpO2: 99%   BP Readings from Last 3 Encounters:  07/24/18 118/74  03/19/18 115/62  03/19/18 115/62   Wt Readings from Last 3 Encounters:  07/24/18 120 lb 3.2 oz (54.5 kg)  03/19/18 118 lb (53.5 kg)  03/19/18 118 lb (53.5 kg)   Body mass index is 21.98 kg/m.   Physical Exam Constitutional:      General: She is not in acute distress.    Appearance: Normal appearance. She is not ill-appearing.  HENT:     Head: Normocephalic and atraumatic.  Abdominal:     General: There is no distension.     Palpations: Abdomen is soft. There is no mass.     Tenderness: There is no abdominal tenderness. There is no guarding or rebound.     Hernia: No hernia is present.  Skin:    General: Skin is warm and dry.  Neurological:     Mental Status: She is alert.          Assessment & Plan:    See Problem List for Assessment and Plan of chronic medical problems.

## 2018-07-24 ENCOUNTER — Ambulatory Visit (INDEPENDENT_AMBULATORY_CARE_PROVIDER_SITE_OTHER): Payer: Medicare Other | Admitting: Internal Medicine

## 2018-07-24 ENCOUNTER — Encounter: Payer: Self-pay | Admitting: Internal Medicine

## 2018-07-24 VITALS — BP 118/74 | HR 86 | Temp 98.9°F | Resp 16 | Ht 62.0 in | Wt 120.2 lb

## 2018-07-24 DIAGNOSIS — R1032 Left lower quadrant pain: Secondary | ICD-10-CM | POA: Diagnosis not present

## 2018-07-24 NOTE — Assessment & Plan Note (Signed)
Chronic, intermittent pain for years-around 2016 Has had a gynecologist evaluate and likely not gynecological May be related to her chronic left lower back pain She does have chronic constipation and diverticulosis, which could also be contributing to the pain Advised for the next 2 weeks to drink prune juice, take supplemental fiber and drink of coffee daily to make sure her constipation is extremely well controlled.  She will observe to see if this improves the pain at all She will also pay attention to the days that she does not does not take the diclofenac if her pain is related or improved with that Consider referral back to GI-Dr. Henrene Pastor to see if this pain is related to her diverticulosis, which is very possible

## 2018-07-24 NOTE — Patient Instructions (Signed)
Take the prune juice, fiber and coffee daily for your constipation.    Take note if the pain is better or worse the days you take and do not take the diclofenac.    If your pain persists we can consider you seeing Dr Henrene Pastor again to see if he thinks this is related to your diverticulosis.     Diverticulosis  Diverticulosis is a condition that develops when small pouches (diverticula) form in the wall of the large intestine (colon). The colon is where water is absorbed and stool is formed. The pouches form when the inside layer of the colon pushes through weak spots in the outer layers of the colon. You may have a few pouches or many of them. What are the causes? The cause of this condition is not known. What increases the risk? The following factors may make you more likely to develop this condition:  Being older than age 41. Your risk for this condition increases with age. Diverticulosis is rare among people younger than age 13. By age 78, many people have it.  Eating a low-fiber diet.  Having frequent constipation.  Being overweight.  Not getting enough exercise.  Smoking.  Taking over-the-counter pain medicines, like aspirin and ibuprofen.  Having a family history of diverticulosis. What are the signs or symptoms? In most people, there are no symptoms of this condition. If you do have symptoms, they may include:  Bloating.  Cramps in the abdomen.  Constipation or diarrhea.  Pain in the lower left side of the abdomen. How is this diagnosed? This condition is most often diagnosed during an exam for other colon problems. Because diverticulosis usually has no symptoms, it often cannot be diagnosed independently. This condition may be diagnosed by:  Using a flexible scope to examine the colon (colonoscopy).  Taking an X-ray of the colon after dye has been put into the colon (barium enema).  Doing a CT scan. How is this treated? You may not need treatment for this  condition if you have never developed an infection related to diverticulosis. If you have had an infection before, treatment may include:  Eating a high-fiber diet. This may include eating more fruits, vegetables, and grains.  Taking a fiber supplement.  Taking a live bacteria supplement (probiotic).  Taking medicine to relax your colon.  Taking antibiotic medicines. Follow these instructions at home:  Drink 6-8 glasses of water or more each day to prevent constipation.  Try not to strain when you have a bowel movement.  If you have had an infection before: ? Eat more fiber as directed by your health care provider or your diet and nutrition specialist (dietitian). ? Take a fiber supplement or probiotic, if your health care provider approves.  Take over-the-counter and prescription medicines only as told by your health care provider.  If you were prescribed an antibiotic, take it as told by your health care provider. Do not stop taking the antibiotic even if you start to feel better.  Keep all follow-up visits as told by your health care provider. This is important. Contact a health care provider if:  You have pain in your abdomen.  You have bloating.  You have cramps.  You have not had a bowel movement in 3 days. Get help right away if:  Your pain gets worse.  Your bloating becomes very bad.  You have a fever or chills, and your symptoms suddenly get worse.  You vomit.  You have bowel movements that are bloody or  black.  You have bleeding from your rectum. Summary  Diverticulosis is a condition that develops when small pouches (diverticula) form in the wall of the large intestine (colon).  You may have a few pouches or many of them.  This condition is most often diagnosed during an exam for other colon problems.  If you have had an infection related to diverticulosis, treatment may include increasing the fiber in your diet, taking supplements, or taking  medicines. This information is not intended to replace advice given to you by your health care provider. Make sure you discuss any questions you have with your health care provider. Document Released: 03/21/2004 Document Revised: 05/13/2016 Document Reviewed: 05/13/2016 Elsevier Interactive Patient Education  2019 [employer].

## 2018-08-10 ENCOUNTER — Telehealth: Payer: Self-pay | Admitting: Internal Medicine

## 2018-08-10 NOTE — Telephone Encounter (Signed)
Copied from [place] [id number]. Topic: Quick Communication - See Telephone Encounter >> Aug 10, 2018  2:33 PM Ivar Drape wrote: CRM for notification. See Telephone encounter for: 08/10/18. Patient was told to let the doctor know about her situation after doing what the provider suggested.  Patient is a little better, but it has not gone away. She would like to know what the next step will be.

## 2018-08-10 NOTE — Telephone Encounter (Signed)
Spoke with Holly Richard and she stated with taking fiber and drinking prune juice her pain has gotten better in her left side. Still having some pain. I told Holly Richard per Dr. Quay Burow note that if she is still having pain to contact Dr. Blanch Media office to make sure it is not her diverticulosis. Holly Richard understood.

## 2018-08-31 ENCOUNTER — Encounter: Payer: Self-pay | Admitting: Internal Medicine

## 2018-08-31 ENCOUNTER — Ambulatory Visit (INDEPENDENT_AMBULATORY_CARE_PROVIDER_SITE_OTHER): Payer: Medicare Other | Admitting: Internal Medicine

## 2018-08-31 VITALS — BP 108/62 | HR 66 | Ht 62.0 in | Wt 121.0 lb

## 2018-08-31 DIAGNOSIS — R1032 Left lower quadrant pain: Secondary | ICD-10-CM

## 2018-08-31 DIAGNOSIS — K5900 Constipation, unspecified: Secondary | ICD-10-CM | POA: Diagnosis not present

## 2018-08-31 NOTE — Progress Notes (Signed)
HISTORY OF PRESENT ILLNESS:  Holly Richard is a 78 y.o. female who presents today for evaluation of chronic left lower quadrant pain.  The patient has had the discomfort for 3 or 4 years.  She tells me that she had negative GYN evaluation in 2018.  The discomfort is often associated with left back pain.  She does tend to be chronically constipated.  More recently she introduced fiber and prune juice with improved bowel habits as well as lessening of the left lower quadrant discomfort.  Patient has had colonoscopies in 2007, 2012 (both of which were negative for neoplasia) and most recently June 2017.  Father with a history of colon cancer greater than age 27.  Her most recent examination revealed a diminutive tubular adenoma and sigmoid diverticulosis with stenosis and melanosis coli.  Review of blood work from September 2019 finds normal CBC with hemoglobin 13.8.  Patient denies rectal bleeding, weight loss, or obstructive symptoms.  REVIEW OF SYSTEMS:  All non-GI ROS negative unless otherwise stated in the HPI except for back pain, hearing problems  Past Medical History:  Diagnosis Date  . ALLERGIC RHINITIS   . Allergy   . ARTHRITIS   . Arthritis   . DIVERTICULOSIS, COLON   . Hepatitis C antibody test positive 11/2014   negative RNA confirmation test  . HYPERLIPIDEMIA   . HYPOTHYROIDISM   . MENIERE'S DISEASE   . MIGRAINE HEADACHE   . OSTEOPENIA     Past Surgical History:  Procedure Laterality Date  . (R) Ear implants  10/2008   Hearing loss-coclear implant   . BUNIONECTOMY     X's - (R) 2/04 & (L) 3/04  . COLONOSCOPY    . LUMBAR LAMINECTOMY/DECOMPRESSION MICRODISCECTOMY Left 12/28/2014   Procedure: LUMBAR LAMINECTOMY/DECOMPRESSION MICRODISCECTOMY 2 LEVELS;  Surgeon: Jovita Gamma, MD;  Location: [facility];  Service: Neurosurgery;  Laterality: Left;  Left L34 laminotomy, foraminotomy and poss microdiskectomy with left L45 laminotomy, foraminotomy, resection of synovial cyst and  poss microdiskectomy  . TONSILLECTOMY  1954    Social History TAYTEM GHATTAS  reports that she has never smoked. She has never used smokeless tobacco. She reports that she does not drink alcohol or use drugs.  family history includes Arthritis in her mother; Brain cancer in an other family member; Breast cancer in her sister; Colon cancer in her brother; Colon cancer (age of onset: 11) in her father; Diabetes in her brother, mother, and sister; Heart disease in her mother; Lung cancer in her sister; Prostate cancer in her brother; Stroke in her father and mother.  No Known Allergies     PHYSICAL EXAMINATION: Vital signs: BP 108/62   Pulse 66   Ht 5\' 2"  (1.575 m)   Wt 121 lb (54.9 kg)   BMI 22.13 kg/m   Constitutional: generally well-appearing, no acute distress Psychiatric: alert and oriented x3, cooperative Eyes: extraocular movements intact, anicteric, conjunctiva pink Mouth: oral pharynx moist, no lesions Neck: supple no lymphadenopathy Cardiovascular: heart regular rate and rhythm, no murmur Lungs: clear to auscultation bilaterally Abdomen: soft, nontender, nondistended, no obvious ascites, no peritoneal signs, normal bowel sounds, no organomegaly Rectal: Omitted Extremities: no clubbing, cyanosis, or lower extremity edema bilaterally Skin: no lesions on visible extremities Neuro: No focal deficits.  Cranial nerves intact  ASSESSMENT:  1.  Chronic left lower quadrant pain without alarm features.  Associated with constipation.  Known sigmoid stenosis the region of diverticular disease.  Discomfort improved with improved bowel habits 2.  Functional constipation  3.  Multiple colonoscopies.  Most recent examination 2017 with diminutive colon polyp and sigmoid stenosis as noted  PLAN:  1.  Continue fiber 2.  Continue prune juice 3.  Reassurance 4.  Surveillance colonoscopy around 2022 5.  If pain were to return or worsen despite adequate bowel movements then consider  advanced imaging.  I do not think this is necessary at this time.

## 2018-08-31 NOTE — Patient Instructions (Signed)
Please follow up as needed 

## 2018-09-01 DIAGNOSIS — H903 Sensorineural hearing loss, bilateral: Secondary | ICD-10-CM | POA: Diagnosis not present

## 2018-09-01 DIAGNOSIS — H8101 Meniere's disease, right ear: Secondary | ICD-10-CM | POA: Diagnosis not present

## 2018-09-01 DIAGNOSIS — H838X3 Other specified diseases of inner ear, bilateral: Secondary | ICD-10-CM | POA: Diagnosis not present

## 2018-10-04 ENCOUNTER — Other Ambulatory Visit: Payer: Self-pay | Admitting: Internal Medicine

## 2018-11-20 ENCOUNTER — Other Ambulatory Visit: Payer: Self-pay | Admitting: Obstetrics & Gynecology

## 2018-11-20 DIAGNOSIS — Z1231 Encounter for screening mammogram for malignant neoplasm of breast: Secondary | ICD-10-CM

## 2018-12-08 DIAGNOSIS — M5440 Lumbago with sciatica, unspecified side: Secondary | ICD-10-CM | POA: Diagnosis not present

## 2018-12-08 DIAGNOSIS — M4316 Spondylolisthesis, lumbar region: Secondary | ICD-10-CM | POA: Diagnosis not present

## 2018-12-08 DIAGNOSIS — M5136 Other intervertebral disc degeneration, lumbar region: Secondary | ICD-10-CM | POA: Diagnosis not present

## 2018-12-08 DIAGNOSIS — M5126 Other intervertebral disc displacement, lumbar region: Secondary | ICD-10-CM | POA: Diagnosis not present

## 2018-12-08 DIAGNOSIS — M5416 Radiculopathy, lumbar region: Secondary | ICD-10-CM | POA: Diagnosis not present

## 2018-12-17 ENCOUNTER — Encounter: Payer: Medicare Other | Admitting: Obstetrics & Gynecology

## 2018-12-18 ENCOUNTER — Other Ambulatory Visit: Payer: Self-pay

## 2018-12-21 ENCOUNTER — Other Ambulatory Visit: Payer: Self-pay

## 2018-12-21 ENCOUNTER — Encounter: Payer: Self-pay | Admitting: Obstetrics & Gynecology

## 2018-12-21 ENCOUNTER — Ambulatory Visit (INDEPENDENT_AMBULATORY_CARE_PROVIDER_SITE_OTHER): Payer: Medicare Other | Admitting: Obstetrics & Gynecology

## 2018-12-21 VITALS — BP 110/68 | Ht 61.5 in | Wt 120.0 lb

## 2018-12-21 DIAGNOSIS — Z01419 Encounter for gynecological examination (general) (routine) without abnormal findings: Secondary | ICD-10-CM | POA: Diagnosis not present

## 2018-12-21 DIAGNOSIS — Z780 Asymptomatic menopausal state: Secondary | ICD-10-CM

## 2018-12-21 DIAGNOSIS — M8589 Other specified disorders of bone density and structure, multiple sites: Secondary | ICD-10-CM

## 2018-12-21 DIAGNOSIS — Z124 Encounter for screening for malignant neoplasm of cervix: Secondary | ICD-10-CM

## 2018-12-21 NOTE — Progress Notes (Signed)
Holly Richard Apr 26, 1941 [id number]   History:    78 y.o. G3P2A1L2 Married.  GM of 4.  RP:  Established patient presenting for annual gyn exam   HPI: Menopause, well on no hormone replacement therapy.  No postmenopausal bleeding.  No pelvic pain.  Abstinent.  Urine and bowel movements normal.  Breast normal.  Body mass index 22.31.  Physically active, working in the yard.  Health labs with family physician.  Colonoscopy in 2017.  Past medical history,surgical history, family history and social history were all reviewed and documented in the EPIC chart.  Gynecologic History No LMP recorded. Patient is postmenopausal. Contraception: post menopausal status Last Pap: 2017. Results were: normal Last mammogram: 12/2017. Results were: negative Bone Density: 01/2017 Osteopenia.  Will repeat BD here in 01/2019. Colonoscopy: 2017  Obstetric History OB History  Gravida Para Term Preterm AB Living  3 2     1 2   SAB TAB Ectopic Multiple Live Births  1            # Outcome Date GA Lbr Len/2nd Weight Sex Delivery Anes PTL Lv  3 SAB           2 Para           1 Para              ROS: A ROS was performed and pertinent positives and negatives are included in the history.  GENERAL: No fevers or chills. HEENT: No change in vision, no earache, sore throat or sinus congestion. NECK: No pain or stiffness. CARDIOVASCULAR: No chest pain or pressure. No palpitations. PULMONARY: No shortness of breath, cough or wheeze. GASTROINTESTINAL: No abdominal pain, nausea, vomiting or diarrhea, melena or bright red blood per rectum. GENITOURINARY: No urinary frequency, urgency, hesitancy or dysuria. MUSCULOSKELETAL: No joint or muscle pain, no back pain, no recent trauma. DERMATOLOGIC: No rash, no itching, no lesions. ENDOCRINE: No polyuria, polydipsia, no heat or cold intolerance. No recent change in weight. HEMATOLOGICAL: No anemia or easy bruising or bleeding. NEUROLOGIC: No headache, seizures, numbness,  tingling or weakness. PSYCHIATRIC: No depression, no loss of interest in normal activity or change in sleep pattern.     Exam:   BP 110/68   Ht 5' 1.5" (1.562 m)   Wt 120 lb (54.4 kg)   BMI 22.31 kg/m   Body mass index is 22.31 kg/m.  General appearance : Well developed well nourished female. No acute distress HEENT: Eyes: no retinal hemorrhage or exudates,  Neck supple, trachea midline, no carotid bruits, no thyroidmegaly Lungs: Clear to auscultation, no rhonchi or wheezes, or rib retractions  Heart: Regular rate and rhythm, no murmurs or gallops Breast:Examined in sitting and supine position were symmetrical in appearance, no palpable masses or tenderness,  no skin retraction, no nipple inversion, no nipple discharge, no skin discoloration, no axillary or supraclavicular lymphadenopathy Abdomen: no palpable masses or tenderness, no rebound or guarding Extremities: no edema or skin discoloration or tenderness  Pelvic: Vulva: Normal             Vagina: No gross lesions or discharge  Cervix: No gross lesions or discharge.  Pap reflex done.  Uterus  AV, normal size, shape and consistency, non-tender and mobile  Adnexa  Without masses or tenderness  Anus: Normal   Assessment/Plan:  78 y.o. female for annual exam   1. Encounter for routine gynecological examination with Papanicolaou smear of cervix Normal gynecologic exam in menopause.  Pap reflex done.  Breast  exam normal.  Screening mammogram June 2019 was negative.  Colonoscopy 2017.  Health labs with family physician.  Good body mass index at 22.31.  Continue with fitness and healthy nutrition.  2. Menopause present Well on no hormone replacement therapy.  No postmenopausal bleeding.  3. Osteopenia of multiple sites We will follow-up here in July for repeat bone density.  Recommend vitamin D supplements, calcium intake of 1200 mg daily and regular weightbearing physical activities. - DG Bone Density; Future  Princess Bruins MD, 10:16 AM 12/21/2018

## 2018-12-21 NOTE — Patient Instructions (Signed)
1. Encounter for routine gynecological examination with Papanicolaou smear of cervix Normal gynecologic exam in menopause.  Pap reflex done.  Breast exam normal.  Screening mammogram June 2019 was negative.  Colonoscopy 2017.  Health labs with family physician.  Good body mass index at 22.31.  Continue with fitness and healthy nutrition.  2. Menopause present Well on no hormone replacement therapy.  No postmenopausal bleeding.  3. Osteopenia of multiple sites We will follow-up here in July for repeat bone density.  Recommend vitamin D supplements, calcium intake of 1200 mg daily and regular weightbearing physical activities. - DG Bone Density; Future  Holly Richard, it was a pleasure seeing you today!  I will inform you of your results as soon as they are available.

## 2018-12-21 NOTE — Addendum Note (Signed)
Addended by: Thurnell Garbe A on: 12/21/2018 10:52 AM   Modules accepted: Orders

## 2018-12-22 LAB — PAP IG W/ RFLX HPV ASCU

## 2019-01-04 ENCOUNTER — Other Ambulatory Visit: Payer: Self-pay | Admitting: Internal Medicine

## 2019-01-04 DIAGNOSIS — H16223 Keratoconjunctivitis sicca, not specified as Sjogren's, bilateral: Secondary | ICD-10-CM | POA: Diagnosis not present

## 2019-01-13 ENCOUNTER — Other Ambulatory Visit: Payer: Self-pay

## 2019-01-13 ENCOUNTER — Ambulatory Visit
Admission: RE | Admit: 2019-01-13 | Discharge: 2019-01-13 | Disposition: A | Discharge status: Home / Self Care | Payer: Medicare Other | Source: Ambulatory Visit | Attending: Obstetrics & Gynecology | Admitting: Obstetrics & Gynecology

## 2019-01-13 DIAGNOSIS — Z1231 Encounter for screening mammogram for malignant neoplasm of breast: Secondary | ICD-10-CM

## 2019-01-14 ENCOUNTER — Other Ambulatory Visit: Payer: Self-pay | Admitting: Obstetrics & Gynecology

## 2019-01-14 DIAGNOSIS — R921 Mammographic calcification found on diagnostic imaging of breast: Secondary | ICD-10-CM

## 2019-01-19 ENCOUNTER — Other Ambulatory Visit: Payer: Self-pay | Admitting: Obstetrics & Gynecology

## 2019-01-19 ENCOUNTER — Other Ambulatory Visit: Payer: Self-pay

## 2019-01-19 ENCOUNTER — Ambulatory Visit
Admission: RE | Admit: 2019-01-19 | Discharge: 2019-01-19 | Disposition: A | Discharge status: Home / Self Care | Payer: Medicare Other | Source: Ambulatory Visit | Attending: Obstetrics & Gynecology | Admitting: Obstetrics & Gynecology

## 2019-01-19 DIAGNOSIS — R921 Mammographic calcification found on diagnostic imaging of breast: Secondary | ICD-10-CM | POA: Diagnosis not present

## 2019-01-20 ENCOUNTER — Other Ambulatory Visit: Payer: Self-pay

## 2019-01-21 ENCOUNTER — Ambulatory Visit (INDEPENDENT_AMBULATORY_CARE_PROVIDER_SITE_OTHER): Payer: Medicare Other

## 2019-01-21 DIAGNOSIS — Z780 Asymptomatic menopausal state: Secondary | ICD-10-CM

## 2019-01-21 DIAGNOSIS — M8589 Other specified disorders of bone density and structure, multiple sites: Secondary | ICD-10-CM

## 2019-01-22 ENCOUNTER — Other Ambulatory Visit: Payer: Self-pay | Admitting: Obstetrics & Gynecology

## 2019-01-22 DIAGNOSIS — M8589 Other specified disorders of bone density and structure, multiple sites: Secondary | ICD-10-CM

## 2019-01-22 DIAGNOSIS — Z780 Asymptomatic menopausal state: Secondary | ICD-10-CM

## 2019-03-23 NOTE — Progress Notes (Signed)
Subjective:    Patient ID: Holly Richard, female    DOB: 1941-06-05, 78 y.o.   MRN: [id number]  HPI The patient is here for follow up.  She is exercising regularly - walking, yardwork.    Hypothyroidism:  She is taking her medication daily.  She denies any recent changes in energy or weight that are unexplained.   Hyperlipidemia: She is controlling her cholesterol with lifestyle. She is compliant with a low fat/cholesterol diet.  Meniere's disease:  She sees ENT Q 6 mo.  She takes maxzide daily.  Her symptoms have been well controlled.  Osteopenia:  She is walking regularly.  Bone density is up-to-date.  She is taking calcium and vitamin D daily.  Chronic lower back pain: She continues to deal with chronic lower back pain.  She takes the diclofenac as needed.  LLQ pain: She continues to have the intermittent left lower quadrant abdominal pain.  She did see GI and he stated it was related to narrowing of colon.  She is taking fiber and a stool softener fairly regularly.  If she does forget she will get a little constipated and the pain will increase.   Medications and allergies reviewed with patient and updated if appropriate.  Patient Active Problem List   Diagnosis Date Noted  . LLQ abdominal pain 07/24/2018  . Chronic LLQ pain 05/20/2016  . Lumbar stenosis 12/28/2014  . Hepatitis C antibody test positive - false positive 12/07/2014  . Left sided sciatica 11/04/2012  . Hypothyroidism 07/26/2010  . Hyperlipidemia 07/26/2010  . MIGRAINE HEADACHE 07/26/2010  . Meniere's disease 07/26/2010  . ARTHRITIS 07/26/2010  . ALLERGIC RHINITIS 07/05/2010  . DIVERTICULOSIS, COLON 07/05/2010  . Osteopenia 07/05/2010    Current Outpatient Medications on File Prior to Visit  Medication Sig Dispense Refill  . acetaminophen (TYLENOL) 500 MG tablet Take 500 mg by mouth every 6 (six) hours as needed for mild pain or moderate pain.    Marland Kitchen CALCIUM-VITAMIN D PO Take 1 tablet by mouth daily.      . Diclofenac Potassium 50 MG PACK Take 75 mg by mouth every other day.     . fluticasone (FLONASE) 50 MCG/ACT nasal spray Place 1 spray into both nostrils daily as needed for allergies or rhinitis.    . Ginkgo Biloba (GINKOBA PO) Take 1 tablet by mouth daily.    Marland Kitchen levothyroxine (SYNTHROID) 25 MCG tablet TAKE 1 TABLET BY MOUTH ONCE DAILY BEFORE BREAKFAST 90 tablet 0  . Multiple Vitamins-Minerals (QC WOMENS DAILY MULTIVITAMIN) TABS Take 1 tablet by mouth daily.     . Omega-3 Fatty Acids (FISH OIL TRIPLE STRENGTH) 1400 MG CAPS Take 3 tablets by mouth daily.     Vladimir Faster Glycol-Propyl Glycol (SYSTANE) 0.4-0.3 % SOLN Apply to eye as needed.    . triamterene-hydrochlorothiazide (MAXZIDE-25) 37.5-25 MG per tablet Take 1 tablet by mouth daily.      Marland Kitchen UNABLE TO FIND instaflex joint support     No current facility-administered medications on file prior to visit.     Past Medical History:  Diagnosis Date  . ALLERGIC RHINITIS   . Allergy   . ARTHRITIS   . Arthritis   . DIVERTICULOSIS, COLON   . Hepatitis C antibody test positive 11/2014   negative RNA confirmation test  . HYPERLIPIDEMIA   . HYPOTHYROIDISM   . MENIERE'S DISEASE   . MIGRAINE HEADACHE   . OSTEOPENIA     Past Surgical History:  Procedure Laterality Date  . (R)  Ear implants  10/2008   Hearing loss-coclear implant   . BUNIONECTOMY     X's - (R) 2/04 & (L) 3/04  . COLONOSCOPY    . LUMBAR LAMINECTOMY/DECOMPRESSION MICRODISCECTOMY Left 12/28/2014   Procedure: LUMBAR LAMINECTOMY/DECOMPRESSION MICRODISCECTOMY 2 LEVELS;  Surgeon: Jovita Gamma, MD;  Location: [facility];  Service: Neurosurgery;  Laterality: Left;  Left L34 laminotomy, foraminotomy and poss microdiskectomy with left L45 laminotomy, foraminotomy, resection of synovial cyst and poss microdiskectomy  . TONSILLECTOMY  1954    Social History   Socioeconomic History  . Marital status: Married    Spouse name: Not on file  . Number of children: 2  . Years of  education: Not on file  . Highest education level: Not on file  Occupational History  . Not on file  Social Needs  . Financial resource strain: Not hard at all  . Food insecurity    Worry: Never true    Inability: Never true  . Transportation needs    Medical: No    Non-medical: No  Tobacco Use  . Smoking status: Never Smoker  . Smokeless tobacco: Never Used  Substance and Sexual Activity  . Alcohol use: No    Alcohol/week: 0.0 standard drinks  . Drug use: No  . Sexual activity: Not Currently    Comment: 1ST intercourse- 20, married- 51 yrs   Lifestyle  . Physical activity    Days per week: 3 days    Minutes per session: 60 min  . Stress: Not at all  Relationships  . Social connections    Talks on phone: More than three times a week    Gets together: More than three times a week    Attends religious service: More than 4 times per year    Active member of club or organization: Yes    Attends meetings of clubs or organizations: More than 4 times per year    Relationship status: Married  Other Topics Concern  . Not on file  Social History Narrative   Married, lives with spouse-2 sons and 4 g-kids nearby.    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 Arthritis Mother   . Diabetes Mother   . Heart disease Mother   . Stroke Mother   . Colon cancer Father 48  . Stroke Father   . Breast cancer Sister   . Diabetes Sister   . Lung cancer Sister   . Diabetes Brother   . Prostate cancer Brother   . Colon cancer Brother   . Brain cancer Other        Nephew  . Colon polyps Neg Hx   . Rectal cancer Neg Hx   . Stomach cancer Neg Hx     Review of Systems  Constitutional: Negative for chills, fatigue and fever.  Eyes: Negative for visual disturbance.  Respiratory: Negative for cough, shortness of breath and wheezing.   Cardiovascular: Negative for chest pain, palpitations and leg swelling.  Gastrointestinal:  Positive for abdominal pain (LLQ occ) and constipation. Negative for nausea.       Occ gerd  Musculoskeletal: Positive for back pain.  Neurological: Negative for numbness and headaches.  Psychiatric/Behavioral: Negative for dysphoric mood. The patient is not nervous/anxious.        Objective:   Vitals:   03/24/19 0838  BP: 116/68  Pulse: 74  Resp: 16  Temp: 98.4 F (36.9 C)  SpO2: 99%  BP Readings from Last 3 Encounters:  03/24/19 116/68  03/24/19 116/68  12/21/18 110/68   Wt Readings from Last 3 Encounters:  03/24/19 121 lb (54.9 kg)  03/24/19 121 lb (54.9 kg)  12/21/18 120 lb (54.4 kg)   Body mass index is 22.13 kg/m.   Physical Exam    Constitutional: Appears well-developed and well-nourished. No distress.  HENT:  Head: Normocephalic and atraumatic.  Neck: Neck supple. No tracheal deviation present. No thyromegaly present.  No cervical lymphadenopathy Cardiovascular: Normal rate, regular rhythm and normal heart sounds.   No murmur heard. No carotid bruit .  No edema Pulmonary/Chest: Effort normal and breath sounds normal. No respiratory distress. No has no wheezes. No rales. Abdomen: Soft, nontender, nondistended Skin: Skin is warm and dry. Not diaphoretic.  Psychiatric: Normal mood and affect. Behavior is normal.      Assessment & Plan:    See Problem List for Assessment and Plan of chronic medical problems.

## 2019-03-23 NOTE — Progress Notes (Addendum)
Subjective:   Holly Richard is a 78 y.o. female who presents for Medicare Annual (Subsequent) preventive examination.  Review of Systems:   Cardiac Risk Factors include: advanced age (>8men, >24 women);hypertension Sleep patterns: gets up 1-2 times nightly to void and sleeps 4-5 hours nightly. Patient reports insomnia issues, discussed recommended sleep tips.   Home Safety/Smoke Alarms: Feels safe in home. Smoke alarms in place.  Living environment; residence and Firearm Safety: 1-story house/ trailer. Seat Belt Safety/Bike Helmet: Wears seat belt.     Objective:     Vitals: BP 116/68   Pulse 74   Temp 98.4 F (36.9 C)   Resp 17   Ht 5\' 2"  (1.575 m)   Wt 121 lb (54.9 kg)   SpO2 98%   BMI 22.13 kg/m   Body mass index is 22.13 kg/m.  Advanced Directives 03/24/2019 03/19/2018 03/18/2017 01/02/2016 12/19/2015 04/21/2015 03/29/2015  Does Patient Have a Medical Advance Directive? No;Yes Yes Yes Yes Yes No Yes  Type of Paramedic of [place];Living will [place];Living will [place];Living will - [place];Living will [place];Living will [place];Living will  Does patient want to make changes to medical advance directive? - - - - - - No - Patient declined  Copy of [place] in Chart? No - copy requested No - copy requested No - copy requested No - copy requested - - No - copy requested    Tobacco Social History   Tobacco Use  Smoking Status Never Smoker  Smokeless Tobacco Never Used     Counseling given: Not Answered  Past Medical History:  Diagnosis Date  . ALLERGIC RHINITIS   . Allergy   . ARTHRITIS   . Arthritis   . DIVERTICULOSIS, COLON   . Hepatitis C antibody test positive 11/2014   negative RNA confirmation test  . HYPERLIPIDEMIA   . HYPOTHYROIDISM   . MENIERE'S DISEASE   . MIGRAINE HEADACHE   . OSTEOPENIA    Past  Surgical History:  Procedure Laterality Date  . (R) Ear implants  10/2008   Hearing loss-coclear implant   . BUNIONECTOMY     X's - (R) 2/04 & (L) 3/04  . COLONOSCOPY    . LUMBAR LAMINECTOMY/DECOMPRESSION MICRODISCECTOMY Left 12/28/2014   Procedure: LUMBAR LAMINECTOMY/DECOMPRESSION MICRODISCECTOMY 2 LEVELS;  Surgeon: Jovita Gamma, MD;  Location: [facility];  Service: Neurosurgery;  Laterality: Left;  Left L34 laminotomy, foraminotomy and poss microdiskectomy with left L45 laminotomy, foraminotomy, resection of synovial cyst and poss microdiskectomy  . TONSILLECTOMY  1954   Family History  Problem Relation Age of Onset  . Arthritis Mother   . Diabetes Mother   . Heart disease Mother   . Stroke Mother   . Colon cancer Father 66  . Stroke Father   . Breast cancer Sister   . Diabetes Sister   . Lung cancer Sister   . Diabetes Brother   . Prostate cancer Brother   . Colon cancer Brother   . Brain cancer Other        Nephew  . Colon polyps Neg Hx   . Rectal cancer Neg Hx   . Stomach cancer Neg Hx    Social History   Socioeconomic History  . Marital status: Married    Spouse name: Not on file  . Number of children: 2  . Years of education: Not on file  . Highest education level: Not  on file  Occupational History  . Not on file  Social Needs  . Financial resource strain: Not hard at all  . Food insecurity    Worry: Never true    Inability: Never true  . Transportation needs    Medical: No    Non-medical: No  Tobacco Use  . Smoking status: Never Smoker  . Smokeless tobacco: Never Used  Substance and Sexual Activity  . Alcohol use: No    Alcohol/week: 0.0 standard drinks  . Drug use: No  . Sexual activity: Not Currently  Lifestyle  . Physical activity    Days per week: 3 days    Minutes per session: 60 min  . Stress: Not at all  Relationships  . Social connections    Talks on phone: More than three times a week    Gets together: More than three times a week     Attends religious service: More than 4 times per year    Active member of club or organization: Yes    Attends meetings of clubs or organizations: More than 4 times per year    Relationship status: Married  Other Topics Concern  . Not on file  Social History Narrative   Married, lives with spouse-2 sons and 4 g-kids nearby.    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 acetaminophen (TYLENOL) 500 MG tablet Take 500 mg by mouth every 6 (six) hours as needed for mild pain or moderate pain.  Marland Kitchen CALCIUM-VITAMIN D PO Take 1 tablet by mouth daily.   . Diclofenac Potassium 50 MG PACK Take 75 mg by mouth every other day.   . fluticasone (FLONASE) 50 MCG/ACT nasal spray Place 1 spray into both nostrils daily as needed for allergies or rhinitis.  . Ginkgo Biloba (GINKOBA PO) Take 1 tablet by mouth daily.  Marland Kitchen levothyroxine (SYNTHROID) 25 MCG tablet TAKE 1 TABLET BY MOUTH ONCE DAILY BEFORE BREAKFAST  . Multiple Vitamins-Minerals (QC WOMENS DAILY MULTIVITAMIN) TABS Take 1 tablet by mouth daily.   . Omega-3 Fatty Acids (FISH OIL TRIPLE STRENGTH) 1400 MG CAPS Take 3 tablets by mouth daily.   Vladimir Faster Glycol-Propyl Glycol (SYSTANE) 0.4-0.3 % SOLN Apply to eye as needed.  . triamterene-hydrochlorothiazide (MAXZIDE-25) 37.5-25 MG per tablet Take 1 tablet by mouth daily.    Marland Kitchen UNABLE TO FIND instaflex joint support  . [DISCONTINUED] Lifitegrast (XIIDRA) 5 % SOLN Apply 1 drop to eye 2 (two) times daily before a meal.   No facility-administered encounter medications on file as of 03/24/2019.     Activities of Daily Living In your present state of health, do you have any difficulty performing the following activities: 03/24/2019  Hearing? N  Vision? N  Difficulty concentrating or making decisions? N  Walking or climbing stairs? N  Dressing or bathing? N  Doing errands, shopping? N  Preparing Food and eating ? N  Using the  Toilet? N  In the past six months, have you accidently leaked urine? N  Do you have problems with loss of bowel control? N  Managing your Medications? N  Managing your Finances? N  Housekeeping or managing your Housekeeping? N  Some recent data might be hidden    Patient Care Team: Binnie Rail, MD as PCP - General (Internal Medicine) Leta Baptist, MD (Otolaryngology) Jovita Gamma, MD (Neurosurgery) Suella Broad, MD (Physical Medicine and Rehabilitation) Princess Bruins, MD as Consulting Physician (Obstetrics and Gynecology)  Assessment:   This is a routine wellness examination for [place]. Physical assessment deferred to PCP.  Exercise Activities and Dietary recommendations Current Exercise Habits: Home exercise routine, Type of exercise: walking, Time (Minutes): 40, Frequency (Times/Week): 5, Weekly Exercise (Minutes/Week): 200, Intensity: Mild, Exercise limited by: orthopedic condition(s)  Diet (meal preparation, eat out, water intake, caffeinated beverages, dairy products, fruits and vegetables): in general, a "healthy" diet  , well balanced. eats a variety of fruits and vegetables daily, limits salt, fat/cholesterol, sugar,carbohydrates,caffeine, drinks 6-8 glasses of water daily.  Goals    . Patient Stated     Exercise more by doing a class at church, eat healthier by increasing fruits and vegetables, continue to travel, enjoy church, and enjoy life.    . Stay as healthy and as independent as possible     Continue to eat healthy and exercise, enjoy life, family and worship God       Fall Risk Fall Risk  03/24/2019 12/21/2018 03/19/2018 03/18/2017 02/28/2016  Falls in the past year? 0 0 No No No  Number falls in past yr: 0 0 - - -  Injury with Fall? - 0 - - -  Risk for fall due to : Impaired balance/gait - - - -   Is the patient's home free of loose throw rugs in walkways, pet beds, electrical cords, etc?   yes      Grab bars in the bathroom? yes      Handrails on  the stairs?   yes      Adequate lighting?   yes  Depression Screen PHQ 2/9 Scores 03/24/2019 03/19/2018 03/18/2017 02/28/2016  PHQ - 2 Score 0 0 0 0  PHQ- 9 Score - - 2 -     Cognitive Function MMSE - Mini Mental State Exam 03/19/2018 03/18/2017  Orientation to time 5 5  Orientation to Place 5 5  Registration 3 3  Attention/ Calculation 5 5  Recall 3 3  Language- name 2 objects 2 2  Language- repeat 1 1  Language- follow 3 step command 3 3  Language- read & follow direction 1 1  Write a sentence 1 1  Copy design 1 1  Total score 30 30        Immunization History  Administered Date(s) Administered  . Fluad Quad(high Dose 65+) 03/24/2019  . Hep A / Hep B 02/28/2016, 06/11/2016  . Hepatitis B, adult 04/01/2016  . Influenza Split 04/23/2011, 04/07/2012, 03/29/2013  . Influenza, High Dose Seasonal PF 03/18/2017, 03/19/2018  . Influenza,inj,Quad PF,6+ Mos 02/28/2016  . Pneumococcal Conjugate-13 02/27/2015  . Pneumococcal Polysaccharide-23 07/10/2004, 09/05/2009  . Td 06/19/2003, 11/08/2013  . Zoster 07/08/2005   Screening Tests Health Maintenance  Topic Date Due  . COLONOSCOPY  01/01/2021  . MAMMOGRAM  01/18/2021  . DEXA SCAN  01/21/2022  . TETANUS/TDAP  11/09/2023  . INFLUENZA VACCINE  Completed  . PNA vac Low Risk Adult  Completed      Plan:      Reviewed health maintenance screenings with patient today and relevant education, vaccines, and/or referrals were provided.   I have personally reviewed and noted the following in the patient's chart:   . Medical and social history . Use of alcohol, tobacco or illicit drugs  . Current medications and supplements . Functional ability and status . Nutritional status . Physical activity . Advanced directives . List of other physicians . Vitals . Screenings to include cognitive, depression, and falls . Referrals and appointments  In addition, I  have reviewed and discussed with patient certain preventive protocols,  quality metrics, and best practice recommendations. A written personalized care plan for preventive services as well as general preventive health recommendations were provided to patient.     Michiel Cowboy, RN  03/24/2019   Medical screening examination/treatment/procedure(s) were performed by non-physician practitioner and as supervising physician I was immediately available for consultation/collaboration. I agree with above. Binnie Rail, MD

## 2019-03-23 NOTE — Patient Instructions (Addendum)
  Tests ordered today. Your results will be released to MyChart (or called to you) after review.  If any changes need to be made, you will be notified at that same time.    Medications reviewed and updated.  Changes include :   none    Please followup in 1 year  

## 2019-03-24 ENCOUNTER — Encounter: Payer: Self-pay | Admitting: Internal Medicine

## 2019-03-24 ENCOUNTER — Ambulatory Visit (INDEPENDENT_AMBULATORY_CARE_PROVIDER_SITE_OTHER): Payer: Medicare Other | Admitting: *Deleted

## 2019-03-24 ENCOUNTER — Other Ambulatory Visit: Payer: Self-pay

## 2019-03-24 ENCOUNTER — Other Ambulatory Visit (INDEPENDENT_AMBULATORY_CARE_PROVIDER_SITE_OTHER): Payer: Medicare Other

## 2019-03-24 ENCOUNTER — Ambulatory Visit (INDEPENDENT_AMBULATORY_CARE_PROVIDER_SITE_OTHER): Payer: Medicare Other | Admitting: Internal Medicine

## 2019-03-24 VITALS — BP 116/68 | HR 74 | Temp 98.4°F | Resp 16 | Ht 62.0 in | Wt 121.0 lb

## 2019-03-24 VITALS — BP 116/68 | HR 74 | Temp 98.4°F | Resp 17 | Ht 62.0 in | Wt 121.0 lb

## 2019-03-24 DIAGNOSIS — E782 Mixed hyperlipidemia: Secondary | ICD-10-CM

## 2019-03-24 DIAGNOSIS — H8109 Meniere's disease, unspecified ear: Secondary | ICD-10-CM

## 2019-03-24 DIAGNOSIS — E039 Hypothyroidism, unspecified: Secondary | ICD-10-CM

## 2019-03-24 DIAGNOSIS — M48061 Spinal stenosis, lumbar region without neurogenic claudication: Secondary | ICD-10-CM

## 2019-03-24 DIAGNOSIS — Z23 Encounter for immunization: Secondary | ICD-10-CM

## 2019-03-24 DIAGNOSIS — R768 Other specified abnormal immunological findings in serum: Secondary | ICD-10-CM

## 2019-03-24 DIAGNOSIS — R1032 Left lower quadrant pain: Secondary | ICD-10-CM | POA: Diagnosis not present

## 2019-03-24 DIAGNOSIS — M8589 Other specified disorders of bone density and structure, multiple sites: Secondary | ICD-10-CM

## 2019-03-24 DIAGNOSIS — Z0000 Encounter for general adult medical examination without abnormal findings: Secondary | ICD-10-CM | POA: Diagnosis not present

## 2019-03-24 LAB — CBC WITH DIFFERENTIAL/PLATELET
Basophils Absolute: 0 10*3/uL (ref 0.0–0.1)
Basophils Relative: 0.6 % (ref 0.0–3.0)
Eosinophils Absolute: 0.1 10*3/uL (ref 0.0–0.7)
Eosinophils Relative: 1.1 % (ref 0.0–5.0)
HCT: 42.9 % (ref 36.0–46.0)
Hemoglobin: 14.5 g/dL (ref 12.0–15.0)
Lymphocytes Relative: 30.5 % (ref 12.0–46.0)
Lymphs Abs: 1.8 10*3/uL (ref 0.7–4.0)
MCHC: 33.8 g/dL (ref 30.0–36.0)
MCV: 94.4 fl (ref 78.0–100.0)
Monocytes Absolute: 0.4 10*3/uL (ref 0.1–1.0)
Monocytes Relative: 6.2 % (ref 3.0–12.0)
Neutro Abs: 3.7 10*3/uL (ref 1.4–7.7)
Neutrophils Relative %: 61.6 % (ref 43.0–77.0)
Platelets: 247 10*3/uL (ref 150.0–400.0)
RBC: 4.55 Mil/uL (ref 3.87–5.11)
RDW: 13.3 % (ref 11.5–15.5)
WBC: 6 10*3/uL (ref 4.0–10.5)

## 2019-03-24 LAB — COMPREHENSIVE METABOLIC PANEL
ALT: 19 U/L (ref 0–35)
AST: 18 U/L (ref 0–37)
Albumin: 4.4 g/dL (ref 3.5–5.2)
Alkaline Phosphatase: 51 U/L (ref 39–117)
BUN: 24 mg/dL — ABNORMAL HIGH (ref 6–23)
CO2: 29 mEq/L (ref 19–32)
Calcium: 10.4 mg/dL (ref 8.4–10.5)
Chloride: 101 mEq/L (ref 96–112)
Creatinine, Ser: 1 mg/dL (ref 0.40–1.20)
GFR: 53.91 mL/min — ABNORMAL LOW (ref >60.00)
Glucose, Bld: 91 mg/dL (ref 70–99)
Potassium: 3.8 mEq/L (ref 3.5–5.1)
Sodium: 141 mEq/L (ref 135–145)
Total Bilirubin: 0.6 mg/dL (ref 0.2–1.2)
Total Protein: 7.3 g/dL (ref 6.0–8.3)

## 2019-03-24 LAB — LIPID PANEL
Cholesterol: 196 mg/dL (ref 0–200)
HDL: 62.8 mg/dL (ref >39.00)
LDL Cholesterol: 117 mg/dL — ABNORMAL HIGH (ref 0–99)
NonHDL: 133.14
Total CHOL/HDL Ratio: 3
Triglycerides: 79 mg/dL (ref 0.0–149.0)
VLDL: 15.8 mg/dL (ref 0.0–40.0)

## 2019-03-24 LAB — TSH: TSH: 4.03 u[IU]/mL (ref 0.35–4.50)

## 2019-03-24 NOTE — Assessment & Plan Note (Signed)
Continues to have intermittent left lower quadrant abdominal pain-this is chronic Likely gastrointestinal in nature-diverticulosis and narrowing of the colon Taking fiber and stool softener daily and that has helped Continue

## 2019-03-24 NOTE — Assessment & Plan Note (Signed)
Clinically euthyroid Check tsh  Titrate med dose if needed  

## 2019-03-24 NOTE — Assessment & Plan Note (Addendum)
Chronic lower back pain Taking diclofenac as needed

## 2019-03-24 NOTE — Assessment & Plan Note (Signed)
Check lipid panel, CMP, TSH Diet controlled Continue regular exercise

## 2019-03-24 NOTE — Assessment & Plan Note (Signed)
Has had a positive test in the past, but since then has had a negative test Will recheck today

## 2019-03-24 NOTE — Patient Instructions (Signed)
Continue doing brain stimulating activities (puzzles, reading, adult coloring books, staying active) to keep memory sharp.   Continue to eat heart healthy diet (full of fruits, vegetables, whole grains, lean protein, water--limit salt, fat, and sugar intake) and increase physical activity as tolerated.   Holly Richard , Thank you for taking time to come for your Medicare Wellness Visit. I appreciate your ongoing commitment to your health goals. Please review the following plan we discussed and let me know if I can assist you in the future.   These are the goals we discussed: Goals    . Patient Stated     Exercise more by doing a class at church, eat healthier by increasing fruits and vegetables, continue to travel, enjoy church, and enjoy life.    . Stay as healthy and as independent as possible     Continue to eat healthy and exercise, enjoy life, family and worship God       This is a list of the screening recommended for you and due dates:  Health Maintenance  Topic Date Due  . Flu Shot  02/06/2019  . Colon Cancer Screening  01/01/2021  . Mammogram  01/18/2021  . DEXA scan (bone density measurement)  01/21/2022  . Tetanus Vaccine  11/09/2023  . Pneumonia vaccines  Completed    Preventive Care 76 Years and Older, Female Preventive care refers to lifestyle choices and visits with your health care provider that can promote health and wellness. This includes:  A yearly physical exam. This is also called an annual well check.  Regular dental and eye exams.  Immunizations.  Screening for certain conditions.  Healthy lifestyle choices, such as diet and exercise. What can I expect for my preventive care visit? Physical exam Your health care provider will check:  Height and weight. These may be used to calculate body mass index (BMI), which is a measurement that tells if you are at a healthy weight.  Heart rate and blood pressure.  Your skin for abnormal spots. Counseling  Your health care provider may ask you questions about:  Alcohol, tobacco, and drug use.  Emotional well-being.  Home and relationship well-being.  Sexual activity.  Eating habits.  History of falls.  Memory and ability to understand (cognition).  Work and work Statistician.  Pregnancy and menstrual history. What immunizations do I need?  Influenza (flu) vaccine  This is recommended every year. Tetanus, diphtheria, and pertussis (Tdap) vaccine  You may need a Td booster every 10 years. Varicella (chickenpox) vaccine  You may need this vaccine if you have not already been vaccinated. Zoster (shingles) vaccine  You may need this after age 30. Pneumococcal conjugate (PCV13) vaccine  One dose is recommended after age 76. Pneumococcal polysaccharide (PPSV23) vaccine  One dose is recommended after age 59. Measles, mumps, and rubella (MMR) vaccine  You may need at least one dose of MMR if you were born in 1957 or later. You may also need a second dose. Meningococcal conjugate (MenACWY) vaccine  You may need this if you have certain conditions. Hepatitis A vaccine  You may need this if you have certain conditions or if you travel or work in places where you may be exposed to hepatitis A. Hepatitis B vaccine  You may need this if you have certain conditions or if you travel or work in places where you may be exposed to hepatitis B. Haemophilus influenzae type b (Hib) vaccine  You may need this if you have certain conditions. You  may receive vaccines as individual doses or as more than one vaccine together in one shot (combination vaccines). Talk with your health care provider about the risks and benefits of combination vaccines. What tests do I need? Blood tests  Lipid and cholesterol levels. These may be checked every 5 years, or more frequently depending on your overall health.  Hepatitis C test.  Hepatitis B test. Screening  Lung cancer screening. You may  have this screening every year starting at age 64 if you have a 30-pack-year history of smoking and currently smoke or have quit within the past 15 years.  Colorectal cancer screening. All adults should have this screening starting at age 76 and continuing until age 63. Your health care provider may recommend screening at age 60 if you are at increased risk. You will have tests every 1-10 years, depending on your results and the type of screening test.  Diabetes screening. This is done by checking your blood sugar (glucose) after you have not eaten for a while (fasting). You may have this done every 1-3 years.  Mammogram. This may be done every 1-2 years. Talk with your health care provider about how often you should have regular mammograms.  BRCA-related cancer screening. This may be done if you have a family history of breast, ovarian, tubal, or peritoneal cancers. Other tests  Sexually transmitted disease (STD) testing.  Bone density scan. This is done to screen for osteoporosis. You may have this done starting at age 76. Follow these instructions at home: Eating and drinking  Eat a diet that includes fresh fruits and vegetables, whole grains, lean protein, and low-fat dairy products. Limit your intake of foods with high amounts of sugar, saturated fats, and salt.  Take vitamin and mineral supplements as recommended by your health care provider.  Do not drink alcohol if your health care provider tells you not to drink.  If you drink alcohol: ? Limit how much you have to 0-1 drink a day. ? Be aware of how much alcohol is in your drink. In the [place] one drink equals one 12 oz bottle of beer (355 mL), one 5 oz glass of Holly Richard (148 mL), or one 1 oz glass of hard liquor (44 mL). Lifestyle  Take daily care of your teeth and gums.  Stay active. Exercise for at least 30 minutes on 5 or more days each week.  Do not use any products that contain nicotine or tobacco, such as cigarettes,  e-cigarettes, and chewing tobacco. If you need help quitting, ask your health care provider.  If you are sexually active, practice safe sex. Use a condom or other form of protection in order to prevent STIs (sexually transmitted infections).  Talk with your health care provider about taking a low-dose aspirin or statin. What's next?  Go to your health care provider once a year for a well check visit.  Ask your health care provider how often you should have your eyes and teeth checked.  Stay up to date on all vaccines. This information is not intended to replace advice given to you by your health care provider. Make sure you discuss any questions you have with your health care provider. Document Released: 07/21/2015 Document Revised: 06/18/2018 Document Reviewed: 06/18/2018 Elsevier Patient Education  2020 [employer].

## 2019-03-25 ENCOUNTER — Encounter: Payer: Self-pay | Admitting: Internal Medicine

## 2019-03-25 LAB — HEPATITIS C ANTIBODY
Hepatitis C Ab: NONREACTIVE
SIGNAL TO CUT-OFF: 0.23 (ref <1.00)

## 2019-04-05 ENCOUNTER — Other Ambulatory Visit: Payer: Self-pay | Admitting: Internal Medicine

## 2019-04-15 ENCOUNTER — Encounter: Payer: Self-pay | Admitting: Gynecology

## 2019-05-07 DIAGNOSIS — H16223 Keratoconjunctivitis sicca, not specified as Sjogren's, bilateral: Secondary | ICD-10-CM | POA: Diagnosis not present

## 2019-05-07 DIAGNOSIS — H2513 Age-related nuclear cataract, bilateral: Secondary | ICD-10-CM | POA: Diagnosis not present

## 2019-07-05 ENCOUNTER — Other Ambulatory Visit: Payer: Self-pay | Admitting: Internal Medicine

## 2019-07-27 ENCOUNTER — Other Ambulatory Visit: Payer: Self-pay | Admitting: Obstetrics & Gynecology

## 2019-07-27 ENCOUNTER — Other Ambulatory Visit: Payer: Self-pay

## 2019-07-27 ENCOUNTER — Ambulatory Visit
Admission: RE | Admit: 2019-07-27 | Discharge: 2019-07-27 | Disposition: A | Discharge status: Home / Self Care | Payer: Medicare Other | Source: Ambulatory Visit | Attending: Obstetrics & Gynecology | Admitting: Obstetrics & Gynecology

## 2019-07-27 DIAGNOSIS — R921 Mammographic calcification found on diagnostic imaging of breast: Secondary | ICD-10-CM

## 2019-08-17 DIAGNOSIS — H838X3 Other specified diseases of inner ear, bilateral: Secondary | ICD-10-CM | POA: Diagnosis not present

## 2019-08-17 DIAGNOSIS — H903 Sensorineural hearing loss, bilateral: Secondary | ICD-10-CM | POA: Diagnosis not present

## 2019-08-17 DIAGNOSIS — H8101 Meniere's disease, right ear: Secondary | ICD-10-CM | POA: Diagnosis not present

## 2019-09-03 ENCOUNTER — Ambulatory Visit: Payer: Medicare Other

## 2019-10-04 ENCOUNTER — Other Ambulatory Visit: Payer: Self-pay | Admitting: Internal Medicine

## 2019-12-23 ENCOUNTER — Ambulatory Visit (INDEPENDENT_AMBULATORY_CARE_PROVIDER_SITE_OTHER): Payer: Medicare Other | Admitting: Obstetrics & Gynecology

## 2019-12-23 ENCOUNTER — Other Ambulatory Visit: Payer: Self-pay

## 2019-12-23 ENCOUNTER — Encounter: Payer: Self-pay | Admitting: Obstetrics & Gynecology

## 2019-12-23 VITALS — BP 130/86 | Ht 61.5 in | Wt 118.0 lb

## 2019-12-23 DIAGNOSIS — Z01419 Encounter for gynecological examination (general) (routine) without abnormal findings: Secondary | ICD-10-CM | POA: Diagnosis not present

## 2019-12-23 DIAGNOSIS — Z780 Asymptomatic menopausal state: Secondary | ICD-10-CM

## 2019-12-23 DIAGNOSIS — M8589 Other specified disorders of bone density and structure, multiple sites: Secondary | ICD-10-CM

## 2019-12-23 NOTE — Progress Notes (Signed)
Holly Richard Apr 24, 1941 [id number]   History:    79 y.o. G3P2A1L2  Married.  GM of 4.  RP:  Established patient presenting for annual gyn exam   HPI: Menopause on no hormone replacement therapy.  No postmenopausal bleeding.  No pelvic pain.  Sciatica, taking Diflifenac 50 mg tab daily.  Urine and bowel movements normal.  Abstinent.  Breasts normal.  Body mass index 21.93.  Active lifestyle.  Osteopenia BD 01/2019.  Health labs with family physician.  Colon 2017.     Past medical history,surgical history, family history and social history were all reviewed and documented in the EPIC chart.  Gynecologic History No LMP recorded. Patient is postmenopausal.  Obstetric History OB History  Gravida Para Term Preterm AB Living  3 2     1 2   SAB TAB Ectopic Multiple Live Births  1            # Outcome Date GA Lbr Len/2nd Weight Sex Delivery Anes PTL Lv  3 SAB           2 Para           1 Para              ROS: A ROS was performed and pertinent positives and negatives are included in the history.  GENERAL: No fevers or chills. HEENT: No change in vision, no earache, sore throat or sinus congestion. NECK: No pain or stiffness. CARDIOVASCULAR: No chest pain or pressure. No palpitations. PULMONARY: No shortness of breath, cough or wheeze. GASTROINTESTINAL: No abdominal pain, nausea, vomiting or diarrhea, melena or bright red blood per rectum. GENITOURINARY: No urinary frequency, urgency, hesitancy or dysuria. MUSCULOSKELETAL: No joint or muscle pain, no back pain, no recent trauma. DERMATOLOGIC: No rash, no itching, no lesions. ENDOCRINE: No polyuria, polydipsia, no heat or cold intolerance. No recent change in weight. HEMATOLOGICAL: No anemia or easy bruising or bleeding. NEUROLOGIC: No headache, seizures, numbness, tingling or weakness. PSYCHIATRIC: No depression, no loss of interest in normal activity or change in sleep pattern.     Exam:   BP 130/86   Ht 5' 1.5" (1.562 m)   Wt  118 lb (53.5 kg)   BMI 21.93 kg/m   Body mass index is 21.93 kg/m.  General appearance : Well developed well nourished female. No acute distress HEENT: Eyes: no retinal hemorrhage or exudates,  Neck supple, trachea midline, no carotid bruits, no thyroidmegaly Lungs: Clear to auscultation, no rhonchi or wheezes, or rib retractions  Heart: Regular rate and rhythm, no murmurs or gallops Breast:Examined in sitting and supine position were symmetrical in appearance, no palpable masses or tenderness,  no skin retraction, no nipple inversion, no nipple discharge, no skin discoloration, no axillary or supraclavicular lymphadenopathy Abdomen: no palpable masses or tenderness, no rebound or guarding Extremities: no edema or skin discoloration or tenderness  Pelvic: Vulva: Normal             Vagina: No gross lesions or discharge  Cervix: No gross lesions or discharge  Uterus  AV, normal size, shape and consistency, non-tender and mobile  Adnexa  Without masses or tenderness  Anus: Normal   Assessment/Plan:  79 y.o. female for annual exam   1. Well female exam with routine gynecological exam normal gynecologic exam in menopause.  Pap test June 2020 was negative, no indication to repeat this year.  Breast exam normal.  Screening mammogram July 2020 was negative on the right and showed calcifications on the  left for which patient had a left diagnostic mammogram and ultrasound probably benign.  A repeat breast ultrasound was done January 2021 which was also probably benign.  Colonoscopy 2017.  Health labs with family physician.  Good body mass index at 21.93.  Continue with fitness and healthy nutrition.  2. Postmenopause well on no hormone replacement therapy.  No postmenopausal bleeding.  3. Osteopenia of multiple sites bone density July 2020 showed osteopenia.  We will repeat a bone density July 2022.  Continue with vitamin D supplements, calcium intake of 1200 mg daily and regular weightbearing  physical activities.  Princess Bruins MD, 10:13 AM 12/23/2019

## 2019-12-23 NOTE — Patient Instructions (Signed)
1. Well female exam with routine gynecological exam normal gynecologic exam in menopause.  Pap test June 2020 was negative, no indication to repeat this year.  Breast exam normal.  Screening mammogram July 2020 was negative on the right and showed calcifications on the left for which patient had a left diagnostic mammogram and ultrasound probably benign.  A repeat breast ultrasound was done January 2021 which was also probably benign.  Colonoscopy 2017.  Health labs with family physician.  Good body mass index at 21.93.  Continue with fitness and healthy nutrition.  2. Postmenopause well on no hormone replacement therapy.  No postmenopausal bleeding.  3. Osteopenia of multiple sites bone density July 2020 showed osteopenia.  We will repeat a bone density July 2022.  Continue with vitamin D supplements, calcium intake of 1200 mg daily and regular weightbearing physical activities.  Holly Richard, it was a pleasure seeing you today!

## 2019-12-30 ENCOUNTER — Other Ambulatory Visit: Payer: Self-pay | Admitting: Internal Medicine

## 2020-01-17 ENCOUNTER — Other Ambulatory Visit: Payer: Self-pay

## 2020-01-17 ENCOUNTER — Ambulatory Visit
Admission: RE | Admit: 2020-01-17 | Discharge: 2020-01-17 | Disposition: A | Discharge status: Home / Self Care | Payer: Medicare Other | Source: Ambulatory Visit | Attending: Obstetrics & Gynecology | Admitting: Obstetrics & Gynecology

## 2020-01-17 DIAGNOSIS — R921 Mammographic calcification found on diagnostic imaging of breast: Secondary | ICD-10-CM

## 2020-01-26 DIAGNOSIS — M4726 Other spondylosis with radiculopathy, lumbar region: Secondary | ICD-10-CM | POA: Diagnosis not present

## 2020-01-26 DIAGNOSIS — M4180 Other forms of scoliosis, site unspecified: Secondary | ICD-10-CM | POA: Diagnosis not present

## 2020-01-26 DIAGNOSIS — M4316 Spondylolisthesis, lumbar region: Secondary | ICD-10-CM | POA: Diagnosis not present

## 2020-03-17 DIAGNOSIS — Z23 Encounter for immunization: Secondary | ICD-10-CM | POA: Diagnosis not present

## 2020-03-17 DIAGNOSIS — T148XXA Other injury of unspecified body region, initial encounter: Secondary | ICD-10-CM | POA: Diagnosis not present

## 2020-03-23 NOTE — Progress Notes (Signed)
Subjective:    Patient ID: Holly Richard, female    DOB: February 22, 1941, 79 y.o.   MRN: [id number]  HPI The patient is here for follow up of their chronic medical problems, including hypothyroidism, hyperlipidemia, meniere's disease, osteopenia, chronic lower back pain  She is taking all of her medications as prescribed.   She is exercising regularly.   Walking and yard work.   She has noticed some hair thinning and wonders about her thyroid.  1 week ago she was using a hedge tremor and cut her left hand into her fingers.  She did go to urgent care and had stitches placed in those 3 areas.  Medications and allergies reviewed with patient and updated if appropriate.  Patient Active Problem List   Diagnosis Date Noted  . LLQ abdominal pain, chronic, intermittent 07/24/2018  . Lumbar stenosis 12/28/2014  . Hepatitis C antibody test positive - false positive 12/07/2014  . Hypothyroidism 07/26/2010  . Hyperlipidemia 07/26/2010  . MIGRAINE HEADACHE 07/26/2010  . Meniere's disease 07/26/2010  . ARTHRITIS 07/26/2010  . ALLERGIC RHINITIS 07/05/2010  . DIVERTICULOSIS, COLON 07/05/2010  . Osteopenia 07/05/2010    Current Outpatient Medications on File Prior to Visit  Medication Sig Dispense Refill  . acetaminophen (TYLENOL) 500 MG tablet Take 500 mg by mouth every 6 (six) hours as needed for mild pain or moderate pain.    Marland Kitchen CALCIUM-VITAMIN D PO Take 1 tablet by mouth daily.     . Diclofenac Potassium 50 MG PACK Take 75 mg by mouth every other day.     . fluticasone (FLONASE) 50 MCG/ACT nasal spray Place 1 spray into both nostrils daily as needed for allergies or rhinitis.    Marland Kitchen levothyroxine (EUTHYROX) 25 MCG tablet Take 1 tablet (25 mcg total) by mouth daily before breakfast. Annual appt w/labs due in Sept must see provider for future refills 90 tablet 0  . Multiple Vitamins-Minerals (QC WOMENS DAILY MULTIVITAMIN) TABS Take 1 tablet by mouth daily.     . Omega-3 Fatty Acids (FISH  OIL TRIPLE STRENGTH) 1400 MG CAPS Take 1 tablet by mouth daily.     Vladimir Faster Glycol-Propyl Glycol (SYSTANE) 0.4-0.3 % SOLN Apply to eye as needed.    . triamterene-hydrochlorothiazide (MAXZIDE-25) 37.5-25 MG per tablet Take 1 tablet by mouth daily.      Marland Kitchen UNABLE TO FIND instaflex joint support take 1 tablet a day     No current facility-administered medications on file prior to visit.    Past Medical History:  Diagnosis Date  . ALLERGIC RHINITIS   . Allergy   . ARTHRITIS   . Arthritis   . DIVERTICULOSIS, COLON   . Hepatitis C antibody test positive 11/2014   negative RNA confirmation test  . HYPERLIPIDEMIA   . HYPOTHYROIDISM   . MENIERE'S DISEASE   . MIGRAINE HEADACHE   . OSTEOPENIA     Past Surgical History:  Procedure Laterality Date  . (R) Ear implants  10/2008   Hearing loss-coclear implant   . BUNIONECTOMY     X's - (R) 2/04 & (L) 3/04  . COLONOSCOPY    . LUMBAR LAMINECTOMY/DECOMPRESSION MICRODISCECTOMY Left 12/28/2014   Procedure: LUMBAR LAMINECTOMY/DECOMPRESSION MICRODISCECTOMY 2 LEVELS;  Surgeon: Jovita Gamma, MD;  Location: [facility];  Service: Neurosurgery;  Laterality: Left;  Left L34 laminotomy, foraminotomy and poss microdiskectomy with left L45 laminotomy, foraminotomy, resection of synovial cyst and poss microdiskectomy  . TONSILLECTOMY  1954    Social History   Socioeconomic History  .  Marital status: Married    Spouse name: Not on file  . Number of children: 2  . Years of education: Not on file  . Highest education level: Not on file  Occupational History  . Not on file  Tobacco Use  . Smoking status: Never Smoker  . Smokeless tobacco: Never Used  Vaping Use  . Vaping Use: Never used  Substance and Sexual Activity  . Alcohol use: No    Alcohol/week: 0.0 standard drinks  . Drug use: No  . Sexual activity: Not Currently    Comment: 1st intercourse-20, partners- 1, married- 65 yrs   Other Topics Concern  . Not on file  Social History  Narrative   Married, lives with spouse-2 sons and 4 g-kids nearby.    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 Difficulty of Paying Living Expenses: Not on file  Food Insecurity:   . Worried About Charity fundraiser in the Last Year: Not on file  . Ran Out of Food in the Last Year: Not on file  Transportation Needs:   . Lack of Transportation (Medical): Not on file  . Lack of Transportation (Non-Medical): Not on file  Physical Activity:   . Days of Exercise per Week: Not on file  . Minutes of Exercise per Session: Not on file  Stress:   . Feeling of Stress : Not on file  Social Connections:   . Frequency of Communication with Friends and Family: Not on file  . Frequency of Social Gatherings with Friends and Family: Not on file  . Attends Religious Services: Not on file  . Active Member of Clubs or Organizations: Not on file  . Attends Archivist Meetings: Not on file  . Marital Status: Not on file    Family History  Problem Relation Age of Onset  . Arthritis Mother   . Diabetes Mother   . Heart disease Mother   . Stroke Mother   . Colon cancer Father 28  . Stroke Father   . Breast cancer Sister   . Diabetes Sister   . Lung cancer Sister   . Diabetes Brother   . Prostate cancer Brother   . Colon cancer Brother   . Brain cancer Other        Nephew  . Colon polyps Neg Hx   . Rectal cancer Neg Hx   . Stomach cancer Neg Hx     Review of Systems  Constitutional: Negative for chills, fatigue and fever.  Respiratory: Negative for Richard, shortness of breath and wheezing.   Cardiovascular: Negative for chest pain, palpitations and leg swelling.  Gastrointestinal: Positive for constipation. Negative for abdominal pain, blood in stool, diarrhea and nausea.       No gerd  Skin:       Hair thining  Neurological: Negative for light-headedness and headaches.       Objective:   Vitals:    03/24/20 0802  BP: 122/68  Pulse: 81  Temp: 98.6 F (37 C)  SpO2: 96%   BP Readings from Last 3 Encounters:  03/24/20 122/68  12/23/19 130/86  03/24/19 116/68   Wt Readings from Last 3 Encounters:  03/24/20 117 lb 3.2 oz (53.2 kg)  12/23/19 118 lb (53.5 kg)  03/24/19 121 lb (54.9 kg)   Body mass index is 21.79 kg/m.   Physical Exam    Constitutional: Appears well-developed and well-nourished. No distress.  HENT:  Head: Normocephalic and atraumatic.  Neck: Neck supple. No tracheal deviation present. No thyromegaly present.  No cervical lymphadenopathy Cardiovascular: Normal rate, regular rhythm and normal heart sounds.   No murmur heard. No carotid bruit .  No edema Pulmonary/Chest: Effort normal and breath sounds normal. No respiratory distress. No has no wheezes. No rales. Left hand: Hand and second-third fingers with sutures.  Wounds have healed and there is no open areas.  No discharge or bleeding.  Areas are slightly tender.  Good sensation.  No swelling or erythema surrounding lacerations. Skin: Skin is warm and dry. Not diaphoretic.  Psychiatric: Normal mood and affect. Behavior is normal.     Assessment & Plan:    See Problem List for Assessment and Plan of chronic medical problems.    This visit occurred during the SARS-CoV-2 public health emergency.  Safety protocols were in place, including screening questions prior to the visit, additional usage of staff PPE, and extensive cleaning of exam room while observing appropriate contact time as indicated for disinfecting solutions.

## 2020-03-23 NOTE — Patient Instructions (Addendum)
Your sutures were removed today.   Blood work was ordered.    Flu immunization administered today.    Medications reviewed and updated.  Changes include :  none   Your prescription(s) have been submitted to your pharmacy. Please take as directed and contact our office if you believe you are having problem(s) with the medication(s).    Please followup in 1 year

## 2020-03-24 ENCOUNTER — Ambulatory Visit (INDEPENDENT_AMBULATORY_CARE_PROVIDER_SITE_OTHER): Payer: Medicare Other | Admitting: Internal Medicine

## 2020-03-24 ENCOUNTER — Ambulatory Visit (INDEPENDENT_AMBULATORY_CARE_PROVIDER_SITE_OTHER): Payer: Medicare Other

## 2020-03-24 ENCOUNTER — Encounter: Payer: Self-pay | Admitting: Internal Medicine

## 2020-03-24 ENCOUNTER — Other Ambulatory Visit: Payer: Self-pay

## 2020-03-24 VITALS — BP 122/68 | HR 81 | Temp 98.6°F | Wt 117.2 lb

## 2020-03-24 VITALS — BP 122/68 | HR 81 | Temp 98.6°F | Ht 62.0 in | Wt 117.2 lb

## 2020-03-24 DIAGNOSIS — E782 Mixed hyperlipidemia: Secondary | ICD-10-CM | POA: Diagnosis not present

## 2020-03-24 DIAGNOSIS — M8589 Other specified disorders of bone density and structure, multiple sites: Secondary | ICD-10-CM | POA: Diagnosis not present

## 2020-03-24 DIAGNOSIS — R1032 Left lower quadrant pain: Secondary | ICD-10-CM | POA: Diagnosis not present

## 2020-03-24 DIAGNOSIS — E039 Hypothyroidism, unspecified: Secondary | ICD-10-CM | POA: Diagnosis not present

## 2020-03-24 DIAGNOSIS — S61412A Laceration without foreign body of left hand, initial encounter: Secondary | ICD-10-CM | POA: Diagnosis not present

## 2020-03-24 DIAGNOSIS — Z23 Encounter for immunization: Secondary | ICD-10-CM | POA: Diagnosis not present

## 2020-03-24 DIAGNOSIS — S61419A Laceration without foreign body of unspecified hand, initial encounter: Secondary | ICD-10-CM | POA: Insufficient documentation

## 2020-03-24 DIAGNOSIS — H8109 Meniere's disease, unspecified ear: Secondary | ICD-10-CM | POA: Diagnosis not present

## 2020-03-24 DIAGNOSIS — Z0000 Encounter for general adult medical examination without abnormal findings: Secondary | ICD-10-CM

## 2020-03-24 NOTE — Patient Instructions (Addendum)
Ms. Pherigo , Thank you for taking time to come for your Medicare Wellness Visit. I appreciate your ongoing commitment to your health goals. Please review the following plan we discussed and let me know if I can assist you in the future.   Screening recommendations/referrals: Colonoscopy: 01/02/2016; due every 5 years  Mammogram: 01/17/2020; due every year Bone Density: 01/22/2019; due every 3 years (low bone mass) Recommended yearly ophthalmology/optometry visit for glaucoma screening and checkup Recommended yearly dental visit for hygiene and checkup  Vaccinations: Influenza vaccine: 03/24/2020 Pneumococcal vaccine: completed Tdap vaccine: 11/08/2013; due 2025 Shingles vaccine: never done   Covid-19: completed; will be getting booster  Advanced directives: Please bring a copy of your health care power of attorney and living will to the office at your convenience.  Conditions/risks identified: Yes; Reviewed health maintenance screenings with patient today and relevant education, vaccines, and/or referrals were provided. Please continue to do your personal lifestyle choices by: daily care of teeth and gums, regular physical activity (goal should be 5 days a week for 30 minutes), eat a healthy diet, avoid tobacco and drug use, limiting any alcohol intake, taking a low-dose aspirin (if not allergic or have been advised by your provider otherwise) and taking vitamins and minerals as recommended by your provider. Continue doing brain stimulating activities (puzzles, reading, adult coloring books, staying active) to keep memory sharp. Continue to eat heart healthy diet (full of fruits, vegetables, whole grains, lean protein, water--limit salt, fat, and sugar intake) and increase physical activity as tolerated.  Next appointment: Please schedule your next Medicare Wellness Visit with your Nurse Health Advisor in 1 year.  Preventive Care 25 Years and Older, Female Preventive care refers to lifestyle  choices and visits with your health care provider that can promote health and wellness. What does preventive care include?  A yearly physical exam. This is also called an annual well check.  Dental exams once or twice a year.  Routine eye exams. Ask your health care provider how often you should have your eyes checked.  Personal lifestyle choices, including:  Daily care of your teeth and gums.  Regular physical activity.  Eating a healthy diet.  Avoiding tobacco and drug use.  Limiting alcohol use.  Practicing safe sex.  Taking low-dose aspirin every day.  Taking vitamin and mineral supplements as recommended by your health care provider. What happens during an annual well check? The services and screenings done by your health care provider during your annual well check will depend on your age, overall health, lifestyle risk factors, and family history of disease. Counseling  Your health care provider may ask you questions about your:  Alcohol use.  Tobacco use.  Drug use.  Emotional well-being.  Home and relationship well-being.  Sexual activity.  Eating habits.  History of falls.  Memory and ability to understand (cognition).  Work and work Statistician.  Reproductive health. Screening  You may have the following tests or measurements:  Height, weight, and BMI.  Blood pressure.  Lipid and cholesterol levels. These may be checked every 5 years, or more frequently if you are over 57 years old.  Skin check.  Lung cancer screening. You may have this screening every year starting at age 14 if you have a 30-pack-year history of smoking and currently smoke or have quit within the past 15 years.  Fecal occult blood test (FOBT) of the stool. You may have this test every year starting at age 62.  Flexible sigmoidoscopy or colonoscopy. You may  have a sigmoidoscopy every 5 years or a colonoscopy every 10 years starting at age 56.  Hepatitis C blood  test.  Hepatitis B blood test.  Sexually transmitted disease (STD) testing.  Diabetes screening. This is done by checking your blood sugar (glucose) after you have not eaten for a while (fasting). You may have this done every 1-3 years.  Bone density scan. This is done to screen for osteoporosis. You may have this done starting at age 85.  Mammogram. This may be done every 1-2 years. Talk to your health care provider about how often you should have regular mammograms. Talk with your health care provider about your test results, treatment options, and if necessary, the need for more tests. Vaccines  Your health care provider may recommend certain vaccines, such as:  Influenza vaccine. This is recommended every year.  Tetanus, diphtheria, and acellular pertussis (Tdap, Td) vaccine. You may need a Td booster every 10 years.  Zoster vaccine. You may need this after age 32.  Pneumococcal 13-valent conjugate (PCV13) vaccine. One dose is recommended after age 53.  Pneumococcal polysaccharide (PPSV23) vaccine. One dose is recommended after age 78. Talk to your health care provider about which screenings and vaccines you need and how often you need them. This information is not intended to replace advice given to you by your health care provider. Make sure you discuss any questions you have with your health care provider. Document Released: 07/21/2015 Document Revised: 03/13/2016 Document Reviewed: 04/25/2015 Elsevier Interactive Patient Education  2017 [place] Prevention in the Home Falls can cause injuries. They can happen to people of all ages. There are many things you can do to make your home safe and to help prevent falls. What can I do on the outside of my home?  Regularly fix the edges of walkways and driveways and fix any cracks.  Remove anything that might make you trip as you walk through a door, such as a raised step or threshold.  Trim any bushes or trees on the  path to your home.  Use bright outdoor lighting.  Clear any walking paths of anything that might make someone trip, such as rocks or tools.  Regularly check to see if handrails are loose or broken. Make sure that both sides of any steps have handrails.  Any raised decks and porches should have guardrails on the edges.  Have any leaves, snow, or ice cleared regularly.  Use sand or salt on walking paths during winter.  Clean up any spills in your garage right away. This includes oil or grease spills. What can I do in the bathroom?  Use night lights.  Install grab bars by the toilet and in the tub and shower. Do not use towel bars as grab bars.  Use non-skid mats or decals in the tub or shower.  If you need to sit down in the shower, use a plastic, non-slip stool.  Keep the floor dry. Clean up any water that spills on the floor as soon as it happens.  Remove soap buildup in the tub or shower regularly.  Attach bath mats securely with double-sided non-slip rug tape.  Do not have throw rugs and other things on the floor that can make you trip. What can I do in the bedroom?  Use night lights.  Make sure that you have a light by your bed that is easy to reach.  Do not use any sheets or blankets that are too big for your  bed. They should not hang down onto the floor.  Have a firm chair that has side arms. You can use this for support while you get dressed.  Do not have throw rugs and other things on the floor that can make you trip. What can I do in the kitchen?  Clean up any spills right away.  Avoid walking on wet floors.  Keep items that you use a lot in easy-to-reach places.  If you need to reach something above you, use a strong step stool that has a grab bar.  Keep electrical cords out of the way.  Do not use floor polish or wax that makes floors slippery. If you must use wax, use non-skid floor wax.  Do not have throw rugs and other things on the floor that can  make you trip. What can I do with my stairs?  Do not leave any items on the stairs.  Make sure that there are handrails on both sides of the stairs and use them. Fix handrails that are broken or loose. Make sure that handrails are as long as the stairways.  Check any carpeting to make sure that it is firmly attached to the stairs. Fix any carpet that is loose or worn.  Avoid having throw rugs at the top or bottom of the stairs. If you do have throw rugs, attach them to the floor with carpet tape.  Make sure that you have a light switch at the top of the stairs and the bottom of the stairs. If you do not have them, ask someone to add them for you. What else can I do to help prevent falls?  Wear shoes that:  Do not have high heels.  Have rubber bottoms.  Are comfortable and fit you well.  Are closed at the toe. Do not wear sandals.  If you use a stepladder:  Make sure that it is fully opened. Do not climb a closed stepladder.  Make sure that both sides of the stepladder are locked into place.  Ask someone to hold it for you, if possible.  Clearly mark and make sure that you can see:  Any grab bars or handrails.  First and last steps.  Where the edge of each step is.  Use tools that help you move around (mobility aids) if they are needed. These include:  Canes.  Walkers.  Scooters.  Crutches.  Turn on the lights when you go into a dark area. Replace any light bulbs as soon as they burn out.  Set up your furniture so you have a clear path. Avoid moving your furniture around.  If any of your floors are uneven, fix them.  If there are any pets around you, be aware of where they are.  Review your medicines with your doctor. Some medicines can make you feel dizzy. This can increase your chance of falling. Ask your doctor what other things that you can do to help prevent falls. This information is not intended to replace advice given to you by your health care  provider. Make sure you discuss any questions you have with your health care provider. Document Released: 04/20/2009 Document Revised: 11/30/2015 Document Reviewed: 07/29/2014 Elsevier Interactive Patient Education  2017 [employer].

## 2020-03-24 NOTE — Assessment & Plan Note (Signed)
Chronic, occ Saw GI - likely related to constipation Constipation controlled

## 2020-03-24 NOTE — Assessment & Plan Note (Signed)
Chronic Check lipid panel  Diet controlled Regular exercise and healthy diet encouraged

## 2020-03-24 NOTE — Assessment & Plan Note (Signed)
Chronic dexa up to date Continue cal, vitamin d Continue exercise

## 2020-03-24 NOTE — Assessment & Plan Note (Signed)
Acute 1 week ago she cut her left hand into her fingers on a hedge tremor.  She did receive sutures at urgent care I will go ahead and remove them today so that she does not need to go back to urgent care Wounds are healing well no evidence of infection Sutures removed from all 3 lacerations She will keep them covered with a Band-Aid over the next few days

## 2020-03-24 NOTE — Assessment & Plan Note (Signed)
Chronic Sees ENT On maxzide

## 2020-03-24 NOTE — Assessment & Plan Note (Signed)
Chronic  Clinically euthyroid Check tsh  Titrate med dose if needed  

## 2020-03-24 NOTE — Progress Notes (Signed)
Subjective:   Holly Richard is a 79 y.o. female who presents for Medicare Annual (Subsequent) preventive examination.  Review of Systems    No ROS. Medicare Wellness Visit. Additional risk factors are reflected in social history. Cardiac Risk Factors include: advanced age (>52men, >18 women);family history of premature cardiovascular disease Sleep Patterns: No sleep issues, feels rested on waking and sleeps 6- 8 hours nightly. Gets up to void once during the night. Home Safety/Smoke Alarms: Feels safe in home; uses home alarm. Smoke alarms in place. Living environment: 1-story home; Lives with spouse; no needs for DME; good support system. Seat Belt Safety/Bike Helmet: Wears seat belt.    Objective:    Today's Vitals   03/24/20 0835  BP: 122/68  Pulse: 81  Temp: 98.6 F (37 C)  SpO2: 96%  Weight: 117 lb 3.2 oz (53.2 kg)  Height: 5\' 2"  (1.575 m)   Body mass index is 21.44 kg/m.  Advanced Directives 03/24/2020 03/24/2019 03/19/2018 03/18/2017 01/02/2016 12/19/2015 04/21/2015  Does Patient Have a Medical Advance Directive? Yes No;Yes Yes Yes Yes Yes No  Type of Paramedic of [place];Living will [place];Living will [place];Living will [place];Living will - [place];Living will [place];Living will  Does patient want to make changes to medical advance directive? No - Patient declined - - - - - -  Copy of [place] in Chart? No - copy requested No - copy requested No - copy requested No - copy requested No - copy requested - -    Current Medications (verified) Outpatient Encounter Medications as of 03/24/2020  Medication Sig  . acetaminophen (TYLENOL) 500 MG tablet Take 500 mg by mouth every 6 (six) hours as needed for mild pain or moderate pain.  Marland Kitchen CALCIUM-VITAMIN D PO Take 1 tablet by mouth daily.   . Diclofenac Potassium 50 MG PACK  Take 75 mg by mouth every other day.   . fluticasone (FLONASE) 50 MCG/ACT nasal spray Place 1 spray into both nostrils daily as needed for allergies or rhinitis.  Marland Kitchen levothyroxine (EUTHYROX) 25 MCG tablet Take 1 tablet (25 mcg total) by mouth daily before breakfast. Annual appt w/labs due in Sept must see provider for future refills  . Multiple Vitamins-Minerals (QC WOMENS DAILY MULTIVITAMIN) TABS Take 1 tablet by mouth daily.   . Omega-3 Fatty Acids (FISH OIL TRIPLE STRENGTH) 1400 MG CAPS Take 1 tablet by mouth daily.   Vladimir Faster Glycol-Propyl Glycol (SYSTANE) 0.4-0.3 % SOLN Apply to eye as needed.  . triamterene-hydrochlorothiazide (MAXZIDE-25) 37.5-25 MG per tablet Take 1 tablet by mouth daily.    Marland Kitchen UNABLE TO FIND instaflex joint support take 1 tablet a day   No facility-administered encounter medications on file as of 03/24/2020.    Allergies (verified) Patient has no known allergies.   History: Past Medical History:  Diagnosis Date  . ALLERGIC RHINITIS   . Allergy   . ARTHRITIS   . Arthritis   . DIVERTICULOSIS, COLON   . Hepatitis C antibody test positive 11/2014   negative RNA confirmation test  . HYPERLIPIDEMIA   . HYPOTHYROIDISM   . MENIERE'S DISEASE   . MIGRAINE HEADACHE   . OSTEOPENIA    Past Surgical History:  Procedure Laterality Date  . (R) Ear implants  10/2008   Hearing loss-coclear implant   . BUNIONECTOMY     X's - (R) 2/04 & (L) 3/04  . COLONOSCOPY    .  LUMBAR LAMINECTOMY/DECOMPRESSION MICRODISCECTOMY Left 12/28/2014   Procedure: LUMBAR LAMINECTOMY/DECOMPRESSION MICRODISCECTOMY 2 LEVELS;  Surgeon: Jovita Gamma, MD;  Location: [facility];  Service: Neurosurgery;  Laterality: Left;  Left L34 laminotomy, foraminotomy and poss microdiskectomy with left L45 laminotomy, foraminotomy, resection of synovial cyst and poss microdiskectomy  . TONSILLECTOMY  1954   Family History  Problem Relation Age of Onset  . Arthritis Mother   . Diabetes Mother   . Heart  disease Mother   . Stroke Mother   . Colon cancer Father 49  . Stroke Father   . Breast cancer Sister   . Diabetes Sister   . Lung cancer Sister   . Diabetes Brother   . Prostate cancer Brother   . Colon cancer Brother   . Brain cancer Other        Nephew  . Colon polyps Neg Hx   . Rectal cancer Neg Hx   . Stomach cancer Neg Hx    Social History   Socioeconomic History  . Marital status: Married    Spouse name: Not on file  . Number of children: 2  . Years of education: Not on file  . Highest education level: Not on file  Occupational History  . Not on file  Tobacco Use  . Smoking status: Never Smoker  . Smokeless tobacco: Never Used  Vaping Use  . Vaping Use: Never used  Substance and Sexual Activity  . Alcohol use: No    Alcohol/week: 0.0 standard drinks  . Drug use: No  . Sexual activity: Not Currently    Comment: 1st intercourse-20, partners- 1, married- 46 yrs   Other Topics Concern  . Not on file  Social History Narrative   Married, lives with spouse-2 sons and 4 g-kids nearby.    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 Difficulty of Paying Living Expenses: Not hard at all  Food Insecurity: No Food Insecurity  . Worried About Charity fundraiser in the Last Year: Never true  . Ran Out of Food in the Last Year: Never true  Transportation Needs: No Transportation Needs  . Lack of Transportation (Medical): No  . Lack of Transportation (Non-Medical): No  Physical Activity: Sufficiently Active  . Days of Exercise per Week: 5 days  . Minutes of Exercise per Session: 30 min  Stress: No Stress Concern Present  . Feeling of Stress : Only a little  Social Connections: Socially Integrated  . Frequency of Communication with Friends and Family: More than three times a week  . Frequency of Social Gatherings with Friends and Family: More than three times a week  . Attends Religious  Services: More than 4 times per year  . Active Member of Clubs or Organizations: Yes  . Attends Archivist Meetings: More than 4 times per year  . Marital Status: Married    Tobacco Counseling Counseling given: Not Answered   Clinical Intake:  Pre-visit preparation completed: Yes  Pain : No/denies pain     BMI - recorded: 21.44 Nutritional Status: BMI of 19-24  Normal Nutritional Risks: None Diabetes: No  What is the last grade level you completed in school?: HSG  Diabetic? no  Interpreter Needed?: No  Information entered by :: Vondell Babers N. Abbrielle Batts, LPN   Activities of Daily Living In your present state of health, do you have any difficulty performing the following activities: 03/24/2020  Hearing? 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 N  Difficulty concentrating or making decisions? N  Walking or climbing stairs? N  Dressing or bathing? N  Doing errands, shopping? N  Preparing Food and eating ? N  Using the Toilet? N  In the past six months, have you accidently leaked urine? Y  Comment wears a mini pad for protection  Do you have problems with loss of bowel control? N  Managing your Medications? N  Managing your Finances? N  Housekeeping or managing your Housekeeping? N  Some recent data might be hidden    Patient Care Team: Binnie Rail, MD as PCP - General (Internal Medicine) Leta Baptist, MD (Otolaryngology) Jovita Gamma, MD (Neurosurgery) Suella Broad, MD (Physical Medicine and Rehabilitation) Princess Bruins, MD as Consulting Physician (Obstetrics and Gynecology)  Indicate any recent Medical Services you may have received from other than [facility] providers in the past year (date may be approximate).     Assessment:   This is a routine wellness examination for [place].  Hearing/Vision screen No exam data present  Dietary issues and exercise activities discussed: Current Exercise Habits: Home exercise  routine, Type of exercise: walking, Time (Minutes): 30, Frequency (Times/Week): 5, Weekly Exercise (Minutes/Week): 150, Intensity: Moderate, Exercise limited by: None identified  Goals    .  Patient Stated      Exercise more by doing a class at church, eat healthier by increasing fruits and vegetables, continue to travel, enjoy church, and enjoy life.    .  Patient Stated (pt-stated)      To maintain my current health status by continuing to eat healthy, stay physically active and socially active.    .  Stay as healthy and as independent as possible      Continue to eat healthy and exercise, enjoy life, family and worship God      Depression Screen PHQ 2/9 Scores 03/24/2020 03/24/2019 03/19/2018 03/18/2017 02/28/2016 02/27/2015 11/08/2013  PHQ - 2 Score 0 0 0 0 0 0 0  PHQ- 9 Score - - - 2 - - -    Fall Risk Fall Risk  03/24/2020 03/24/2019 12/21/2018 03/19/2018 03/18/2017  Falls in the past year? 0 0 0 No No  Number falls in past yr: 0 0 0 - -  Injury with Fall? 0 0 0 - -    Any stairs in or around the home? No  If so, are there any without handrails? No  Home free of loose throw rugs in walkways, pet beds, electrical cords, etc? Yes  Adequate lighting in your home to reduce risk of falls? Yes   ASSISTIVE DEVICES UTILIZED TO PREVENT FALLS:  Life alert? No  Use of a cane, walker or w/c? No  Grab bars in the bathroom? Yes  Shower chair or bench in shower? No  Elevated toilet seat or a handicapped toilet? No   TIMED UP AND GO:  Was the test performed? No .  Length of time to ambulate 10 feet: 0 sec.   Gait steady and fast without use of assistive device  Cognitive Function: MMSE - Mini Mental State Exam 03/19/2018 03/18/2017  Orientation to time 5 5  Orientation to Place 5 5  Registration 3 3  Attention/ Calculation 5 5  Recall 3 3  Language- name 2 objects 2 2  Language- repeat 1 1  Language- follow 3 step command 3 3  Language- read & follow direction 1 1  Write a sentence 1  1  Copy design 1 1  Total score 30 30     6CIT Screen 03/24/2020  What Year? 0 points  What month? 0 points  What time? 0 points  Count back from 20 0 points  Months in reverse 0 points  Repeat phrase 0 points  Total Score 0    Immunizations Immunization History  Administered Date(s) Administered  . Fluad Quad(high Dose 65+) 03/24/2019  . Hep A / Hep B 02/28/2016, 06/11/2016  . Hepatitis B, adult 04/01/2016  . Influenza Split 04/23/2011, 04/07/2012, 03/29/2013  . Influenza, High Dose Seasonal PF 03/18/2017, 03/19/2018  . Influenza,inj,Quad PF,6+ Mos 02/28/2016  . Moderna SARS-COVID-2 Vaccination 09/20/2019, 10/21/2019  . Pneumococcal Conjugate-13 02/27/2015  . Pneumococcal Polysaccharide-23 07/10/2004, 09/05/2009  . Td 06/19/2003, 11/08/2013  . Zoster 07/08/2005    TDAP status: Up to date Flu Vaccine status: Up to date Pneumococcal vaccine status: Up to date Covid-19 vaccine status: Completed vaccines  Qualifies for Shingles Vaccine? Yes   Zostavax completed Yes   Shingrix Completed?: No.    Education has been provided regarding the importance of this vaccine. Patient has been advised to call insurance company to determine out of pocket expense if they have not yet received this vaccine. Advised may also receive vaccine at local pharmacy or Health Dept. Verbalized acceptance and understanding.  Screening Tests Health Maintenance  Topic Date Due  . INFLUENZA VACCINE  02/06/2020  . COLONOSCOPY  01/01/2021  . MAMMOGRAM  01/16/2022  . DEXA SCAN  01/21/2022  . TETANUS/TDAP  11/09/2023  . COVID-19 Vaccine  Completed  . Hepatitis C Screening  Completed  . PNA vac Low Risk Adult  Completed    Health Maintenance  Health Maintenance Due  Topic Date Due  . INFLUENZA VACCINE  02/06/2020    Colorectal cancer screening: Completed 01/02/2016. Repeat every 5 years Mammogram status: Completed 01/17/2020. Repeat every year Bone Density status: Completed 01/22/2019. Results  reflect: Bone density results: NORMAL. Repeat every 3 years.  Lung Cancer Screening: (Low Dose CT Chest recommended if Age 8-80 years, 30 pack-year currently smoking OR have quit w/in 15years.) does not qualify.   Lung Cancer Screening Referral: no  Additional Screening:  Hepatitis C Screening: does qualify; Completed: yes  Vision Screening: Recommended annual ophthalmology exams for early detection of glaucoma and other disorders of the eye. Is the patient up to date with their annual eye exam?  Yes  Who is the provider or what is the name of the office in which the patient attends annual eye exams? Camillo Flaming, MD If pt is not established with a provider, would they like to be referred to a provider to establish care? No .   Dental Screening: Recommended annual dental exams for proper oral hygiene  Community Resource Referral / Chronic Care Management: CRR required this visit?  No   CCM required this visit?  No      Plan:     I have personally reviewed and noted the following in the patient's chart:   . Medical and social history . Use of alcohol, tobacco or illicit drugs  . Current medications and supplements . Functional ability and status . Nutritional status . Physical activity . Advanced directives . List of other physicians . Hospitalizations, surgeries, and ER visits in previous 12 months . Vitals . Screenings to include cognitive, depression, and falls . Referrals and appointments  In addition, I have reviewed and discussed with patient certain preventive protocols, quality metrics, and best practice recommendations. A written personalized care  plan for preventive services as well as general preventive health recommendations were provided to patient.     Sheral Flow, LPN   8/87/1957   Nurse Notes: n/a

## 2020-03-25 ENCOUNTER — Other Ambulatory Visit: Payer: Self-pay | Admitting: Internal Medicine

## 2020-03-25 DIAGNOSIS — E039 Hypothyroidism, unspecified: Secondary | ICD-10-CM

## 2020-03-25 LAB — CBC WITH DIFFERENTIAL/PLATELET
Absolute Monocytes: 473 cells/uL (ref 200–950)
Basophils Absolute: 38 cells/uL (ref 0–200)
Basophils Relative: 0.6 %
Eosinophils Absolute: 183 cells/uL (ref 15–500)
Eosinophils Relative: 2.9 %
HCT: 43.4 % (ref 35.0–45.0)
Hemoglobin: 14.4 g/dL (ref 11.7–15.5)
Lymphs Abs: 1953 cells/uL (ref 850–3900)
MCH: 31.9 pg (ref 27.0–33.0)
MCHC: 33.2 g/dL (ref 32.0–36.0)
MCV: 96 fL (ref 80.0–100.0)
MPV: 11.9 fL (ref 7.5–12.5)
Monocytes Relative: 7.5 %
Neutro Abs: 3654 cells/uL (ref 1500–7800)
Neutrophils Relative %: 58 %
Platelets: 242 10*3/uL (ref 140–400)
RBC: 4.52 10*6/uL (ref 3.80–5.10)
RDW: 12.4 % (ref 11.0–15.0)
Total Lymphocyte: 31 %
WBC: 6.3 10*3/uL (ref 3.8–10.8)

## 2020-03-25 LAB — COMPLETE METABOLIC PANEL WITH GFR
AG Ratio: 1.8 (calc) (ref 1.0–2.5)
ALT: 12 U/L (ref 6–29)
AST: 12 U/L (ref 10–35)
Albumin: 4.4 g/dL (ref 3.6–5.1)
Alkaline phosphatase (APISO): 48 U/L (ref 37–153)
BUN: 20 mg/dL (ref 7–25)
CO2: 28 mmol/L (ref 20–32)
Calcium: 10.3 mg/dL (ref 8.6–10.4)
Chloride: 101 mmol/L (ref 98–110)
Creat: 0.91 mg/dL (ref 0.60–0.93)
GFR, Est African American: 71 mL/min/{1.73_m2} (ref >=60)
GFR, Est Non African American: 61 mL/min/{1.73_m2} (ref >=60)
Globulin: 2.5 g/dL (calc) (ref 1.9–3.7)
Glucose, Bld: 90 mg/dL (ref 65–99)
Potassium: 4.2 mmol/L (ref 3.5–5.3)
Sodium: 141 mmol/L (ref 135–146)
Total Bilirubin: 0.7 mg/dL (ref 0.2–1.2)
Total Protein: 6.9 g/dL (ref 6.1–8.1)

## 2020-03-25 LAB — LIPID PANEL
Cholesterol: 204 mg/dL — ABNORMAL HIGH (ref <200)
HDL: 64 mg/dL (ref >=50)
LDL Cholesterol (Calc): 120 mg/dL (calc) — ABNORMAL HIGH
Non-HDL Cholesterol (Calc): 140 mg/dL (calc) — ABNORMAL HIGH (ref <130)
Total CHOL/HDL Ratio: 3.2 (calc) (ref <5.0)
Triglycerides: 94 mg/dL (ref <150)

## 2020-03-25 LAB — TSH: TSH: 5.14 mIU/L — ABNORMAL HIGH (ref 0.40–4.50)

## 2020-03-25 MED ORDER — LEVOTHYROXINE SODIUM 50 MCG PO TABS: 50.0000 ug | ORAL_TABLET | Freq: Every day | ORAL | 3 refills | Status: DC

## 2020-05-05 DIAGNOSIS — Z23 Encounter for immunization: Secondary | ICD-10-CM | POA: Diagnosis not present

## 2020-05-15 DIAGNOSIS — H25012 Cortical age-related cataract, left eye: Secondary | ICD-10-CM | POA: Diagnosis not present

## 2020-05-15 DIAGNOSIS — H16223 Keratoconjunctivitis sicca, not specified as Sjogren's, bilateral: Secondary | ICD-10-CM | POA: Diagnosis not present

## 2020-05-15 DIAGNOSIS — H35371 Puckering of macula, right eye: Secondary | ICD-10-CM | POA: Diagnosis not present

## 2020-05-15 DIAGNOSIS — H16143 Punctate keratitis, bilateral: Secondary | ICD-10-CM | POA: Diagnosis not present

## 2020-06-15 ENCOUNTER — Other Ambulatory Visit (INDEPENDENT_AMBULATORY_CARE_PROVIDER_SITE_OTHER): Payer: Medicare Other

## 2020-06-15 ENCOUNTER — Other Ambulatory Visit: Payer: Self-pay

## 2020-06-15 DIAGNOSIS — E039 Hypothyroidism, unspecified: Secondary | ICD-10-CM | POA: Diagnosis not present

## 2020-06-15 LAB — TSH: TSH: 2.22 u[IU]/mL (ref 0.35–4.50)

## 2020-06-15 NOTE — Addendum Note (Signed)
Addended by: Trenda Moots on: 97/03/1503 09:42 AM   Modules accepted: Orders

## 2020-06-23 DIAGNOSIS — H8101 Meniere's disease, right ear: Secondary | ICD-10-CM | POA: Diagnosis not present

## 2020-06-23 DIAGNOSIS — H838X3 Other specified diseases of inner ear, bilateral: Secondary | ICD-10-CM | POA: Diagnosis not present

## 2020-06-23 DIAGNOSIS — H903 Sensorineural hearing loss, bilateral: Secondary | ICD-10-CM | POA: Diagnosis not present

## 2020-06-23 DIAGNOSIS — H6123 Impacted cerumen, bilateral: Secondary | ICD-10-CM | POA: Diagnosis not present

## 2020-06-23 DIAGNOSIS — R42 Dizziness and giddiness: Secondary | ICD-10-CM | POA: Diagnosis not present

## 2020-07-25 DIAGNOSIS — H16223 Keratoconjunctivitis sicca, not specified as Sjogren's, bilateral: Secondary | ICD-10-CM | POA: Diagnosis not present

## 2020-07-25 DIAGNOSIS — H0288B Meibomian gland dysfunction left eye, upper and lower eyelids: Secondary | ICD-10-CM | POA: Diagnosis not present

## 2020-07-25 DIAGNOSIS — H0288A Meibomian gland dysfunction right eye, upper and lower eyelids: Secondary | ICD-10-CM | POA: Diagnosis not present

## 2020-08-14 DIAGNOSIS — H16223 Keratoconjunctivitis sicca, not specified as Sjogren's, bilateral: Secondary | ICD-10-CM | POA: Diagnosis not present

## 2020-08-14 DIAGNOSIS — H0288A Meibomian gland dysfunction right eye, upper and lower eyelids: Secondary | ICD-10-CM | POA: Diagnosis not present

## 2020-08-14 DIAGNOSIS — H0288B Meibomian gland dysfunction left eye, upper and lower eyelids: Secondary | ICD-10-CM | POA: Diagnosis not present

## 2020-11-14 ENCOUNTER — Other Ambulatory Visit: Payer: Self-pay | Admitting: Obstetrics & Gynecology

## 2020-11-14 DIAGNOSIS — Z1231 Encounter for screening mammogram for malignant neoplasm of breast: Secondary | ICD-10-CM

## 2020-11-29 ENCOUNTER — Other Ambulatory Visit: Payer: Self-pay

## 2020-11-29 ENCOUNTER — Ambulatory Visit (AMBULATORY_SURGERY_CENTER): Payer: Self-pay

## 2020-11-29 VITALS — Ht 61.5 in | Wt 114.0 lb

## 2020-11-29 DIAGNOSIS — Z86010 Personal history of colonic polyps: Secondary | ICD-10-CM

## 2020-11-29 MED ORDER — PEG-KCL-NACL-NASULF-NA ASC-C 100 G PO SOLR: 1.0000 | Freq: Once | ORAL | 0 refills | Status: AC

## 2020-11-29 NOTE — Progress Notes (Signed)
No egg or soy allergy known to patient  No issues with past sedation with any surgeries or procedures Patient denies ever being told they had issues or difficulty with intubation  No FH of Malignant Hyperthermia No diet pills per patient No home 02 use per patient  No blood thinners per patient  Pt denies issues with constipation  No A fib or A flutter  EMMI video via MyChart  COVID 19 guidelines implemented in PV today with Pt and RN  Pt is fully vaccinated for Covid x 2; NO PA's for preps discussed with pt in PV today  Discussed with pt there will be an out-of-pocket cost for prep and that varies from $0 to 70 dollars  Due to the COVID-19 pandemic we are asking patients to follow certain guidelines.  Pt aware of COVID protocols and [facility] guidelines

## 2020-11-30 ENCOUNTER — Telehealth: Payer: Self-pay | Admitting: Internal Medicine

## 2020-11-30 NOTE — Chronic Care Management (AMB) (Signed)
  Chronic Care Management   Note  11/30/2020 Name: Holly Richard MRN: [id number] DOB: 24-Jan-1941  Holly Richard is a 80 y.o. year old female who is a primary care patient of Burns, Claudina Lick, MD. I reached out to [facility] by phone today in response to a referral sent by Holly Richard's PCP, Binnie Rail, MD.   Holly Richard was given information about Chronic Care Management services today including:  1. CCM service includes personalized support from designated clinical staff supervised by her physician, including individualized plan of care and coordination with other care providers 2. 24/7 contact phone numbers for assistance for urgent and routine care needs. 3. Service will only be billed when office clinical staff spend 20 minutes or more in a month to coordinate care. 4. Only one practitioner may furnish and bill the service in a calendar month. 5. The patient may stop CCM services at any time (effective at the end of the month) by phone call to the office staff.   Patient wishes to consider information provided and/or speak with a member of the care team before deciding about enrollment in care management services.   Follow up plan:   Lauretta Grill Upstream Scheduler

## 2020-12-13 ENCOUNTER — Ambulatory Visit (AMBULATORY_SURGERY_CENTER): Payer: Medicare Other | Admitting: Internal Medicine

## 2020-12-13 ENCOUNTER — Encounter: Payer: Self-pay | Admitting: Internal Medicine

## 2020-12-13 ENCOUNTER — Other Ambulatory Visit: Payer: Self-pay

## 2020-12-13 VITALS — BP 113/59 | HR 74 | Temp 97.8°F | Resp 14 | Ht 61.5 in | Wt 114.0 lb

## 2020-12-13 DIAGNOSIS — K635 Polyp of colon: Secondary | ICD-10-CM

## 2020-12-13 DIAGNOSIS — Z86010 Personal history of colonic polyps: Secondary | ICD-10-CM | POA: Diagnosis not present

## 2020-12-13 DIAGNOSIS — Z800 Family history of malignant neoplasm of digestive organs: Secondary | ICD-10-CM | POA: Diagnosis not present

## 2020-12-13 DIAGNOSIS — D123 Benign neoplasm of transverse colon: Secondary | ICD-10-CM

## 2020-12-13 DIAGNOSIS — Z1211 Encounter for screening for malignant neoplasm of colon: Secondary | ICD-10-CM | POA: Diagnosis not present

## 2020-12-13 DIAGNOSIS — D122 Benign neoplasm of ascending colon: Secondary | ICD-10-CM

## 2020-12-13 MED ORDER — SODIUM CHLORIDE 0.9 % IV SOLN: 500.0000 mL | Freq: Once | INTRAVENOUS | Status: DC

## 2020-12-13 NOTE — Progress Notes (Signed)
A and O x3. Report to RN. Tolerated MAC anesthesia well.

## 2020-12-13 NOTE — Progress Notes (Signed)
Called to room to assist during endoscopic procedure.  Patient ID and intended procedure confirmed with present staff. Received instructions for my participation in the procedure from the performing physician.  

## 2020-12-13 NOTE — Op Note (Signed)
[place] Patient Name: Holly Richard Procedure Date: 12/13/2020 8:36 AM MRN: [id number] Endoscopist: Docia Chuck. Henrene Pastor , MD Age: 80 Referring MD:  Date of Birth: May 16, 1941 Gender: Female Account #: [id number] Procedure:                Colonoscopy with cold snare polypectomy x 2 Indications:              High risk colon cancer surveillance: Personal                            history of non-advanced adenoma. Family history of                            colon cancer in first-degree relative greater than                            age 55. Previous examinations 2007, 2012, 2017 Medicines:                Monitored Anesthesia Care Procedure:                Pre-Anesthesia Assessment:                           - Prior to the procedure, a History and Physical                            was performed, and patient medications and                            allergies were reviewed. The patient's tolerance of                            previous anesthesia was also reviewed. The risks                            and benefits of the procedure and the sedation                            options and risks were discussed with the patient.                            All questions were answered, and informed consent                            was obtained. Prior Anticoagulants: The patient has                            taken no previous anticoagulant or antiplatelet                            agents. ASA Grade Assessment: II - A patient with                            mild systemic disease. After reviewing the risks  and benefits, the patient was deemed in                            satisfactory condition to undergo the procedure.                           After obtaining informed consent, the colonoscope                            was passed under direct vision. Throughout the                            procedure, the patient's blood pressure, pulse, and                             oxygen saturations were monitored continuously. The                            Olympus PFC-[id number] (#[id number]) Colonoscope was                            introduced through the anus and advanced to the the                            cecum, identified by appendiceal orifice and                            ileocecal valve. The ileocecal valve, appendiceal                            orifice, and rectum were photographed. The quality                            of the bowel preparation was excellent. The                            colonoscopy was performed without difficulty. The                            patient tolerated the procedure well. The bowel                            preparation used was MoviPrep via split dose                            instruction. Scope In: 8:52:22 AM Scope Out: 9:07:09 AM Scope Withdrawal Time: 0 hours 10 minutes 24 seconds  Total Procedure Duration: 0 hours 14 minutes 47 seconds  Findings:                 Two polyps were found in the transverse colon and                            ascending colon. The polyps were 2 to 3 mm in size.  These polyps were removed with a cold snare.                            Resection and retrieval were complete.                           Diverticula were found in the sigmoid colon with                            sigmoid stenosis.                           The exam was otherwise without abnormality on                            direct and retroflexion views. Complications:            No immediate complications. Estimated blood loss:                            None. Estimated Blood Loss:     Estimated blood loss: none. Impression:               - Two 2 to 3 mm polyps in the transverse colon and                            in the ascending colon, removed with a cold snare.                            Resected and retrieved.                           - Diverticulosis in the sigmoid colon with  sigmoid                            stenosis.                           - The examination was otherwise normal on direct                            and retroflexion views. Recommendation:           - Repeat colonoscopy is not recommended for                            surveillance due to current age and favorable                            findings.                           - Patient has a contact number available for                            emergencies. The signs and symptoms of potential  delayed complications were discussed with the                            patient. Return to normal activities tomorrow.                            Written discharge instructions were provided to the                            patient.                           - Resume previous diet.                           - Continue present medications. Docia Chuck. Henrene Pastor, MD 12/13/2020 9:14:55 AM This report has been signed electronically.

## 2020-12-13 NOTE — Progress Notes (Signed)
VS-CW  Pt's states no medical or surgical changes since previsit or office visit.  

## 2020-12-13 NOTE — Patient Instructions (Signed)
Handouts on polyps & diverticulosis given to you today  Await pathology results on polyps removed    YOU HAD AN ENDOSCOPIC PROCEDURE TODAY AT [facility]:   Refer to the procedure report that was given to you for any specific questions about what was found during the examination.  If the procedure report does not answer your questions, please call your gastroenterologist to clarify.  If you requested that your care partner not be given the details of your procedure findings, then the procedure report has been included in a sealed envelope for you to review at your convenience later.  YOU SHOULD EXPECT: Some feelings of bloating in the abdomen. Passage of more gas than usual.  Walking can help get rid of the air that was put into your GI tract during the procedure and reduce the bloating. If you had a lower endoscopy (such as a colonoscopy or flexible sigmoidoscopy) you may notice spotting of blood in your stool or on the toilet paper. If you underwent a bowel prep for your procedure, you may not have a normal bowel movement for a few days.  Please Note:  You might notice some irritation and congestion in your nose or some drainage.  This is from the oxygen used during your procedure.  There is no need for concern and it should clear up in a day or so.  SYMPTOMS TO REPORT IMMEDIATELY:   Following lower endoscopy (colonoscopy or flexible sigmoidoscopy):  Excessive amounts of blood in the stool  Significant tenderness or worsening of abdominal pains  Swelling of the abdomen that is new, acute  Fever of 100F or higher   For urgent or emergent issues, a gastroenterologist can be reached at any hour by calling [phone]. Do not use MyChart messaging for urgent concerns.    DIET:  We do recommend a small meal at first, but then you may proceed to your regular diet.  Drink plenty of fluids but you should avoid alcoholic beverages for 24 hours.  ACTIVITY:  You should plan  to take it easy for the rest of today and you should NOT DRIVE or use heavy machinery until tomorrow (because of the sedation medicines used during the test).    FOLLOW UP: Our staff will call the number listed on your records 48-72 hours following your procedure to check on you and address any questions or concerns that you may have regarding the information given to you following your procedure. If we do not reach you, we will leave a message.  We will attempt to reach you two times.  During this call, we will ask if you have developed any symptoms of COVID 19. If you develop any symptoms (ie: fever, flu-like symptoms, shortness of breath, cough etc.) before then, please call [phone].  If you test positive for Covid 19 in the 2 weeks post procedure, please call and report this information to [place].    If any biopsies were taken you will be contacted by phone or by letter within the next 1-3 weeks.  Please call us at [phone] if you have not heard about the biopsies in 3 weeks.    SIGNATURES/CONFIDENTIALITY: You and/or your care partner have signed paperwork which will be entered into your electronic medical record.  These signatures attest to the fact that that the information above on your After Visit Summary has been reviewed and is understood.  Full responsibility of the confidentiality of this discharge information lies with you and/or  your care-partner. 

## 2020-12-15 ENCOUNTER — Encounter: Payer: Self-pay | Admitting: Internal Medicine

## 2020-12-15 ENCOUNTER — Telehealth: Payer: Self-pay | Admitting: *Deleted

## 2020-12-15 NOTE — Telephone Encounter (Signed)
  Follow up Call-  Call back number 12/13/2020  Post procedure Call Back phone  # [phone]  Permission to leave phone message Yes  Some recent data might be hidden     Patient questions:  Do you have a fever, pain , or abdominal swelling? No. Pain Score  0 *  Have you tolerated food without any problems? Yes.    Have you been able to return to your normal activities? Yes.    Do you have any questions about your discharge instructions: Diet   No. Medications  No. Follow up visit  No.  Do you have questions or concerns about your Care? No.  Actions: * If pain score is 4 or above: No action needed, pain <4.  Have you developed a fever since your procedure? no  2.   Have you had an respiratory symptoms (SOB or cough) since your procedure? no  3.   Have you tested positive for COVID 19 since your procedure no  4.   Have you had any family members/close contacts diagnosed with the COVID 19 since your procedure?  no   If yes to any of these questions please route to Joylene John, RN and Joella Prince, RN

## 2020-12-26 ENCOUNTER — Encounter: Payer: Self-pay | Admitting: Obstetrics & Gynecology

## 2020-12-26 ENCOUNTER — Other Ambulatory Visit: Payer: Self-pay

## 2020-12-26 ENCOUNTER — Ambulatory Visit (INDEPENDENT_AMBULATORY_CARE_PROVIDER_SITE_OTHER): Payer: Medicare Other | Admitting: Obstetrics & Gynecology

## 2020-12-26 VITALS — BP 104/68 | Ht 61.0 in | Wt 118.0 lb

## 2020-12-26 DIAGNOSIS — Z01419 Encounter for gynecological examination (general) (routine) without abnormal findings: Secondary | ICD-10-CM

## 2020-12-26 DIAGNOSIS — Z780 Asymptomatic menopausal state: Secondary | ICD-10-CM

## 2020-12-26 DIAGNOSIS — M8589 Other specified disorders of bone density and structure, multiple sites: Secondary | ICD-10-CM

## 2020-12-26 NOTE — Progress Notes (Signed)
Holly Richard March 29, 1941 [id number]   History:    80 y.o. G3P2A1L2  Married.  GM of 4.   RP:  Established patient presenting for annual gyn exam   HPI: Postmenopause on no hormone replacement therapy.  No postmenopausal bleeding.  No pelvic pain.  Sciatica, taking Diflifenac 50 mg tab daily.  Urine and bowel movements normal.  Abstinent.  Last Pap Neg in 2020.  Breasts normal.  Body mass index 22.3.  Active lifestyle.  Osteopenia BD 01/2019.  Health labs with family physician.  Colon 2017.     Past medical history,surgical history, family history and social history were all reviewed and documented in the EPIC chart.  Gynecologic History No LMP recorded. Patient is postmenopausal.  Obstetric History OB History  Gravida Para Term Preterm AB Living  3 2     1 2   SAB IAB Ectopic Multiple Live Births  1            # Outcome Date GA Lbr Len/2nd Weight Sex Delivery Anes PTL Lv  3 SAB           2 Para           1 Para              ROS: A ROS was performed and pertinent positives and negatives are included in the history.  GENERAL: No fevers or chills. HEENT: No change in vision, no earache, sore throat or sinus congestion. NECK: No pain or stiffness. CARDIOVASCULAR: No chest pain or pressure. No palpitations. PULMONARY: No shortness of breath, cough or wheeze. GASTROINTESTINAL: No abdominal pain, nausea, vomiting or diarrhea, melena or bright red blood per rectum. GENITOURINARY: No urinary frequency, urgency, hesitancy or dysuria. MUSCULOSKELETAL: No joint or muscle pain, no back pain, no recent trauma. DERMATOLOGIC: No rash, no itching, no lesions. ENDOCRINE: No polyuria, polydipsia, no heat or cold intolerance. No recent change in weight. HEMATOLOGICAL: No anemia or easy bruising or bleeding. NEUROLOGIC: No headache, seizures, numbness, tingling or weakness. PSYCHIATRIC: No depression, no loss of interest in normal activity or change in sleep pattern.     Exam:   BP 104/68   Ht  5\' 1"  (1.549 m)   Wt 118 lb (53.5 kg)   BMI 22.30 kg/m   Body mass index is 22.3 kg/m.  General appearance : Well developed well nourished female. No acute distress HEENT: Eyes: no retinal hemorrhage or exudates,  Neck supple, trachea midline, no carotid bruits, no thyroidmegaly Lungs: Clear to auscultation, no rhonchi or wheezes, or rib retractions  Heart: Regular rate and rhythm, no murmurs or gallops Breast:Examined in sitting and supine position were symmetrical in appearance, no palpable masses or tenderness,  no skin retraction, no nipple inversion, no nipple discharge, no skin discoloration, no axillary or supraclavicular lymphadenopathy Abdomen: no palpable masses or tenderness, no rebound or guarding Extremities: no edema or skin discoloration or tenderness  Pelvic: Vulva: Normal             Vagina: No gross lesions or discharge  Cervix: No gross lesions or discharge  Uterus  AV, normal size, shape and consistency, non-tender and mobile  Adnexa  Without masses or tenderness  Anus: Normal   Assessment/Plan:  80 y.o. female for annual exam   1. Well female exam with routine gynecological exam Normal gynecologic exam in postmenopausal.  Pap test in 2020 was negative, no indication to repeat a Pap test at this time.  Breast exam normal.  Screening mammogram July  2021 was negative.  Colonoscopy in 2017.  Health labs with family physician.  Good body mass index at 22.3.  Active lifestyle and healthy nutrition.  2. Postmenopause Well on no hormone replacement therapy.  No postmenopausal bleeding.  3. Osteopenia of multiple sites  Last bone density July 2020 showed osteopenia with the lowest T score at the left total hip at -1.7.  Patient is on vitamin D supplements, calcium intake of 1.5 g/day total and regular weightbearing physical activities.  Decision to wait until next year for repeat bone density, will do a bone density July 2023.  Princess Bruins MD, 10:37 AM  12/26/2020

## 2021-01-02 DIAGNOSIS — H838X3 Other specified diseases of inner ear, bilateral: Secondary | ICD-10-CM | POA: Diagnosis not present

## 2021-01-02 DIAGNOSIS — H8101 Meniere's disease, right ear: Secondary | ICD-10-CM | POA: Diagnosis not present

## 2021-01-02 DIAGNOSIS — H903 Sensorineural hearing loss, bilateral: Secondary | ICD-10-CM | POA: Diagnosis not present

## 2021-01-15 ENCOUNTER — Other Ambulatory Visit: Payer: Self-pay | Admitting: Obstetrics & Gynecology

## 2021-01-15 DIAGNOSIS — R921 Mammographic calcification found on diagnostic imaging of breast: Secondary | ICD-10-CM

## 2021-01-18 ENCOUNTER — Ambulatory Visit
Admission: RE | Admit: 2021-01-18 | Discharge: 2021-01-18 | Disposition: A | Discharge status: Home / Self Care | Payer: Medicare Other | Source: Ambulatory Visit | Attending: Obstetrics & Gynecology | Admitting: Obstetrics & Gynecology

## 2021-01-18 ENCOUNTER — Other Ambulatory Visit: Payer: Self-pay | Admitting: Obstetrics & Gynecology

## 2021-01-18 ENCOUNTER — Other Ambulatory Visit: Payer: Self-pay

## 2021-01-18 DIAGNOSIS — R921 Mammographic calcification found on diagnostic imaging of breast: Secondary | ICD-10-CM

## 2021-01-18 DIAGNOSIS — R922 Inconclusive mammogram: Secondary | ICD-10-CM | POA: Diagnosis not present

## 2021-01-24 DIAGNOSIS — M4180 Other forms of scoliosis, site unspecified: Secondary | ICD-10-CM | POA: Diagnosis not present

## 2021-01-26 ENCOUNTER — Ambulatory Visit
Admission: RE | Admit: 2021-01-26 | Discharge: 2021-01-26 | Disposition: A | Discharge status: Home / Self Care | Payer: Medicare Other | Source: Ambulatory Visit | Attending: Obstetrics & Gynecology | Admitting: Obstetrics & Gynecology

## 2021-01-26 ENCOUNTER — Other Ambulatory Visit: Payer: Self-pay

## 2021-01-26 DIAGNOSIS — R921 Mammographic calcification found on diagnostic imaging of breast: Secondary | ICD-10-CM

## 2021-01-26 DIAGNOSIS — N6012 Diffuse cystic mastopathy of left breast: Secondary | ICD-10-CM | POA: Diagnosis not present

## 2021-01-29 DIAGNOSIS — H903 Sensorineural hearing loss, bilateral: Secondary | ICD-10-CM | POA: Diagnosis not present

## 2021-01-29 DIAGNOSIS — H8103 Meniere's disease, bilateral: Secondary | ICD-10-CM | POA: Diagnosis not present

## 2021-01-29 DIAGNOSIS — H838X3 Other specified diseases of inner ear, bilateral: Secondary | ICD-10-CM | POA: Diagnosis not present

## 2021-02-09 DIAGNOSIS — H838X3 Other specified diseases of inner ear, bilateral: Secondary | ICD-10-CM | POA: Diagnosis not present

## 2021-02-09 DIAGNOSIS — H8103 Meniere's disease, bilateral: Secondary | ICD-10-CM | POA: Diagnosis not present

## 2021-02-09 DIAGNOSIS — H9122 Sudden idiopathic hearing loss, left ear: Secondary | ICD-10-CM | POA: Diagnosis not present

## 2021-02-22 DIAGNOSIS — Z23 Encounter for immunization: Secondary | ICD-10-CM | POA: Diagnosis not present

## 2021-03-23 ENCOUNTER — Other Ambulatory Visit: Payer: Self-pay

## 2021-03-25 ENCOUNTER — Encounter: Payer: Self-pay | Admitting: Internal Medicine

## 2021-03-25 NOTE — Patient Instructions (Addendum)
  Blood work was ordered.      Medications changes include :   none    Please followup in 1 year

## 2021-03-25 NOTE — Progress Notes (Signed)
Subjective:    Patient ID: Holly Richard, female    DOB: 1941-07-01, 80 y.o.   MRN: [id number]  This visit occurred during the SARS-CoV-2 public health emergency.  Safety protocols were in place, including screening questions prior to the visit, additional usage of staff PPE, and extensive cleaning of exam room while observing appropriate contact time as indicated for disinfecting solutions.     HPI The patient is here for follow up of their chronic medical problems, including hypothyroid, hichol, meniere's dz, osteopenia, chronic lower back pain   She does her yard work and walk some.   Medications and allergies reviewed with patient and updated if appropriate.  Patient Active Problem List   Diagnosis Date Noted   LLQ abdominal pain, chronic, intermittent 07/24/2018   Lumbar stenosis 12/28/2014   Hepatitis C antibody test positive - false positive 12/07/2014   Hypothyroidism 07/26/2010   Hyperlipidemia 07/26/2010   MIGRAINE HEADACHE 07/26/2010   Meniere's disease 07/26/2010   ARTHRITIS 07/26/2010   ALLERGIC RHINITIS 07/05/2010   DIVERTICULOSIS, COLON 07/05/2010   Osteopenia 07/05/2010    Current Outpatient Medications on File Prior to Visit  Medication Sig Dispense Refill   acetaminophen (TYLENOL) 500 MG tablet Take 500 mg by mouth every 6 (six) hours as needed for mild pain or moderate pain.     Apoaequorin (PREVAGEN PO) Take 1 tablet by mouth daily at 6 (six) AM.     CALCIUM-VITAMIN D PO Take 1 tablet by mouth daily.      cycloSPORINE (RESTASIS OP) Apply 1 drop to eye 2 (two) times daily.     diclofenac (VOLTAREN) 75 MG EC tablet Take 75 mg by mouth 2 (two) times daily as needed.     fluticasone (FLONASE) 50 MCG/ACT nasal spray Place 1 spray into both nostrils daily as needed for allergies or rhinitis.     levothyroxine (SYNTHROID) 50 MCG tablet Take 1 tablet (50 mcg total) by mouth daily. 90 tablet 3   Multiple Vitamins-Minerals (QC WOMENS DAILY MULTIVITAMIN)  TABS Take 2 tablets by mouth daily.     Omega-3 Fatty Acids (FISH OIL TRIPLE STRENGTH) 1400 MG CAPS Take 1 tablet by mouth daily.      Polyethyl Glycol-Propyl Glycol 0.4-0.3 % SOLN Apply 1 drop to eye 2 (two) times daily. Regener eye drops     triamterene-hydrochlorothiazide (MAXZIDE-25) 37.5-25 MG per tablet Take 1 tablet by mouth daily.     UNABLE TO FIND instaflex joint support take 1 tablet a day     No current facility-administered medications on file prior to visit.    Past Medical History:  Diagnosis Date   ALLERGIC RHINITIS    ARTHRITIS    Arthritis    LEFT wrist/back   DIVERTICULOSIS, COLON    Hepatitis C antibody test positive 11/2014   negative RNA confirmation test   HYPERLIPIDEMIA    diet controlled   HYPOTHYROIDISM    on meds   MENIERE'S DISEASE    MIGRAINE HEADACHE    OSTEOPENIA     Past Surgical History:  Procedure Laterality Date   (R) Ear implants  10/2008   Hearing loss-coclear implant    BUNIONECTOMY     X's - (R) 2/04 & (L) 3/04   COLONOSCOPY  2017   JP-MAC-suprep(exc)-TA x 1   LUMBAR LAMINECTOMY/DECOMPRESSION MICRODISCECTOMY Left 12/28/2014   Procedure: LUMBAR LAMINECTOMY/DECOMPRESSION MICRODISCECTOMY 2 LEVELS;  Surgeon: Jovita Gamma, MD;  Location: [facility];  Service: Neurosurgery;  Laterality: Left;  Left L34 laminotomy,  foraminotomy and poss microdiskectomy with left L45 laminotomy, foraminotomy, resection of synovial cyst and poss microdiskectomy   POLYPECTOMY  2017   TA x 1   TONSILLECTOMY  1954   WISDOM TOOTH EXTRACTION      Social History   Socioeconomic History   Marital status: Married    Spouse name: Not on file   Number of children: 2   Years of education: Not on file   Highest education level: Not on file  Occupational History   Not on file  Tobacco Use   Smoking status: Never   Smokeless tobacco: Never  Vaping Use   Vaping Use: Never used  Substance and Sexual Activity   Alcohol use: No    Alcohol/week: 0.0 standard  drinks   Drug use: No   Sexual activity: Not Currently    Comment: 1st intercourse-20, partners- 66, married- 44 yrs   Other Topics Concern   Not on file  Social History Narrative   Married, lives with spouse-2 sons and 4 g-kids nearby.    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   Social Determinants of Radio broadcast assistant Strain: Not on [employer] Insecurity: Not on file  Transportation Needs: Not on file  Physical Activity: Not on file  Stress: Not on file  Social Connections: Not on file    Family History  Problem Relation Age of Onset   Arthritis Mother    Diabetes Mother    Heart disease Mother    Stroke Mother    Colon cancer Father 6   Stroke Father    Breast cancer Sister    Diabetes Sister    Lung cancer Sister    Diabetes Brother    Prostate cancer Brother    Colon cancer Brother    Brain cancer Other        Nephew   Colon polyps Neg Hx    Rectal cancer Neg Hx    Stomach cancer Neg Hx     Review of Systems  Constitutional:  Negative for fatigue and fever.  HENT:  Positive for hearing loss.   Respiratory:  Negative for Richard, shortness of breath and wheezing.   Cardiovascular:  Negative for chest pain, palpitations and leg swelling.  Neurological:  Positive for headaches (occ). Negative for dizziness and light-headedness.      Objective:   Vitals:   03/26/21 0750  BP: 110/72  Pulse: 72  Temp: 98 F (36.7 C)  SpO2: 97%   BP Readings from Last 3 Encounters:  03/26/21 110/72  12/26/20 104/68  12/13/20 (!) 113/59   Wt Readings from Last 3 Encounters:  03/26/21 115 lb (52.2 kg)  12/26/20 118 lb (53.5 kg)  12/13/20 114 lb (51.7 kg)   Body mass index is 21.73 kg/m.   Physical Exam    Constitutional: Appears well-developed and well-nourished. No distress.  HENT:  Head: Normocephalic and atraumatic.  Neck: Neck supple. No tracheal deviation present. No thyromegaly present.  No cervical lymphadenopathy Cardiovascular: Normal  rate, regular rhythm and normal heart sounds.   No murmur heard. No carotid bruit .  No edema Pulmonary/Chest: Effort normal and breath sounds normal. No respiratory distress. No has no wheezes. No rales.  Abdomen: soft, NT, ND Skin: Skin is warm and dry. Not diaphoretic.  Psychiatric: Normal mood and affect. Behavior is normal.    The 10-year ASCVD risk score (Arnett DK, et al., 2019) is: 19.9%   Values used to calculate the score:  Age: 75 years     Sex: Female     Is Non-Hispanic African American: No     Diabetic: No     Tobacco smoker: No     Systolic Blood Pressure: 217 mmHg     Is BP treated: Yes     HDL Cholesterol: 64 mg/dL     Total Cholesterol: 204 mg/dL    Assessment & Plan:    See Problem List for Assessment and Plan of chronic medical problems.

## 2021-03-26 ENCOUNTER — Other Ambulatory Visit: Payer: Self-pay | Admitting: Internal Medicine

## 2021-03-26 ENCOUNTER — Ambulatory Visit (INDEPENDENT_AMBULATORY_CARE_PROVIDER_SITE_OTHER): Payer: Medicare Other | Admitting: Internal Medicine

## 2021-03-26 ENCOUNTER — Other Ambulatory Visit: Payer: Self-pay

## 2021-03-26 VITALS — BP 110/72 | HR 72 | Temp 98.0°F | Ht 61.0 in | Wt 115.0 lb

## 2021-03-26 DIAGNOSIS — H8109 Meniere's disease, unspecified ear: Secondary | ICD-10-CM | POA: Diagnosis not present

## 2021-03-26 DIAGNOSIS — E039 Hypothyroidism, unspecified: Secondary | ICD-10-CM | POA: Diagnosis not present

## 2021-03-26 DIAGNOSIS — E782 Mixed hyperlipidemia: Secondary | ICD-10-CM

## 2021-03-26 DIAGNOSIS — Z1159 Encounter for screening for other viral diseases: Secondary | ICD-10-CM

## 2021-03-26 DIAGNOSIS — R768 Other specified abnormal immunological findings in serum: Secondary | ICD-10-CM

## 2021-03-26 LAB — COMPREHENSIVE METABOLIC PANEL
ALT: 23 U/L (ref 0–35)
AST: 22 U/L (ref 0–37)
Albumin: 4.3 g/dL (ref 3.5–5.2)
Alkaline Phosphatase: 54 U/L (ref 39–117)
BUN: 26 mg/dL — ABNORMAL HIGH (ref 6–23)
CO2: 31 mEq/L (ref 19–32)
Calcium: 10 mg/dL (ref 8.4–10.5)
Chloride: 102 mEq/L (ref 96–112)
Creatinine, Ser: 0.99 mg/dL (ref 0.40–1.20)
GFR: 54.83 mL/min — ABNORMAL LOW (ref >60.00)
Glucose, Bld: 87 mg/dL (ref 70–99)
Potassium: 3.7 mEq/L (ref 3.5–5.1)
Sodium: 141 mEq/L (ref 135–145)
Total Bilirubin: 0.6 mg/dL (ref 0.2–1.2)
Total Protein: 7.2 g/dL (ref 6.0–8.3)

## 2021-03-26 LAB — CBC WITH DIFFERENTIAL/PLATELET
Basophils Absolute: 0.1 10*3/uL (ref 0.0–0.1)
Basophils Relative: 0.9 % (ref 0.0–3.0)
Eosinophils Absolute: 0.1 10*3/uL (ref 0.0–0.7)
Eosinophils Relative: 2.2 % (ref 0.0–5.0)
HCT: 42 % (ref 36.0–46.0)
Hemoglobin: 14.2 g/dL (ref 12.0–15.0)
Lymphocytes Relative: 32 % (ref 12.0–46.0)
Lymphs Abs: 1.8 10*3/uL (ref 0.7–4.0)
MCHC: 33.7 g/dL (ref 30.0–36.0)
MCV: 93.3 fl (ref 78.0–100.0)
Monocytes Absolute: 0.5 10*3/uL (ref 0.1–1.0)
Monocytes Relative: 8.1 % (ref 3.0–12.0)
Neutro Abs: 3.2 10*3/uL (ref 1.4–7.7)
Neutrophils Relative %: 56.8 % (ref 43.0–77.0)
Platelets: 212 10*3/uL (ref 150.0–400.0)
RBC: 4.51 Mil/uL (ref 3.87–5.11)
RDW: 14 % (ref 11.5–15.5)
WBC: 5.6 10*3/uL (ref 4.0–10.5)

## 2021-03-26 LAB — LIPID PANEL
Cholesterol: 225 mg/dL — ABNORMAL HIGH (ref 0–200)
HDL: 67.8 mg/dL (ref >39.00)
LDL Cholesterol: 143 mg/dL — ABNORMAL HIGH (ref 0–99)
NonHDL: 157.35
Total CHOL/HDL Ratio: 3
Triglycerides: 73 mg/dL (ref 0.0–149.0)
VLDL: 14.6 mg/dL (ref 0.0–40.0)

## 2021-03-26 LAB — TSH: TSH: 1.66 u[IU]/mL (ref 0.35–5.50)

## 2021-03-26 NOTE — Assessment & Plan Note (Signed)
Chronic On maxzide Managed by ENT

## 2021-03-26 NOTE — Assessment & Plan Note (Signed)
Positive at blood draw and once here - then three negative results Will recheck today to confirm negative

## 2021-03-26 NOTE — Addendum Note (Signed)
Addended by: Boris Lown B on: 03/26/2021 08:21 AM   Modules accepted: Orders

## 2021-03-26 NOTE — Assessment & Plan Note (Signed)
Chronic  Clinically euthyroid Currently taking levothyroxine 50 mcg qd Check tsh  Titrate med dose if needed

## 2021-03-26 NOTE — Assessment & Plan Note (Addendum)
Chronic Check lipid panel, cmp Currently lifestyle controlled Elevated ascvd risk - discussed starting a statin - she deferred Regular exercise and healthy diet encouraged

## 2021-03-27 LAB — HEPATITIS C ANTIBODY
Hepatitis C Ab: NONREACTIVE
SIGNAL TO CUT-OFF: 0.07 (ref <1.00)

## 2021-05-02 DIAGNOSIS — Z23 Encounter for immunization: Secondary | ICD-10-CM | POA: Diagnosis not present

## 2021-05-17 DIAGNOSIS — H8101 Meniere's disease, right ear: Secondary | ICD-10-CM | POA: Diagnosis not present

## 2021-05-17 DIAGNOSIS — H9041 Sensorineural hearing loss, unilateral, right ear, with unrestricted hearing on the contralateral side: Secondary | ICD-10-CM | POA: Diagnosis not present

## 2021-05-17 DIAGNOSIS — H838X1 Other specified diseases of right inner ear: Secondary | ICD-10-CM | POA: Diagnosis not present

## 2021-05-18 ENCOUNTER — Telehealth: Payer: Self-pay | Admitting: Internal Medicine

## 2021-05-18 DIAGNOSIS — H16223 Keratoconjunctivitis sicca, not specified as Sjogren's, bilateral: Secondary | ICD-10-CM | POA: Diagnosis not present

## 2021-05-18 DIAGNOSIS — H35371 Puckering of macula, right eye: Secondary | ICD-10-CM | POA: Diagnosis not present

## 2021-05-18 DIAGNOSIS — H35342 Macular cyst, hole, or pseudohole, left eye: Secondary | ICD-10-CM | POA: Diagnosis not present

## 2021-05-18 DIAGNOSIS — H25012 Cortical age-related cataract, left eye: Secondary | ICD-10-CM | POA: Diagnosis not present

## 2021-05-18 NOTE — Telephone Encounter (Signed)
LVM for pt to rtn my call to schedule AWV with [facility]. Please schedule AWV if pt calls the office  

## 2021-06-12 ENCOUNTER — Other Ambulatory Visit: Payer: Self-pay | Admitting: Obstetrics & Gynecology

## 2021-06-12 DIAGNOSIS — N6012 Diffuse cystic mastopathy of left breast: Secondary | ICD-10-CM

## 2021-06-20 DIAGNOSIS — H35373 Puckering of macula, bilateral: Secondary | ICD-10-CM | POA: Diagnosis not present

## 2021-06-20 DIAGNOSIS — H3581 Retinal edema: Secondary | ICD-10-CM | POA: Diagnosis not present

## 2021-06-20 DIAGNOSIS — H43813 Vitreous degeneration, bilateral: Secondary | ICD-10-CM | POA: Diagnosis not present

## 2021-06-20 DIAGNOSIS — H2513 Age-related nuclear cataract, bilateral: Secondary | ICD-10-CM | POA: Diagnosis not present

## 2021-06-21 ENCOUNTER — Other Ambulatory Visit: Payer: Self-pay | Admitting: Internal Medicine

## 2021-07-31 ENCOUNTER — Other Ambulatory Visit: Payer: Self-pay | Admitting: Obstetrics & Gynecology

## 2021-07-31 ENCOUNTER — Ambulatory Visit
Admission: RE | Admit: 2021-07-31 | Discharge: 2021-07-31 | Disposition: A | Discharge status: Home / Self Care | Payer: Medicare Other | Source: Ambulatory Visit | Attending: Obstetrics & Gynecology | Admitting: Obstetrics & Gynecology

## 2021-07-31 ENCOUNTER — Ambulatory Visit: Payer: Medicare Other

## 2021-07-31 DIAGNOSIS — R921 Mammographic calcification found on diagnostic imaging of breast: Secondary | ICD-10-CM

## 2021-07-31 DIAGNOSIS — N6012 Diffuse cystic mastopathy of left breast: Secondary | ICD-10-CM

## 2021-07-31 DIAGNOSIS — R922 Inconclusive mammogram: Secondary | ICD-10-CM | POA: Diagnosis not present

## 2021-08-09 ENCOUNTER — Ambulatory Visit
Admission: RE | Admit: 2021-08-09 | Discharge: 2021-08-09 | Disposition: A | Discharge status: Home / Self Care | Payer: Medicare Other | Source: Ambulatory Visit | Attending: Obstetrics & Gynecology | Admitting: Obstetrics & Gynecology

## 2021-08-09 DIAGNOSIS — R921 Mammographic calcification found on diagnostic imaging of breast: Secondary | ICD-10-CM

## 2021-08-09 DIAGNOSIS — N6012 Diffuse cystic mastopathy of left breast: Secondary | ICD-10-CM | POA: Diagnosis not present

## 2021-08-14 DIAGNOSIS — Z23 Encounter for immunization: Secondary | ICD-10-CM | POA: Diagnosis not present

## 2021-09-11 DIAGNOSIS — H2512 Age-related nuclear cataract, left eye: Secondary | ICD-10-CM | POA: Diagnosis not present

## 2021-09-11 DIAGNOSIS — H35371 Puckering of macula, right eye: Secondary | ICD-10-CM | POA: Diagnosis not present

## 2021-09-11 DIAGNOSIS — H35342 Macular cyst, hole, or pseudohole, left eye: Secondary | ICD-10-CM | POA: Diagnosis not present

## 2021-09-27 ENCOUNTER — Other Ambulatory Visit: Payer: Self-pay | Admitting: Internal Medicine

## 2021-11-15 DIAGNOSIS — H838X3 Other specified diseases of inner ear, bilateral: Secondary | ICD-10-CM | POA: Diagnosis not present

## 2021-11-15 DIAGNOSIS — H6123 Impacted cerumen, bilateral: Secondary | ICD-10-CM | POA: Diagnosis not present

## 2021-11-15 DIAGNOSIS — H8101 Meniere's disease, right ear: Secondary | ICD-10-CM | POA: Diagnosis not present

## 2021-11-15 DIAGNOSIS — H903 Sensorineural hearing loss, bilateral: Secondary | ICD-10-CM | POA: Diagnosis not present

## 2021-12-05 ENCOUNTER — Other Ambulatory Visit: Payer: Self-pay | Admitting: Obstetrics & Gynecology

## 2021-12-05 DIAGNOSIS — Z1231 Encounter for screening mammogram for malignant neoplasm of breast: Secondary | ICD-10-CM

## 2021-12-25 ENCOUNTER — Other Ambulatory Visit: Payer: Self-pay | Admitting: Internal Medicine

## 2021-12-28 ENCOUNTER — Ambulatory Visit (INDEPENDENT_AMBULATORY_CARE_PROVIDER_SITE_OTHER): Payer: Medicare Other | Admitting: Obstetrics & Gynecology

## 2021-12-28 ENCOUNTER — Encounter: Payer: Self-pay | Admitting: Obstetrics & Gynecology

## 2021-12-28 VITALS — BP 110/72 | HR 74 | Ht 60.75 in | Wt 118.0 lb

## 2021-12-28 DIAGNOSIS — Z8619 Personal history of other infectious and parasitic diseases: Secondary | ICD-10-CM | POA: Diagnosis not present

## 2021-12-28 DIAGNOSIS — Z01419 Encounter for gynecological examination (general) (routine) without abnormal findings: Secondary | ICD-10-CM

## 2021-12-28 DIAGNOSIS — M8589 Other specified disorders of bone density and structure, multiple sites: Secondary | ICD-10-CM

## 2021-12-28 DIAGNOSIS — Z780 Asymptomatic menopausal state: Secondary | ICD-10-CM

## 2021-12-28 DIAGNOSIS — Z9289 Personal history of other medical treatment: Secondary | ICD-10-CM

## 2022-01-14 DIAGNOSIS — H25012 Cortical age-related cataract, left eye: Secondary | ICD-10-CM | POA: Diagnosis not present

## 2022-01-14 DIAGNOSIS — H35342 Macular cyst, hole, or pseudohole, left eye: Secondary | ICD-10-CM | POA: Diagnosis not present

## 2022-01-14 DIAGNOSIS — H2512 Age-related nuclear cataract, left eye: Secondary | ICD-10-CM | POA: Diagnosis not present

## 2022-01-21 ENCOUNTER — Ambulatory Visit
Admission: RE | Admit: 2022-01-21 | Discharge: 2022-01-21 | Disposition: A | Discharge status: Home / Self Care | Payer: Medicare Other | Source: Ambulatory Visit | Attending: Obstetrics & Gynecology | Admitting: Obstetrics & Gynecology

## 2022-01-21 DIAGNOSIS — Z1231 Encounter for screening mammogram for malignant neoplasm of breast: Secondary | ICD-10-CM | POA: Diagnosis not present

## 2022-01-22 ENCOUNTER — Other Ambulatory Visit: Payer: Self-pay | Admitting: Obstetrics & Gynecology

## 2022-01-22 ENCOUNTER — Ambulatory Visit (INDEPENDENT_AMBULATORY_CARE_PROVIDER_SITE_OTHER): Payer: Medicare Other

## 2022-01-22 DIAGNOSIS — M8589 Other specified disorders of bone density and structure, multiple sites: Secondary | ICD-10-CM

## 2022-01-22 DIAGNOSIS — Z13820 Encounter for screening for osteoporosis: Secondary | ICD-10-CM | POA: Diagnosis not present

## 2022-01-22 DIAGNOSIS — Z780 Asymptomatic menopausal state: Secondary | ICD-10-CM | POA: Diagnosis not present

## 2022-02-06 ENCOUNTER — Encounter: Payer: Self-pay | Admitting: Obstetrics & Gynecology

## 2022-02-06 ENCOUNTER — Ambulatory Visit (INDEPENDENT_AMBULATORY_CARE_PROVIDER_SITE_OTHER): Payer: Medicare Other | Admitting: Obstetrics & Gynecology

## 2022-02-06 VITALS — BP 104/62 | HR 78

## 2022-02-06 DIAGNOSIS — Z9189 Other specified personal risk factors, not elsewhere classified: Secondary | ICD-10-CM

## 2022-02-06 DIAGNOSIS — M8589 Other specified disorders of bone density and structure, multiple sites: Secondary | ICD-10-CM | POA: Diagnosis not present

## 2022-02-06 NOTE — Progress Notes (Signed)
    Holly Richard March 21, 1941 [id number]        80 y.o.  [id number]   RP: Counseling and management of Osteopenia with increased FRAX at the Hip  HPI: Osteopenia with T-Score -1.8 at the Lt Femoral Neck with elevated FRAX at 3.4% at the Hip.  No recent fall.  Walking daily.  Taking Vit D and Ca++ supplements.  Mother with Osteoporosis.   OB History  Gravida Para Term Preterm AB Living  '3 2 2   1 2  '$ SAB IAB Ectopic Multiple Live Births  1       2    # Outcome Date GA Lbr Len/2nd Weight Sex Delivery Anes PTL Lv  3 SAB           2 Term           1 Term             Past medical history,surgical history, problem list, medications, allergies, family history and social history were all reviewed and documented in the EPIC chart.   Directed ROS with pertinent positives and negatives documented in the history of present illness/assessment and plan.  Exam:  Vitals:   02/06/22 1050  BP: 104/62  Pulse: 78  SpO2: 99%   General appearance:  Normal  Bone Density:  Osteopenia T-Score -1.8 at the Lt Femoral Neck.  Frax 13% overall and 3.4% at the hip.  Osteopenia at all sites.  Stable BD c/w 2020.     Assessment/Plan:  81 y.o. G3P2012   1. Osteopenia of multiple sites Osteopenia with T-Score -1.8 at the Lt Femoral Neck with elevated FRAX at 3.4% at the Hip. Osteopenia at all sites.  Stable BD c/w 2020.  No recent fall.  Walking daily.  Taking Vit D and Ca++ supplements.  Mother with Osteoporosis.  Will check Vit D level today and adjust supplements accordingly.  Will calculate her total Ca++ intake to reach 1500 mg daily.  Encouraged to increase her weight bearing activities and add small weight lifting.   - Vitamin D (25 hydroxy)  2. Fracture Risk Assessment Score (FRAX) indicating greater than 3% risk for hip fracture FRAX elevated at 3.4% at the Hip.  Counseling done. - Vitamin D (25 hydroxy)  Other orders - CALCIUM PO; Take by mouth.   Counseling and management of Osteopenia  with elevated FRAX at the Hip, review of documentation, for 25 minutes.  Princess Bruins MD, 10:53 AM 02/06/2022

## 2022-02-07 LAB — VITAMIN D 25 HYDROXY (VIT D DEFICIENCY, FRACTURES): Vit D, 25-Hydroxy: 42 ng/mL (ref 30–100)

## 2022-03-06 DIAGNOSIS — M5416 Radiculopathy, lumbar region: Secondary | ICD-10-CM | POA: Diagnosis not present

## 2022-03-06 DIAGNOSIS — M4155 Other secondary scoliosis, thoracolumbar region: Secondary | ICD-10-CM | POA: Diagnosis not present

## 2022-03-26 ENCOUNTER — Encounter: Payer: Self-pay | Admitting: Internal Medicine

## 2022-03-26 NOTE — Patient Instructions (Addendum)
     Blood work was ordered.     Medications changes include :   none   Your prescription(s) have been sent to your pharmacy.     Return in about 1 year (around 03/28/2023) for follow up.

## 2022-03-26 NOTE — Progress Notes (Unsigned)
Subjective:    Patient ID: Holly Richard, female    DOB: May 17, 1941, 81 y.o.   MRN: [id number]     HPI Holly Richard is here for follow up of her chronic medical problems, including hypothyroidism, hyperlipidemia, Mnire's disease, osteopenia, chronic lower back pain  Her back is bothering her - she has seen her back doctor.  She is doing PT.  She may need to have a MRI.    The past few weeks she has had increased gas - no change meds, food.  Would a probiotic help?     Medications and allergies reviewed with patient and updated if appropriate.  Current Outpatient Medications on File Prior to Visit  Medication Sig Dispense Refill   acetaminophen (TYLENOL) 500 MG tablet Take 500 mg by mouth every 6 (six) hours as needed for mild pain or moderate pain.     Apoaequorin (PREVAGEN PO) Take 1 tablet by mouth daily at 6 (six) AM.     CALCIUM PO Take by mouth.     cycloSPORINE (RESTASIS OP) Apply 1 drop to eye 2 (two) times daily.     diclofenac (VOLTAREN) 75 MG EC tablet Take 75 mg by mouth 2 (two) times daily as needed.     fluticasone (FLONASE) 50 MCG/ACT nasal spray Place 1 spray into both nostrils daily as needed for allergies or rhinitis.     levothyroxine (SYNTHROID) 50 MCG tablet Take 1 tablet (50 mcg total) by mouth daily. Annual appt due in Sept must see provider for future refills 90 tablet 0   Multiple Vitamins-Minerals (QC WOMENS DAILY MULTIVITAMIN) TABS Take 2 tablets by mouth daily.     Omega-3 Fatty Acids (FISH OIL TRIPLE STRENGTH) 1400 MG CAPS Take 1 tablet by mouth daily.      Polyethyl Glycol-Propyl Glycol 0.4-0.3 % SOLN Apply 1 drop to eye 2 (two) times daily. Regener eye drops     triamterene-hydrochlorothiazide (MAXZIDE-25) 37.5-25 MG per tablet Take 1 tablet by mouth daily.     UNABLE TO FIND instaflex joint support take 1 tablet a day     No current facility-administered medications on file prior to visit.     Review of Systems  Constitutional:  Negative  for chills, fatigue and fever.  Respiratory:  Negative for Richard, shortness of breath and wheezing.   Cardiovascular:  Negative for chest pain, palpitations and leg swelling.  Gastrointestinal:  Negative for abdominal pain, constipation and diarrhea.  Neurological:  Negative for light-headedness and headaches.       Objective:   Vitals:   03/27/22 1037  BP: 118/60  Pulse: 71  Temp: 97.6 F (36.4 C)  SpO2: 98%   BP Readings from Last 3 Encounters:  03/27/22 118/60  03/27/22 118/60  02/06/22 104/62   Wt Readings from Last 3 Encounters:  03/27/22 118 lb 6.4 oz (53.7 kg)  03/27/22 118 lb 6.4 oz (53.7 kg)  12/28/21 118 lb (53.5 kg)   Body mass index is 22.56 kg/m.    Physical Exam Constitutional:      General: She is not in acute distress.    Appearance: Normal appearance.  HENT:     Head: Normocephalic and atraumatic.  Eyes:     Conjunctiva/sclera: Conjunctivae normal.  Cardiovascular:     Rate and Rhythm: Normal rate and regular rhythm.     Heart sounds: Normal heart sounds. No murmur heard. Pulmonary:     Effort: Pulmonary effort is normal. No respiratory distress.     Breath  sounds: Normal breath sounds. No wheezing.  Musculoskeletal:     Cervical back: Neck supple.     Right lower leg: No edema.     Left lower leg: No edema.  Lymphadenopathy:     Cervical: No cervical adenopathy.  Skin:    General: Skin is warm and dry.     Findings: No rash.  Neurological:     Mental Status: She is alert. Mental status is at baseline.  Psychiatric:        Mood and Affect: Mood normal.        Behavior: Behavior normal.        Lab Results  Component Value Date   WBC 5.6 03/26/2021   HGB 14.2 03/26/2021   HCT 42.0 03/26/2021   PLT 212.0 03/26/2021   GLUCOSE 87 03/26/2021   CHOL 225 (H) 03/26/2021   TRIG 73.0 03/26/2021   HDL 67.80 03/26/2021   LDLDIRECT 126.8 11/04/2012   LDLCALC 143 (H) 03/26/2021   ALT 23 03/26/2021   AST 22 03/26/2021   NA 141 03/26/2021    K 3.7 03/26/2021   CL 102 03/26/2021   CREATININE 0.99 03/26/2021   BUN 26 (H) 03/26/2021   CO2 31 03/26/2021   TSH 1.66 03/26/2021     Assessment & Plan:    See Problem List for Assessment and Plan of chronic medical problems.

## 2022-03-27 ENCOUNTER — Ambulatory Visit (INDEPENDENT_AMBULATORY_CARE_PROVIDER_SITE_OTHER): Payer: Medicare Other | Admitting: Internal Medicine

## 2022-03-27 ENCOUNTER — Ambulatory Visit (INDEPENDENT_AMBULATORY_CARE_PROVIDER_SITE_OTHER): Payer: Medicare Other

## 2022-03-27 ENCOUNTER — Ambulatory Visit: Payer: Medicare Other | Admitting: Internal Medicine

## 2022-03-27 VITALS — BP 118/60 | HR 71 | Temp 97.6°F | Ht 60.75 in | Wt 118.4 lb

## 2022-03-27 DIAGNOSIS — M8589 Other specified disorders of bone density and structure, multiple sites: Secondary | ICD-10-CM

## 2022-03-27 DIAGNOSIS — E782 Mixed hyperlipidemia: Secondary | ICD-10-CM

## 2022-03-27 DIAGNOSIS — M48061 Spinal stenosis, lumbar region without neurogenic claudication: Secondary | ICD-10-CM | POA: Diagnosis not present

## 2022-03-27 DIAGNOSIS — E039 Hypothyroidism, unspecified: Secondary | ICD-10-CM

## 2022-03-27 DIAGNOSIS — H8109 Meniere's disease, unspecified ear: Secondary | ICD-10-CM

## 2022-03-27 DIAGNOSIS — Z0000 Encounter for general adult medical examination without abnormal findings: Secondary | ICD-10-CM | POA: Diagnosis not present

## 2022-03-27 LAB — COMPREHENSIVE METABOLIC PANEL
ALT: 20 U/L (ref 0–35)
AST: 19 U/L (ref 0–37)
Albumin: 4.1 g/dL (ref 3.5–5.2)
Alkaline Phosphatase: 55 U/L (ref 39–117)
BUN: 29 mg/dL — ABNORMAL HIGH (ref 6–23)
CO2: 29 mEq/L (ref 19–32)
Calcium: 10.1 mg/dL (ref 8.4–10.5)
Chloride: 100 mEq/L (ref 96–112)
Creatinine, Ser: 1.06 mg/dL (ref 0.40–1.20)
GFR: 50.16 mL/min — ABNORMAL LOW (ref >60.00)
Glucose, Bld: 87 mg/dL (ref 70–99)
Potassium: 3.8 mEq/L (ref 3.5–5.1)
Sodium: 138 mEq/L (ref 135–145)
Total Bilirubin: 0.7 mg/dL (ref 0.2–1.2)
Total Protein: 7.3 g/dL (ref 6.0–8.3)

## 2022-03-27 LAB — CBC WITH DIFFERENTIAL/PLATELET
Basophils Absolute: 0 10*3/uL (ref 0.0–0.1)
Basophils Relative: 0.5 % (ref 0.0–3.0)
Eosinophils Absolute: 0.1 10*3/uL (ref 0.0–0.7)
Eosinophils Relative: 0.8 % (ref 0.0–5.0)
HCT: 41.5 % (ref 36.0–46.0)
Hemoglobin: 13.7 g/dL (ref 12.0–15.0)
Lymphocytes Relative: 32.3 % (ref 12.0–46.0)
Lymphs Abs: 2.1 10*3/uL (ref 0.7–4.0)
MCHC: 33.1 g/dL (ref 30.0–36.0)
MCV: 94.5 fl (ref 78.0–100.0)
Monocytes Absolute: 0.4 10*3/uL (ref 0.1–1.0)
Monocytes Relative: 6.1 % (ref 3.0–12.0)
Neutro Abs: 4 10*3/uL (ref 1.4–7.7)
Neutrophils Relative %: 60.3 % (ref 43.0–77.0)
Platelets: 232 10*3/uL (ref 150.0–400.0)
RBC: 4.39 Mil/uL (ref 3.87–5.11)
RDW: 13.3 % (ref 11.5–15.5)
WBC: 6.6 10*3/uL (ref 4.0–10.5)

## 2022-03-27 LAB — TSH: TSH: 2.28 u[IU]/mL (ref 0.35–5.50)

## 2022-03-27 LAB — LIPID PANEL
Cholesterol: 203 mg/dL — ABNORMAL HIGH (ref 0–200)
HDL: 62.3 mg/dL (ref >39.00)
LDL Cholesterol: 127 mg/dL — ABNORMAL HIGH (ref 0–99)
NonHDL: 140.53
Total CHOL/HDL Ratio: 3
Triglycerides: 70 mg/dL (ref 0.0–149.0)
VLDL: 14 mg/dL (ref 0.0–40.0)

## 2022-03-27 MED ORDER — LEVOTHYROXINE SODIUM 50 MCG PO TABS: 50.0000 ug | ORAL_TABLET | Freq: Every day | ORAL | 3 refills | Status: DC

## 2022-03-27 NOTE — Patient Instructions (Signed)
Ms. Holly Richard , Thank you for taking time to come for your Medicare Wellness Visit. I appreciate your ongoing commitment to your health goals. Please review the following plan we discussed and let me know if I can assist you in the future.   Screening recommendations/referrals: Colonoscopy: 12/13/2020; due every 5 years Mammogram: 01/21/2022; due every year Bone Density: 01/22/2022; due every 3 years Recommended yearly ophthalmology/optometry visit for glaucoma screening and checkup Recommended yearly dental visit for hygiene and checkup  Vaccinations: Influenza vaccine: due Fall 2023 Pneumococcal vaccine: 09/05/2009, 02/27/2015 Tdap vaccine: 11/08/2013; due every 10 years Shingles vaccine: 07/17/2021, 08/08/2021 Covid-19:07/28/2019, 08/25/2019, 04/15/2020  Advanced directives: Yes; Please bring a copy of your health care power of attorney and living will to the office at your convenience.  Conditions/risks identified: Yes  Next appointment: Follow up in one year for your annual wellness visit.   Preventive Care 54 Years and Older, Female Preventive care refers to lifestyle choices and visits with your health care provider that can promote health and wellness. What does preventive care include? A yearly physical exam. This is also called an annual well check. Dental exams once or twice a year. Routine eye exams. Ask your health care provider how often you should have your eyes checked. Personal lifestyle choices, including: Daily care of your teeth and gums. Regular physical activity. Eating a healthy diet. Avoiding tobacco and drug use. Limiting alcohol use. Practicing safe sex. Taking low-dose aspirin every day. Taking vitamin and mineral supplements as recommended by your health care provider. What happens during an annual well check? The services and screenings done by your health care provider during your annual well check will depend on your age, overall health, lifestyle risk factors,  and family history of disease. Counseling  Your health care provider may ask you questions about your: Alcohol use. Tobacco use. Drug use. Emotional well-being. Home and relationship well-being. Sexual activity. Eating habits. History of falls. Memory and ability to understand (cognition). Work and work Statistician. Reproductive health. Screening  You may have the following tests or measurements: Height, weight, and BMI. Blood pressure. Lipid and cholesterol levels. These may be checked every 5 years, or more frequently if you are over 67 years old. Skin check. Lung cancer screening. You may have this screening every year starting at age 58 if you have a 30-pack-year history of smoking and currently smoke or have quit within the past 15 years. Fecal occult blood test (FOBT) of the stool. You may have this test every year starting at age 16. Flexible sigmoidoscopy or colonoscopy. You may have a sigmoidoscopy every 5 years or a colonoscopy every 10 years starting at age 89. Hepatitis C blood test. Hepatitis B blood test. Sexually transmitted disease (STD) testing. Diabetes screening. This is done by checking your blood sugar (glucose) after you have not eaten for a while (fasting). You may have this done every 1-3 years. Bone density scan. This is done to screen for osteoporosis. You may have this done starting at age 55. Mammogram. This may be done every 1-2 years. Talk to your health care provider about how often you should have regular mammograms. Talk with your health care provider about your test results, treatment options, and if necessary, the need for more tests. Vaccines  Your health care provider may recommend certain vaccines, such as: Influenza vaccine. This is recommended every year. Tetanus, diphtheria, and acellular pertussis (Tdap, Td) vaccine. You may need a Td booster every 10 years. Zoster vaccine. You may need this  after age 1. Pneumococcal 13-valent conjugate  (PCV13) vaccine. One dose is recommended after age 65. Pneumococcal polysaccharide (PPSV23) vaccine. One dose is recommended after age 38. Talk to your health care provider about which screenings and vaccines you need and how often you need them. This information is not intended to replace advice given to you by your health care provider. Make sure you discuss any questions you have with your health care provider. Document Released: 07/21/2015 Document Revised: 03/13/2016 Document Reviewed: 04/25/2015 Elsevier Interactive Patient Education  2017 [place] Prevention in the Home Falls can cause injuries. They can happen to people of all ages. There are many things you can do to make your home safe and to help prevent falls. What can I do on the outside of my home? Regularly fix the edges of walkways and driveways and fix any cracks. Remove anything that might make you trip as you walk through a door, such as a raised step or threshold. Trim any bushes or trees on the path to your home. Use bright outdoor lighting. Clear any walking paths of anything that might make someone trip, such as rocks or tools. Regularly check to see if handrails are loose or broken. Make sure that both sides of any steps have handrails. Any raised decks and porches should have guardrails on the edges. Have any leaves, snow, or ice cleared regularly. Use sand or salt on walking paths during winter. Clean up any spills in your garage right away. This includes oil or grease spills. What can I do in the bathroom? Use night lights. Install grab bars by the toilet and in the tub and shower. Do not use towel bars as grab bars. Use non-skid mats or decals in the tub or shower. If you need to sit down in the shower, use a plastic, non-slip stool. Keep the floor dry. Clean up any water that spills on the floor as soon as it happens. Remove soap buildup in the tub or shower regularly. Attach bath mats securely with  double-sided non-slip rug tape. Do not have throw rugs and other things on the floor that can make you trip. What can I do in the bedroom? Use night lights. Make sure that you have a light by your bed that is easy to reach. Do not use any sheets or blankets that are too big for your bed. They should not hang down onto the floor. Have a firm chair that has side arms. You can use this for support while you get dressed. Do not have throw rugs and other things on the floor that can make you trip. What can I do in the kitchen? Clean up any spills right away. Avoid walking on wet floors. Keep items that you use a lot in easy-to-reach places. If you need to reach something above you, use a strong step stool that has a grab bar. Keep electrical cords out of the way. Do not use floor polish or wax that makes floors slippery. If you must use wax, use non-skid floor wax. Do not have throw rugs and other things on the floor that can make you trip. What can I do with my stairs? Do not leave any items on the stairs. Make sure that there are handrails on both sides of the stairs and use them. Fix handrails that are broken or loose. Make sure that handrails are as long as the stairways. Check any carpeting to make sure that it is firmly attached to the  stairs. Fix any carpet that is loose or worn. Avoid having throw rugs at the top or bottom of the stairs. If you do have throw rugs, attach them to the floor with carpet tape. Make sure that you have a light switch at the top of the stairs and the bottom of the stairs. If you do not have them, ask someone to add them for you. What else can I do to help prevent falls? Wear shoes that: Do not have high heels. Have rubber bottoms. Are comfortable and fit you well. Are closed at the toe. Do not wear sandals. If you use a stepladder: Make sure that it is fully opened. Do not climb a closed stepladder. Make sure that both sides of the stepladder are locked  into place. Ask someone to hold it for you, if possible. Clearly mark and make sure that you can see: Any grab bars or handrails. First and last steps. Where the edge of each step is. Use tools that help you move around (mobility aids) if they are needed. These include: Canes. Walkers. Scooters. Crutches. Turn on the lights when you go into a dark area. Replace any light bulbs as soon as they burn out. Set up your furniture so you have a clear path. Avoid moving your furniture around. If any of your floors are uneven, fix them. If there are any pets around you, be aware of where they are. Review your medicines with your doctor. Some medicines can make you feel dizzy. This can increase your chance of falling. Ask your doctor what other things that you can do to help prevent falls. This information is not intended to replace advice given to you by your health care provider. Make sure you discuss any questions you have with your health care provider. Document Released: 04/20/2009 Document Revised: 11/30/2015 Document Reviewed: 07/29/2014 Elsevier Interactive Patient Education  2017 [employer].

## 2022-03-27 NOTE — Assessment & Plan Note (Signed)
Chronic DEXA up-to-date-done earlier this year Continue calcium and vitamin D supplementation Stressed regular exercise

## 2022-03-27 NOTE — Assessment & Plan Note (Signed)
Chronic Following with ENT On Maxide-37.5-25 daily CMP

## 2022-03-27 NOTE — Assessment & Plan Note (Signed)
Chronic Having increased back pain Following with orthopedics

## 2022-03-27 NOTE — Assessment & Plan Note (Signed)
Chronic Regular exercise and healthy diet encouraged Check lipid panel, CMP Continue lifestyle control 

## 2022-03-27 NOTE — Progress Notes (Signed)
Subjective:   Holly Richard is a 81 y.o. female who presents for Medicare Annual (Subsequent) preventive examination.  Review of Systems     Cardiac Risk Factors include: advanced age (>38mn, >>36women)     Objective:    Today's Vitals   03/27/22 0923  BP: 118/60  Pulse: 71  Temp: 97.6 F (36.4 C)  SpO2: 98%  Weight: 118 lb 6.4 oz (53.7 kg)  Height: 5' 0.75" (1.543 m)  PainSc: 1   PainLoc: Back   Body mass index is 22.56 kg/m.     03/27/2022    9:39 AM 03/24/2020    8:54 AM 03/24/2019    8:25 AM 03/19/2018   10:02 AM 03/18/2017    1:37 PM 01/02/2016    8:22 AM 12/19/2015    9:39 AM  Advanced Directives  Does Patient Have a Medical Advance Directive? Yes Yes No;_0   Type of AParamedicof AElizabethtownLiving will HNorth [place]Living will HMapletonLiving will HManvelLiving will HCharlestonLiving will  HOwens Cross RoadsLiving will  Does patient want to make changes to medical advance directive?  No - Patient declined       Copy of HSneads Ferryin Chart? No - copy requested No - copy requested No - copy requested No - copy requested No - copy requested No - copy requested     Current Medications (verified) Outpatient Encounter Medications as of 03/27/2022  Medication Sig   acetaminophen (TYLENOL) 500 MG tablet Take 500 mg by mouth every 6 (six) hours as needed for mild pain or moderate pain.   Apoaequorin (PREVAGEN PO) Take 1 tablet by mouth daily at 6 (six) AM.   CALCIUM PO Take by mouth.   cycloSPORINE (RESTASIS OP) Apply 1 drop to eye 2 (two) times daily.   diclofenac (VOLTAREN) 75 MG EC tablet Take 75 mg by mouth 2 (two) times daily as needed.   fluticasone (FLONASE) 50 MCG/ACT nasal spray Place 1 spray into both nostrils daily as needed for allergies or rhinitis.   levothyroxine (SYNTHROID) 50 MCG tablet Take 1 tablet (50 mcg  total) by mouth daily. Annual appt due in Sept must see provider for future refills   Multiple Vitamins-Minerals (QC WOMENS DAILY MULTIVITAMIN) TABS Take 2 tablets by mouth daily.   Omega-3 Fatty Acids (FISH OIL TRIPLE STRENGTH) 1400 MG CAPS Take 1 tablet by mouth daily.    Polyethyl Glycol-Propyl Glycol 0.4-0.3 % SOLN Apply 1 drop to eye 2 (two) times daily. Regener eye drops   triamterene-hydrochlorothiazide (MAXZIDE-25) 37.5-25 MG per tablet Take 1 tablet by mouth daily.   UNABLE TO FIND instaflex joint support take 1 tablet a day   No facility-administered encounter medications on file as of 03/27/2022.    Allergies (verified) Patient has no known allergies.   History: Past Medical History:  Diagnosis Date   ALLERGIC RHINITIS    ARTHRITIS    Arthritis    LEFT wrist/back   DIVERTICULOSIS, COLON    Hepatitis C antibody test positive 11/2014   negative RNA confirmation test   HSV-1 infection    HYPERLIPIDEMIA    diet controlled   HYPOTHYROIDISM    on meds   MENIERE'S DISEASE    MIGRAINE HEADACHE    OSTEOPENIA    Past Surgical History:  Procedure Laterality Date   (R) Ear implants  10/2008   Hearing loss-coclear implant    BUNIONECTOMY  X's - (R) 2/04 & (L) 3/04   COLONOSCOPY  2017   JP-MAC-suprep(exc)-TA x 1   LUMBAR LAMINECTOMY/DECOMPRESSION MICRODISCECTOMY Left 12/28/2014   Procedure: LUMBAR LAMINECTOMY/DECOMPRESSION MICRODISCECTOMY 2 LEVELS;  Surgeon: Jovita Gamma, MD;  Location: [facility];  Service: Neurosurgery;  Laterality: Left;  Left L34 laminotomy, foraminotomy and poss microdiskectomy with left L45 laminotomy, foraminotomy, resection of synovial cyst and poss microdiskectomy   POLYPECTOMY  2017   TA x 1   TONSILLECTOMY  1954   WISDOM TOOTH EXTRACTION     Family History  Problem Relation Age of Onset   Arthritis Mother    Diabetes Mother    Heart disease Mother    Stroke Mother    Colon cancer Father 65   Stroke Father    Breast cancer Sister     Diabetes Sister    Lung cancer Sister    Diabetes Brother    Prostate cancer Brother    Colon cancer Brother    Brain cancer Other        Nephew   Colon polyps Neg Hx    Rectal cancer Neg Hx    Stomach cancer Neg Hx    Social History   Socioeconomic History   Marital status: Married    Spouse name: Not on file   Number of children: 2   Years of education: Not on file   Highest education level: Not on file  Occupational History   Not on file  Tobacco Use   Smoking status: Never   Smokeless tobacco: Never  Vaping Use   Vaping Use: Never used  Substance and Sexual Activity   Alcohol use: No    Alcohol/week: 0.0 standard drinks of alcohol   Drug use: No   Sexual activity: Not Currently    Partners: Male    Birth control/protection: Post-menopausal    Comment: 1st intercourse-20, partners- 31, married- 68yr  Other Topics Concern   Not on file  Social History Narrative   Married, lives with spouse-2 sons and 4 g-kids nearby.    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   Social Determinants of [employer] Low Risk  (03/27/2022)   Overall Financial Resource Strain (CARDIA)    Difficulty of Paying Living Expenses: Not hard at all  Food Insecurity: No Food Insecurity (03/27/2022)   Hunger Vital Sign    Worried About Running Out of Food in the Last Year: Never true    RCharleroiin the Last Year: Never true  Transportation Needs: No Transportation Needs (03/27/2022)   PRAPARE - THydrologist(Medical): No    Lack of Transportation (Non-Medical): No  Physical Activity: Sufficiently Active (03/27/2022)   Exercise Vital Sign    Days of Exercise per Week: 5 days    Minutes of Exercise per Session: 30 min  Stress: No Stress Concern Present (03/27/2022)   FWest [place]azy   Feeling of Stress : Not at all  Social Connections: SFlatwoods(03/27/2022)    Social Connection and Isolation Panel [NHANES]    Frequency of Communication with Friends and Family: More than three times a week    Frequency of Social Gatherings with Friends and Family: More than three times a week    Attends Religious Services: More than 4 times per year    Active Member of [employer] Organizations: Yes    Attends CArchivistMeetings: More than  4 times per year    Marital Status: Married    Tobacco Counseling Counseling given: Not Answered   Clinical Intake:  Pre-visit preparation completed: Yes  Pain : No/denies pain Pain Score: 1      BMI - recorded: 22.56 Nutritional Status: BMI of 19-24  Normal Nutritional Risks: None Diabetes: No  How often do you need to have someone help you when you read instructions, pamphlets, or other written materials from your doctor or pharmacy?: 1 - Never What is the last grade level you completed in school?: HSG  Diabetic? no  Interpreter Needed?: No  Information entered by :: Lisette Abu, LPN.   Activities of Daily Living    03/27/2022    9:26 AM  In your present state of health, do you have any difficulty performing the following activities:  Hearing? 0  Vision? 0  Difficulty concentrating or making decisions? 0  Walking or climbing stairs? 0  Dressing or bathing? 0  Doing errands, shopping? 0  Preparing Food and eating ? N  Using the Toilet? N  In the past six months, have you accidently leaked urine? N  Do you have problems with loss of bowel control? N  Managing your Medications? N  Managing your Finances? N  Housekeeping or managing your Housekeeping? N    Patient Care Team: Binnie Rail, MD as PCP - General (Internal Medicine) Leta Baptist, MD (Otolaryngology) Jovita Gamma, MD (Neurosurgery) Suella Broad, MD (Physical Medicine and Rehabilitation) Princess Bruins, MD as Consulting Physician (Obstetrics and Gynecology) Camillo Flaming, OD as Referring Physician  (Optometry)  Indicate any recent Medical Services you may have received from other than [facility] providers in the past year (date may be approximate).     Assessment:   This is a routine wellness examination for [place].  Hearing/Vision screen Hearing Screening - Comments:: Patient wears hearing aids. Vision Screening - Comments:: Wears rx glasses - up to date with routine eye exams with Camillo Flaming, MD.   Dietary issues and exercise activities discussed: Current Exercise Habits: Home exercise routine, Type of exercise: walking, Time (Minutes): 30, Frequency (Times/Week): 5, Weekly Exercise (Minutes/Week): 150, Intensity: Moderate, Exercise limited by: orthopedic condition(s)   Goals Addressed             This Visit's Progress    Stay healthy.        Depression Screen    03/27/2022    9:25 AM 03/24/2020    8:13 AM 03/24/2019    8:26 AM 03/19/2018    9:33 AM 03/18/2017    1:37 PM 02/28/2016    8:22 AM 02/27/2015    8:21 AM  PHQ 2/9 Scores  PHQ - 2 Score 0 0 0 0 0 0 0  PHQ- 9 Score     2      Fall Risk    03/27/2022    9:26 AM 03/26/2021    7:55 AM 03/24/2020    8:13 AM 03/24/2019    8:26 AM 12/21/2018    9:55 AM  Fall Risk   Falls in the past year? 1 0 0 0 0  Number falls in past yr: 0 0 0 0 0  Injury with Fall? 0 0 0 0 0  Risk for fall due to : No Fall Risks No Fall Risks     Follow up Falls prevention discussed Falls evaluation completed       FALL RISK PREVENTION PERTAINING TO THE HOME:  Any stairs in or around the home? Yes  If so, are there any without handrails? No  Home free of loose throw rugs in walkways, pet beds, electrical cords, etc? Yes  Adequate lighting in your home to reduce risk of falls? Yes   ASSISTIVE DEVICES UTILIZED TO PREVENT FALLS:  Life alert? No  Use of a cane, walker or w/c? No  Grab bars in the bathroom? Yes  Shower chair or bench in shower? No  Elevated toilet seat or a handicapped toilet? No   TIMED UP AND GO:  Was the test  performed? Yes .  Length of time to ambulate 10 feet: 6 sec.   Gait steady and fast without use of assistive device  Cognitive Function:    03/19/2018   10:03 AM 03/18/2017    1:44 PM  MMSE - Mini Mental State Exam  Orientation to time 5 5  Orientation to Place 5 5  Registration 3 3  Attention/ Calculation 5 5  Recall 3 3  Language- name 2 objects 2 2  Language- repeat 1 1  Language- follow 3 step command 3 3  Language- read & follow direction 1 1  Write a sentence 1 1  Copy design 1 1  Total score 30 30        03/27/2022    9:26 AM 03/24/2020    8:58 AM  6CIT Screen  What Year? 0 points 0 points  What month? 0 points 0 points  What time? 0 points 0 points  Count back from 20 0 points 0 points  Months in reverse 0 points 0 points  Repeat phrase 0 points 0 points  Total Score 0 points 0 points    Immunizations Immunization History  Administered Date(s) Administered   Fluad Quad(high Dose 65+) 03/24/2019, 03/24/2020   Hep A / Hep B 02/28/2016, 06/11/2016   Hepatitis B, adult 04/01/2016   Influenza Split 04/23/2011, 04/07/2012, 03/29/2013   Influenza, High Dose Seasonal PF 03/18/2017, 03/19/2018   Influenza,inj,Quad PF,6+ Mos 02/28/2016   Moderna Sars-Covid-2 Vaccination 07/28/2019, 08/25/2019, 05/05/2020   Pneumococcal Conjugate-13 02/27/2015   Pneumococcal Polysaccharide-23 07/10/2004, 09/05/2009   Td 06/19/2003, 11/08/2013   Zoster Recombinat (Shingrix) 07/17/2021, 08/08/2021   Zoster, Live 07/08/2005    TDAP status: Up to date  Flu Vaccine status: Due, Education has been provided regarding the importance of this vaccine. Advised may receive this vaccine at local pharmacy or Health Dept. Aware to provide a copy of the vaccination record if obtained from local pharmacy or Health Dept. Verbalized acceptance and understanding.  Pneumococcal vaccine status: Up to date  Covid-19 vaccine status: Completed vaccines  Qualifies for Shingles Vaccine? Yes   Zostavax  completed Yes   Shingrix Completed?: Yes  Screening Tests Health Maintenance  Topic Date Due   COVID-19 Vaccine (4 - Moderna series) 06/30/2020   Zoster Vaccines- Shingrix (2 of 2) 10/03/2021   INFLUENZA VACCINE  02/05/2022   TETANUS/TDAP  11/09/2023   MAMMOGRAM  01/22/2024   DEXA SCAN  01/22/2025   COLONOSCOPY (Pts 45-4yr Insurance coverage will need to be confirmed)  12/13/2025   Pneumonia Vaccine 81 Years old  Completed   Hepatitis C Screening  Completed   HPV VACCINES  Aged Out    Health Maintenance  Health Maintenance Due  Topic Date Due   COVID-19 Vaccine (4 - Moderna series) 06/30/2020   Zoster Vaccines- Shingrix (2 of 2) 10/03/2021   INFLUENZA VACCINE  02/05/2022    Colorectal cancer screening: No longer required.   Mammogram status: Completed 01/21/2022. Repeat every year  Bone Density status: Completed 01/22/2022. Results reflect: Bone density results: OSTEOPENIA. Repeat every 2-3 years.  Lung Cancer Screening: (Low Dose CT Chest recommended if Age 2-80 years, 30 pack-year currently smoking OR have quit w/in 15years.) does not qualify.   Lung Cancer Screening Referral: no  Additional Screening:  Hepatitis C Screening: does qualify; Completed 03/26/2021  Vision Screening: Recommended annual ophthalmology exams for early detection of glaucoma and other disorders of the eye. Is the patient up to date with their annual eye exam?  Yes  Who is the provider or what is the name of the office in which the patient attends annual eye exams? Camillo Flaming, MD. If pt is not established with a provider, would they like to be referred to a provider to establish care? No .   Dental Screening: Recommended annual dental exams for proper oral hygiene  Community Resource Referral / Chronic Care Management: CRR required this visit?  No   CCM required this visit?  No      Plan:     I have personally reviewed and noted the following in the patient's chart:   Medical and  social history Use of alcohol, tobacco or illicit drugs  Current medications and supplements including opioid prescriptions. Patient is not currently taking opioid prescriptions. Functional ability and status Nutritional status Physical activity Advanced directives List of other physicians Hospitalizations, surgeries, and ER visits in previous 12 months Vitals Screenings to include cognitive, depression, and falls Referrals and appointments  In addition, I have reviewed and discussed with patient certain preventive protocols, quality metrics, and best practice recommendations. A written personalized care plan for preventive services as well as general preventive health recommendations were provided to patient.     Sheral Flow, LPN   0/03/2329   Nurse Notes: N/A

## 2022-03-27 NOTE — Assessment & Plan Note (Signed)
Chronic  Clinically euthyroid Check tsh and will titrate med dose if needed Currently taking levothyroxine 50 mcg daily 

## 2022-04-04 ENCOUNTER — Other Ambulatory Visit (HOSPITAL_COMMUNITY): Payer: Self-pay | Admitting: Neurological Surgery

## 2022-04-04 ENCOUNTER — Other Ambulatory Visit: Payer: Self-pay | Admitting: Neurological Surgery

## 2022-04-04 DIAGNOSIS — M5416 Radiculopathy, lumbar region: Secondary | ICD-10-CM

## 2022-04-08 DIAGNOSIS — Z23 Encounter for immunization: Secondary | ICD-10-CM | POA: Diagnosis not present

## 2022-04-24 ENCOUNTER — Ambulatory Visit (HOSPITAL_COMMUNITY)
Admission: RE | Admit: 2022-04-24 | Discharge: 2022-04-24 | Disposition: A | Discharge status: Home / Self Care | Payer: Medicare Other | Source: Ambulatory Visit | Attending: Neurological Surgery | Admitting: Neurological Surgery

## 2022-04-24 DIAGNOSIS — M5416 Radiculopathy, lumbar region: Secondary | ICD-10-CM | POA: Diagnosis not present

## 2022-04-24 DIAGNOSIS — M5126 Other intervertebral disc displacement, lumbar region: Secondary | ICD-10-CM | POA: Diagnosis not present

## 2022-04-24 DIAGNOSIS — M47816 Spondylosis without myelopathy or radiculopathy, lumbar region: Secondary | ICD-10-CM | POA: Diagnosis not present

## 2022-04-24 MED ORDER — GADOBUTROL 1 MMOL/ML IV SOLN
6.0000 mL | Freq: Once | INTRAVENOUS | Status: AC | PRN
  Administered 2022-04-24: 6 mL via INTRAVENOUS

## 2022-04-25 DIAGNOSIS — Z23 Encounter for immunization: Secondary | ICD-10-CM | POA: Diagnosis not present

## 2022-04-29 DIAGNOSIS — M4316 Spondylolisthesis, lumbar region: Secondary | ICD-10-CM | POA: Diagnosis not present

## 2022-05-06 DIAGNOSIS — M5116 Intervertebral disc disorders with radiculopathy, lumbar region: Secondary | ICD-10-CM | POA: Diagnosis not present

## 2022-05-06 DIAGNOSIS — M5416 Radiculopathy, lumbar region: Secondary | ICD-10-CM | POA: Diagnosis not present

## 2022-05-20 DIAGNOSIS — H43392 Other vitreous opacities, left eye: Secondary | ICD-10-CM | POA: Diagnosis not present

## 2022-05-20 DIAGNOSIS — H35342 Macular cyst, hole, or pseudohole, left eye: Secondary | ICD-10-CM | POA: Diagnosis not present

## 2022-05-20 DIAGNOSIS — H25013 Cortical age-related cataract, bilateral: Secondary | ICD-10-CM | POA: Diagnosis not present

## 2022-05-20 DIAGNOSIS — H2513 Age-related nuclear cataract, bilateral: Secondary | ICD-10-CM | POA: Diagnosis not present

## 2022-05-23 DIAGNOSIS — H903 Sensorineural hearing loss, bilateral: Secondary | ICD-10-CM | POA: Diagnosis not present

## 2022-05-23 DIAGNOSIS — H8101 Meniere's disease, right ear: Secondary | ICD-10-CM | POA: Diagnosis not present

## 2022-05-23 DIAGNOSIS — H838X3 Other specified diseases of inner ear, bilateral: Secondary | ICD-10-CM | POA: Diagnosis not present

## 2022-06-05 DIAGNOSIS — H35373 Puckering of macula, bilateral: Secondary | ICD-10-CM | POA: Diagnosis not present

## 2022-07-19 ENCOUNTER — Other Ambulatory Visit: Payer: Self-pay | Admitting: Obstetrics & Gynecology

## 2022-07-19 DIAGNOSIS — Z1231 Encounter for screening mammogram for malignant neoplasm of breast: Secondary | ICD-10-CM

## 2022-09-19 IMAGING — MG MM BREAST LOCALIZATION CLIP
4 series · 4 of 12 positions shown · non-contrast
Comparison: Previous exam(s).

CLINICAL DATA: Evaluate post biopsy marker placement following
stereotactic core needle biopsy of left breast calcifications.

EXAM:
3D DIAGNOSTIC LEFT MAMMOGRAM POST STEREOTACTIC BIOPSY

[L LM synth-2D]
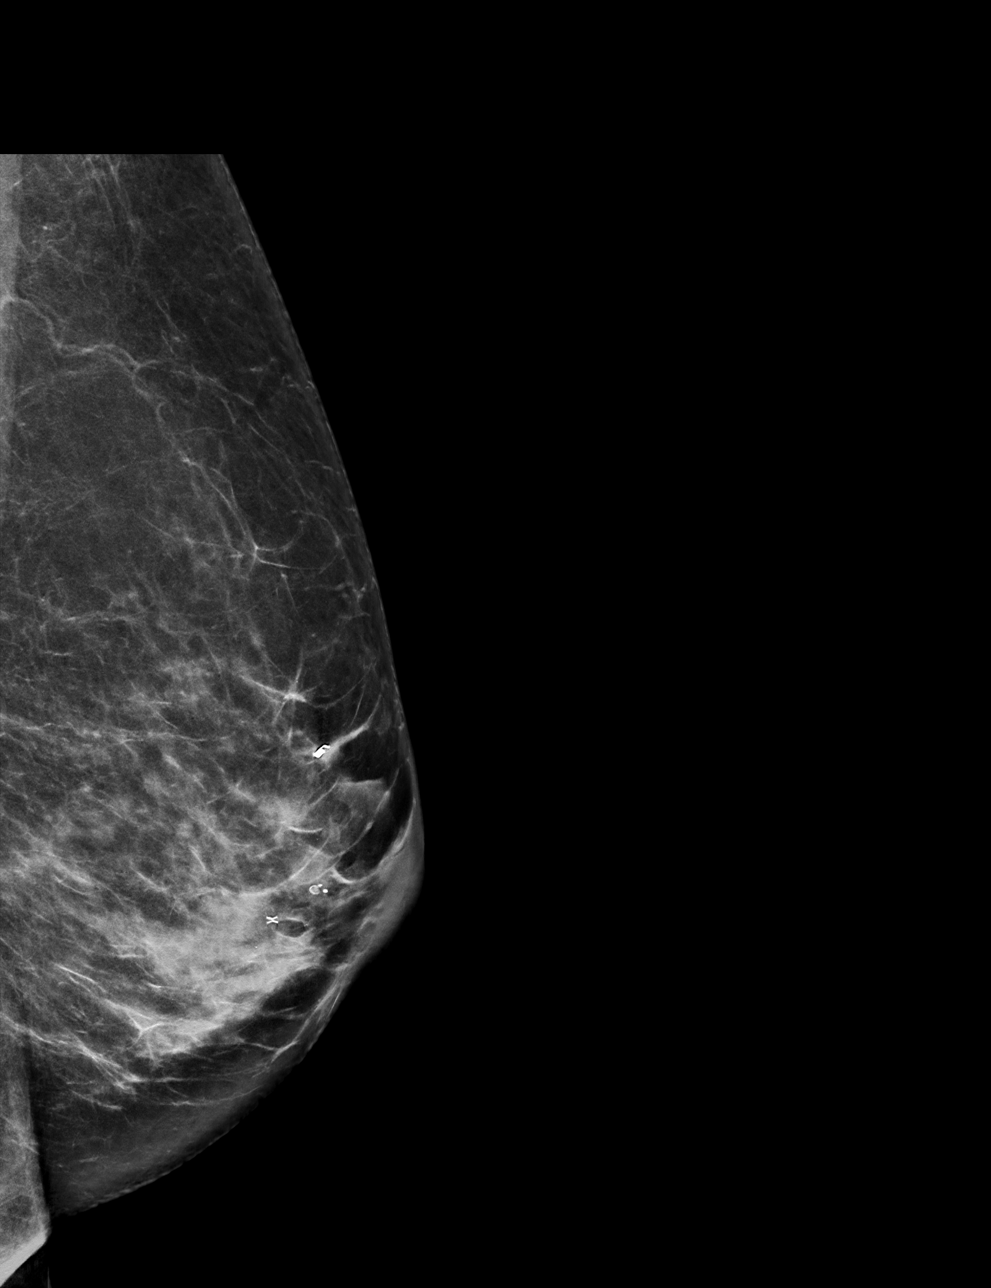

[L CC synth-2D]
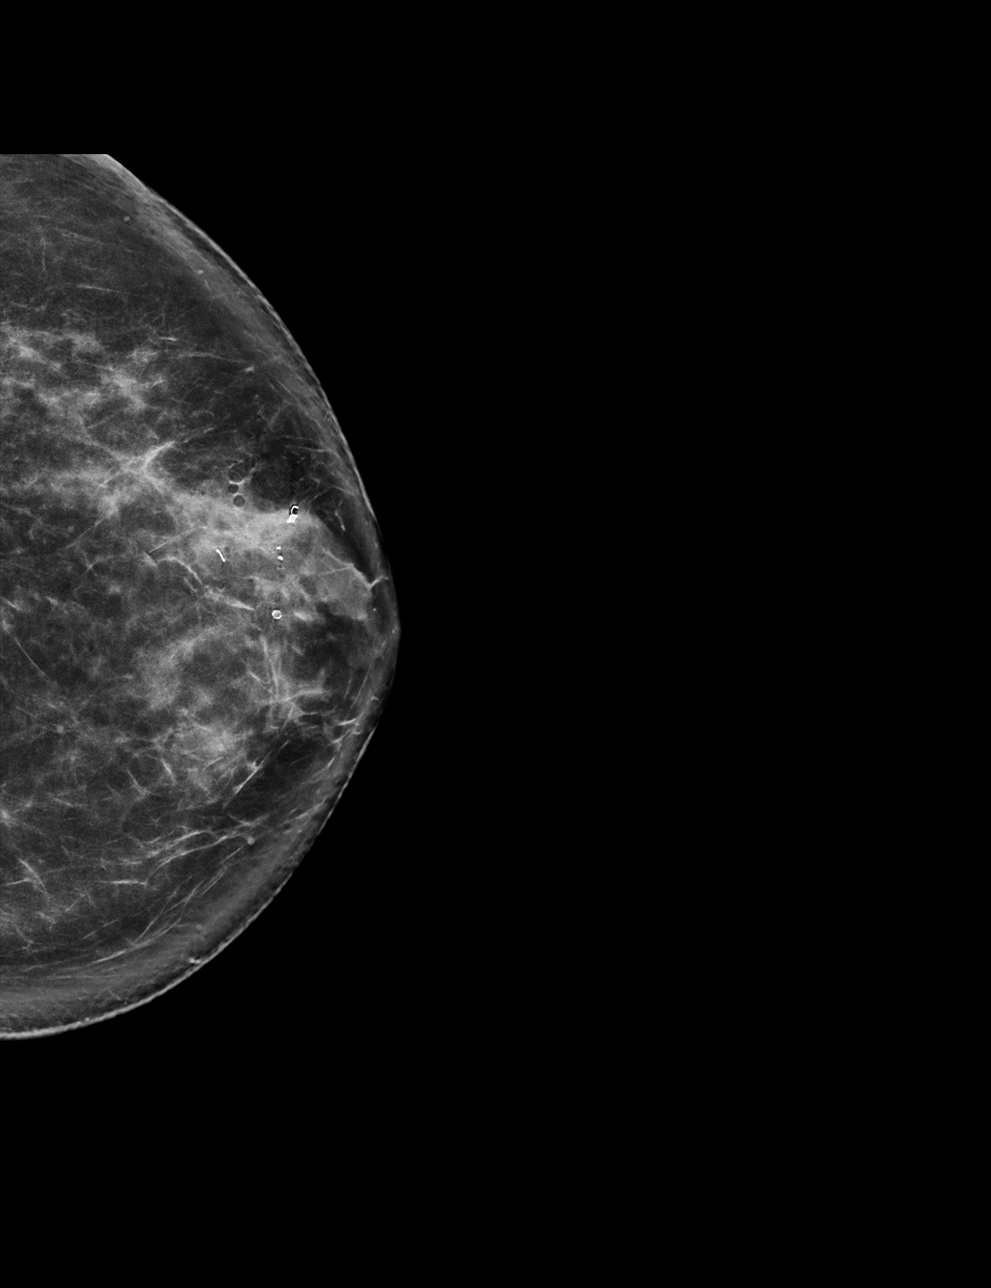

[L CC tomo · tomo slice 37/74.0]
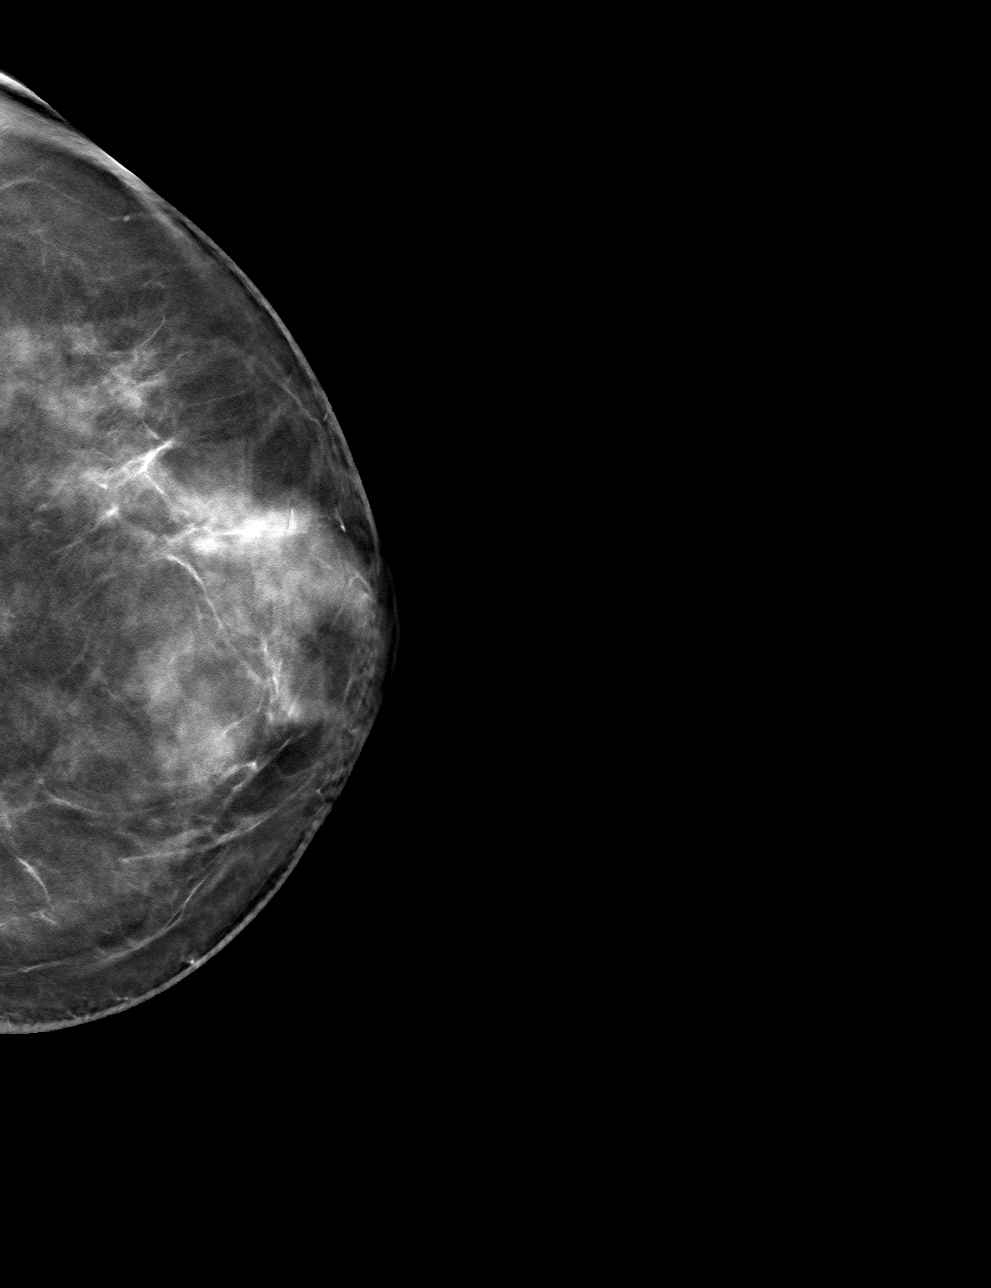

[L LM tomo · tomo slice 35/69.0]
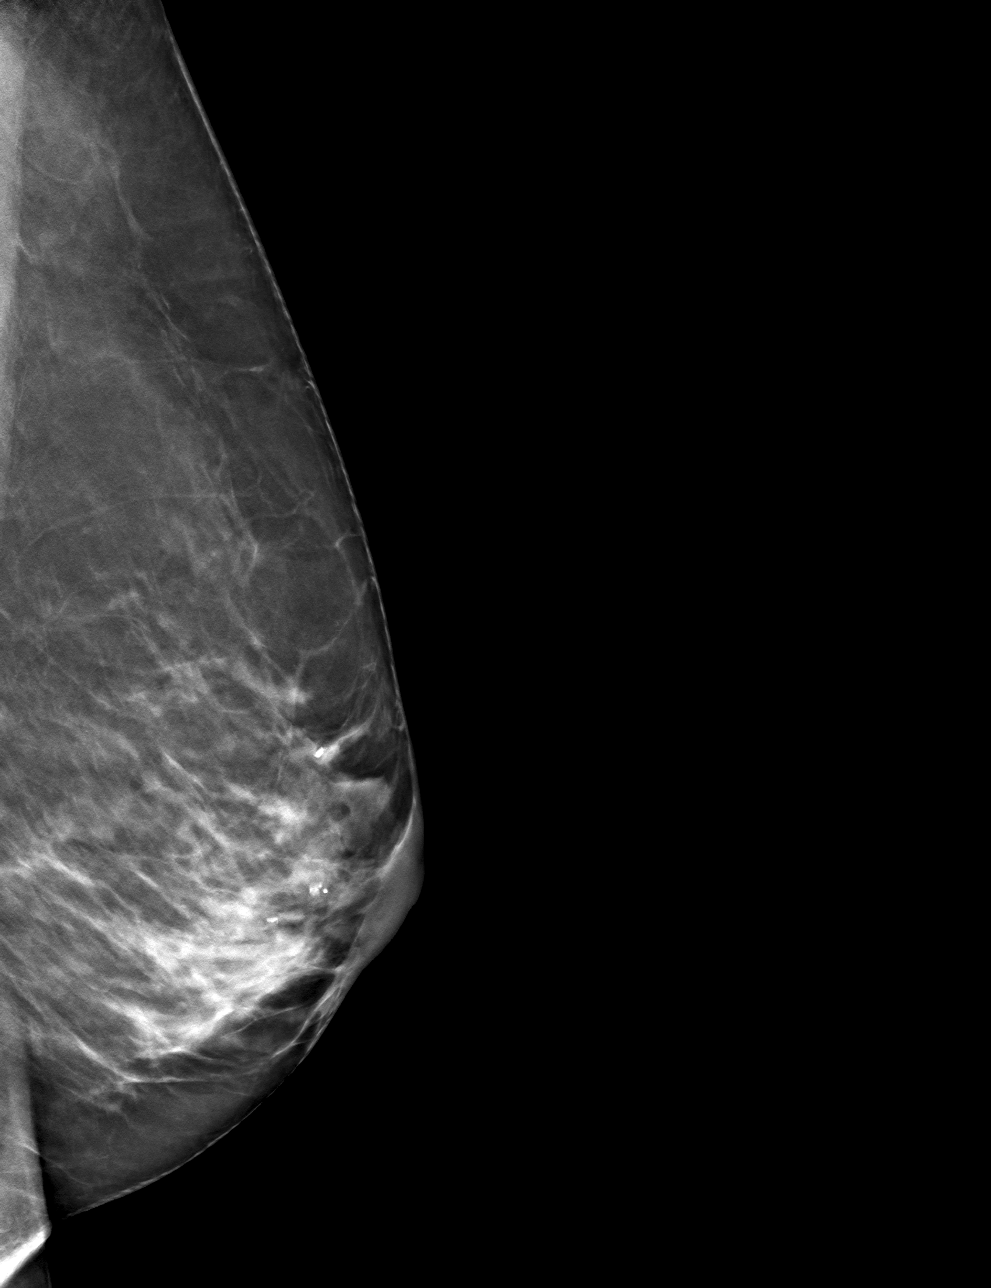

[4 of 12 positions shown; findings below may reference images not displayed]

FINDINGS: 3D Mammographic images were obtained following stereotactic guided
biopsy of left breast calcifications. The biopsy marking clip is in
expected position at the site of biopsy.
IMPRESSION: Appropriate positioning of the X shaped biopsy marking clip at the
site of biopsy in the lower outer left breast adjacent to a few
subtle residual calcifications.

Final Assessment: Post Procedure Mammograms for Marker Placement

## 2022-11-18 DIAGNOSIS — H8101 Meniere's disease, right ear: Secondary | ICD-10-CM | POA: Diagnosis not present

## 2022-11-18 DIAGNOSIS — H838X3 Other specified diseases of inner ear, bilateral: Secondary | ICD-10-CM | POA: Diagnosis not present

## 2022-11-18 DIAGNOSIS — H903 Sensorineural hearing loss, bilateral: Secondary | ICD-10-CM | POA: Diagnosis not present

## 2023-01-02 DIAGNOSIS — H43392 Other vitreous opacities, left eye: Secondary | ICD-10-CM | POA: Diagnosis not present

## 2023-01-02 DIAGNOSIS — H2513 Age-related nuclear cataract, bilateral: Secondary | ICD-10-CM | POA: Diagnosis not present

## 2023-01-02 DIAGNOSIS — H35373 Puckering of macula, bilateral: Secondary | ICD-10-CM | POA: Diagnosis not present

## 2023-01-22 DIAGNOSIS — M5440 Lumbago with sciatica, unspecified side: Secondary | ICD-10-CM | POA: Diagnosis not present

## 2023-01-22 DIAGNOSIS — M4180 Other forms of scoliosis, site unspecified: Secondary | ICD-10-CM | POA: Diagnosis not present

## 2023-01-23 ENCOUNTER — Ambulatory Visit: Payer: Medicare Other

## 2023-02-04 ENCOUNTER — Ambulatory Visit
Admission: RE | Admit: 2023-02-04 | Discharge: 2023-02-04 | Disposition: A | Discharge status: Home / Self Care | Payer: Medicare Other | Source: Ambulatory Visit | Attending: Obstetrics & Gynecology | Admitting: Obstetrics & Gynecology

## 2023-02-04 DIAGNOSIS — Z1231 Encounter for screening mammogram for malignant neoplasm of breast: Secondary | ICD-10-CM | POA: Diagnosis not present

## 2023-02-20 ENCOUNTER — Encounter (INDEPENDENT_AMBULATORY_CARE_PROVIDER_SITE_OTHER): Payer: Self-pay

## 2023-03-25 DIAGNOSIS — H18413 Arcus senilis, bilateral: Secondary | ICD-10-CM | POA: Diagnosis not present

## 2023-03-25 DIAGNOSIS — H25043 Posterior subcapsular polar age-related cataract, bilateral: Secondary | ICD-10-CM | POA: Diagnosis not present

## 2023-03-25 DIAGNOSIS — H2512 Age-related nuclear cataract, left eye: Secondary | ICD-10-CM | POA: Diagnosis not present

## 2023-03-25 DIAGNOSIS — H2513 Age-related nuclear cataract, bilateral: Secondary | ICD-10-CM | POA: Diagnosis not present

## 2023-03-25 DIAGNOSIS — H35373 Puckering of macula, bilateral: Secondary | ICD-10-CM | POA: Diagnosis not present

## 2023-03-30 ENCOUNTER — Encounter: Payer: Self-pay | Admitting: Internal Medicine

## 2023-03-30 NOTE — Patient Instructions (Addendum)
     Decrease the amount of diclofenac you take.  You can take up to 3000 mg of tylenol in one day.     Try to increase your fluid intake.    Blood work was ordered.   The lab is on the first floor.    Medications changes include :   none     Return in about 1 year (around 03/30/2024) for follow up.

## 2023-03-30 NOTE — Progress Notes (Unsigned)
Subjective:    Patient ID: Holly Richard, female    DOB: 1941-01-15, 82 y.o.   MRN: [id number]     HPI Rashawnda is here for follow up of her chronic medical problems.  Taking diclofenac 0-2 times a day.  Increased back pain recently - take it more than usual.  Just saw the back doctor.    Ckd is new  To have cataracts done.    Medications and allergies reviewed with patient and updated if appropriate.  Current Outpatient Medications on File Prior to Visit  Medication Sig Dispense Refill   acetaminophen (TYLENOL) 500 MG tablet Take 500 mg by mouth every 6 (six) hours as needed for mild pain or moderate pain.     Apoaequorin (PREVAGEN PO) Take 1 tablet by mouth daily at 6 (six) AM.     CALCIUM PO Take by mouth.     cycloSPORINE (RESTASIS OP) Apply 1 drop to eye 2 (two) times daily.     diclofenac (VOLTAREN) 75 MG EC tablet Take 75 mg by mouth 2 (two) times daily as needed.     fluticasone (FLONASE) 50 MCG/ACT nasal spray Place 1 spray into both nostrils daily as needed for allergies or rhinitis.     levothyroxine (SYNTHROID) 50 MCG tablet Take 1 tablet (50 mcg total) by mouth daily. Annual appt due in Sept must see provider for future refills 90 tablet 3   Multiple Vitamins-Minerals (QC WOMENS DAILY MULTIVITAMIN) TABS Take 2 tablets by mouth daily.     Omega-3 Fatty Acids (FISH OIL TRIPLE STRENGTH) 1400 MG CAPS Take 1 tablet by mouth daily.      Polyethyl Glycol-Propyl Glycol 0.4-0.3 % SOLN Apply 1 drop to eye 2 (two) times daily. Regener eye drops     triamterene-hydrochlorothiazide (MAXZIDE-25) 37.5-25 MG per tablet Take 1 tablet by mouth daily.     UNABLE TO FIND instaflex joint support take 1 tablet a day     No current facility-administered medications on file prior to visit.     Review of Systems  Constitutional:  Negative for fever.  Respiratory:  Negative for cough, shortness of breath and wheezing.   Cardiovascular:  Negative for chest pain, palpitations  and leg swelling.  Musculoskeletal:  Positive for back pain. Negative for arthralgias.  Neurological:  Negative for light-headedness and headaches.       Objective:   Vitals:   03/31/23 0909  BP: 106/74  Pulse: 85  Temp: 97.6 F (36.4 C)  SpO2: 97%   BP Readings from Last 3 Encounters:  03/31/23 106/74  03/31/23 106/74  03/27/22 118/60   Wt Readings from Last 3 Encounters:  03/31/23 116 lb (52.6 kg)  03/31/23 116 lb (52.6 kg)  03/27/22 118 lb 6.4 oz (53.7 kg)   Body mass index is 22.1 kg/m.    Physical Exam Constitutional:      General: She is not in acute distress.    Appearance: Normal appearance.  HENT:     Head: Normocephalic and atraumatic.  Eyes:     Conjunctiva/sclera: Conjunctivae normal.  Cardiovascular:     Rate and Rhythm: Normal rate and regular rhythm.     Heart sounds: Normal heart sounds.  Pulmonary:     Effort: Pulmonary effort is normal. No respiratory distress.     Breath sounds: Normal breath sounds. No wheezing.  Musculoskeletal:     Cervical back: Neck supple.     Right lower leg: No edema.     Left lower  leg: No edema.  Lymphadenopathy:     Cervical: No cervical adenopathy.  Skin:    General: Skin is warm and dry.     Findings: No rash.  Neurological:     Mental Status: She is alert. Mental status is at baseline.  Psychiatric:        Mood and Affect: Mood normal.        Behavior: Behavior normal.        Lab Results  Component Value Date   WBC 6.6 03/27/2022   HGB 13.7 03/27/2022   HCT 41.5 03/27/2022   PLT 232.0 03/27/2022   GLUCOSE 87 03/27/2022   CHOL 203 (H) 03/27/2022   TRIG 70.0 03/27/2022   HDL 62.30 03/27/2022   LDLDIRECT 126.8 11/04/2012   LDLCALC 127 (H) 03/27/2022   ALT 20 03/27/2022   AST 19 03/27/2022   NA 138 03/27/2022   K 3.8 03/27/2022   CL 100 03/27/2022   CREATININE 1.06 03/27/2022   BUN 29 (H) 03/27/2022   CO2 29 03/27/2022   TSH 2.28 03/27/2022     Assessment & Plan:    See Problem List  for Assessment and Plan of chronic medical problems.

## 2023-03-31 ENCOUNTER — Ambulatory Visit: Payer: Medicare Other | Admitting: Internal Medicine

## 2023-03-31 ENCOUNTER — Ambulatory Visit (INDEPENDENT_AMBULATORY_CARE_PROVIDER_SITE_OTHER): Payer: Medicare Other

## 2023-03-31 VITALS — BP 106/74 | HR 85 | Temp 97.6°F | Ht 60.75 in | Wt 116.0 lb

## 2023-03-31 DIAGNOSIS — Z0000 Encounter for general adult medical examination without abnormal findings: Secondary | ICD-10-CM | POA: Diagnosis not present

## 2023-03-31 DIAGNOSIS — M545 Low back pain, unspecified: Secondary | ICD-10-CM | POA: Diagnosis not present

## 2023-03-31 DIAGNOSIS — N1831 Chronic kidney disease, stage 3a: Secondary | ICD-10-CM

## 2023-03-31 DIAGNOSIS — E039 Hypothyroidism, unspecified: Secondary | ICD-10-CM

## 2023-03-31 DIAGNOSIS — H8101 Meniere's disease, right ear: Secondary | ICD-10-CM | POA: Diagnosis not present

## 2023-03-31 DIAGNOSIS — E782 Mixed hyperlipidemia: Secondary | ICD-10-CM | POA: Diagnosis not present

## 2023-03-31 DIAGNOSIS — R2991 Unspecified symptoms and signs involving the musculoskeletal system: Secondary | ICD-10-CM | POA: Insufficient documentation

## 2023-03-31 DIAGNOSIS — M8589 Other specified disorders of bone density and structure, multiple sites: Secondary | ICD-10-CM

## 2023-03-31 DIAGNOSIS — G8929 Other chronic pain: Secondary | ICD-10-CM | POA: Diagnosis not present

## 2023-03-31 LAB — COMPREHENSIVE METABOLIC PANEL
ALT: 18 U/L (ref 0–35)
AST: 15 U/L (ref 0–37)
Albumin: 4.7 g/dL (ref 3.5–5.2)
Alkaline Phosphatase: 46 U/L (ref 39–117)
BUN: 21 mg/dL (ref 6–23)
CO2: 28 mEq/L (ref 19–32)
Calcium: 10.4 mg/dL (ref 8.4–10.5)
Chloride: 102 mEq/L (ref 96–112)
Creatinine, Ser: 0.99 mg/dL (ref 0.40–1.20)
GFR: 54.06 mL/min — ABNORMAL LOW (ref >60.00)
Glucose, Bld: 110 mg/dL — ABNORMAL HIGH (ref 70–99)
Potassium: 4 mEq/L (ref 3.5–5.1)
Sodium: 141 mEq/L (ref 135–145)
Total Bilirubin: 0.6 mg/dL (ref 0.2–1.2)
Total Protein: 7.7 g/dL (ref 6.0–8.3)

## 2023-03-31 LAB — VITAMIN D 25 HYDROXY (VIT D DEFICIENCY, FRACTURES): VITD: 54.07 ng/mL (ref 30.00–100.00)

## 2023-03-31 LAB — LIPID PANEL
Cholesterol: 253 mg/dL — ABNORMAL HIGH (ref 0–200)
HDL: 80.4 mg/dL (ref >39.00)
LDL Cholesterol: 159 mg/dL — ABNORMAL HIGH (ref 0–99)
NonHDL: 173.06
Total CHOL/HDL Ratio: 3
Triglycerides: 70 mg/dL (ref 0.0–149.0)
VLDL: 14 mg/dL (ref 0.0–40.0)

## 2023-03-31 LAB — CBC WITH DIFFERENTIAL/PLATELET
Basophils Absolute: 0 10*3/uL (ref 0.0–0.1)
Basophils Relative: 0.2 % (ref 0.0–3.0)
Eosinophils Absolute: 0 10*3/uL (ref 0.0–0.7)
Eosinophils Relative: 0 % (ref 0.0–5.0)
HCT: 42.3 % (ref 36.0–46.0)
Hemoglobin: 14.3 g/dL (ref 12.0–15.0)
Lymphocytes Relative: 28.4 % (ref 12.0–46.0)
Lymphs Abs: 1.7 10*3/uL (ref 0.7–4.0)
MCHC: 33.7 g/dL (ref 30.0–36.0)
MCV: 93.7 fl (ref 78.0–100.0)
Monocytes Absolute: 0.4 10*3/uL (ref 0.1–1.0)
Monocytes Relative: 5.9 % (ref 3.0–12.0)
Neutro Abs: 4 10*3/uL (ref 1.4–7.7)
Neutrophils Relative %: 65.5 % (ref 43.0–77.0)
Platelets: 275 10*3/uL (ref 150.0–400.0)
RBC: 4.51 Mil/uL (ref 3.87–5.11)
RDW: 13.4 % (ref 11.5–15.5)
WBC: 6.1 10*3/uL (ref 4.0–10.5)

## 2023-03-31 LAB — TSH: TSH: 0.89 u[IU]/mL (ref 0.35–5.50)

## 2023-03-31 NOTE — Assessment & Plan Note (Signed)
Chronic DEXA up-to-date-monitored by GYN Continue calcium and vitamin D supplementation Stressed regular exercise

## 2023-03-31 NOTE — Progress Notes (Signed)
Subjective:   Holly Richard is a 82 y.o. female who presents for Medicare Annual (Subsequent) preventive examination.  Visit Complete: In person  Patient Medicare AWV questionnaire was completed by the patient on N/A; I have confirmed that all information answered by patient is correct and no changes since this date.  Cardiac Risk Factors include: advanced age (>28men, >14 women);dyslipidemia     Objective:    Today's Vitals   03/31/23 0759 03/31/23 0838  BP: 106/74   Pulse: 85   Temp: 97.6 F (36.4 C)   TempSrc: Temporal   SpO2: 97%   Weight: 116 lb (52.6 kg)   Height: 5' 0.75" (1.543 m)   PainSc:  2    Body mass index is 22.1 kg/m.     03/31/2023    8:43 AM 03/27/2022    9:39 AM 03/24/2020    8:54 AM 03/24/2019    8:25 AM 03/19/2018   10:02 AM 03/18/2017    1:37 PM 01/02/2016    8:22 AM  Advanced Directives  Does Patient Have a Medical Advance Directive? Yes Yes Yes No;Yes Yes Yes Yes  Type of Estate agent of [place];Living will Healthcare Power of [place];Living will Healthcare Power of [place];Living will Healthcare Power of [place];Living will Healthcare Power of [place];Living will Healthcare Power of [place];Living will   Does patient want to make changes to medical advance directive? No - Patient declined  No - Patient declined      Copy of Healthcare Power of Attorney in Chart?  No - copy requested No - copy requested No - copy requested No - copy requested No - copy requested No - copy requested    Current Medications (verified) Outpatient Encounter Medications as of 03/31/2023  Medication Sig   acetaminophen (TYLENOL) 500 MG tablet Take 500 mg by mouth every 6 (six) hours as needed for mild pain or moderate pain.   Apoaequorin (PREVAGEN PO) Take 1 tablet by mouth daily at 6 (six) AM.   CALCIUM PO Take by mouth.   cycloSPORINE (RESTASIS OP) Apply 1 drop to eye 2 (two) times daily.   diclofenac (VOLTAREN) 75 MG EC tablet Take 75  mg by mouth 2 (two) times daily as needed.   fluticasone (FLONASE) 50 MCG/ACT nasal spray Place 1 spray into both nostrils daily as needed for allergies or rhinitis.   levothyroxine (SYNTHROID) 50 MCG tablet Take 1 tablet (50 mcg total) by mouth daily. Annual appt due in Sept must see provider for future refills   Multiple Vitamins-Minerals (QC WOMENS DAILY MULTIVITAMIN) TABS Take 2 tablets by mouth daily.   Omega-3 Fatty Acids (FISH OIL TRIPLE STRENGTH) 1400 MG CAPS Take 1 tablet by mouth daily.    Polyethyl Glycol-Propyl Glycol 0.4-0.3 % SOLN Apply 1 drop to eye 2 (two) times daily. Regener eye drops   triamterene-hydrochlorothiazide (MAXZIDE-25) 37.5-25 MG per tablet Take 1 tablet by mouth daily.   UNABLE TO FIND instaflex joint support take 1 tablet a day (Patient not taking: Reported on 03/31/2023)   No facility-administered encounter medications on file as of 03/31/2023.    Allergies (verified) Patient has no known allergies.   History: Past Medical History:  Diagnosis Date   ALLERGIC RHINITIS    ARTHRITIS    Arthritis    LEFT wrist/back   DIVERTICULOSIS, COLON    Hepatitis C antibody test positive 11/2014   negative RNA confirmation test   HSV-1 infection    HYPERLIPIDEMIA    diet controlled   HYPOTHYROIDISM  on meds   MENIERE'S DISEASE    MIGRAINE HEADACHE    OSTEOPENIA    Past Surgical History:  Procedure Laterality Date   (R) Ear implants  10/2008   Hearing loss-coclear implant    BUNIONECTOMY     X's - (R) 2/04 & (L) 3/04   COLONOSCOPY  2017   JP-MAC-suprep(exc)-TA x 1   LUMBAR LAMINECTOMY/DECOMPRESSION MICRODISCECTOMY Left 12/28/2014   Procedure: LUMBAR LAMINECTOMY/DECOMPRESSION MICRODISCECTOMY 2 LEVELS;  Surgeon: Shirlean Kelly, MD;  Location: [facility];  Service: Neurosurgery;  Laterality: Left;  Left L34 laminotomy, foraminotomy and poss microdiskectomy with left L45 laminotomy, foraminotomy, resection of synovial cyst and poss microdiskectomy    POLYPECTOMY  2017   TA x 1   TONSILLECTOMY  1954   WISDOM TOOTH EXTRACTION     Family History  Problem Relation Age of Onset   Arthritis Mother    Diabetes Mother    Heart disease Mother    Stroke Mother    Colon cancer Father 38   Stroke Father    Breast cancer Sister    Diabetes Sister    Lung cancer Sister    Diabetes Brother    Prostate cancer Brother    Colon cancer Brother    Brain cancer Other        Nephew   Colon polyps Neg Hx    Rectal cancer Neg Hx    Stomach cancer Neg Hx    Social History   Socioeconomic History   Marital status: Married    Spouse name: Not on file   Number of children: 2   Years of education: Not on file   Highest education level: Not on file  Occupational History   Not on file  Tobacco Use   Smoking status: Never   Smokeless tobacco: Never  Vaping Use   Vaping status: Never Used  Substance and Sexual Activity   Alcohol use: No    Alcohol/week: 0.0 standard drinks of alcohol   Drug use: No   Sexual activity: Not Currently    Partners: Male    Birth control/protection: Post-menopausal    Comment: 1st intercourse-20, partners- 1, married- 7yrs  Other Topics Concern   Not on file  Social History Narrative   Married, lives with spouse-2 sons and 4 g-kids nearby.    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   Social Determinants of [employer]: Low Risk  (03/31/2023)   Overall Financial Resource Strain (CARDIA)    Difficulty of Paying Living Expenses: Not hard at all  Food Insecurity: No Food Insecurity (03/31/2023)   Hunger Vital Sign    Worried About Running Out of Food in the Last Year: Never true    Ran Out of Food in the Last Year: Never true  Transportation Needs: No Transportation Needs (03/31/2023)   PRAPARE - Administrator, Civil Service (Medical): No    Lack of Transportation (Non-Medical): No  Physical Activity: Sufficiently Active (03/31/2023)   Exercise Vital Sign    Days  of Exercise per Week: 5 days    Minutes of Exercise per Session: 60 min  Stress: No Stress Concern Present (03/31/2023)   [employer] of Occupational Health - Occupational Stress Questionnaire    Feeling of Stress : Not at all  Social Connections: Socially Integrated (03/31/2023)   Social Connection and Isolation Panel [NHANES]    Frequency of Communication with Friends and Family: More than three times a week    Frequency  of Social Gatherings with Friends and Family: More than three times a week    Attends Religious Services: More than 4 times per year    Active Member of [employer] or Organizations: Yes    Attends Banker Meetings: 1 to 4 times per year    Marital Status: Married    Tobacco Counseling Counseling given: Not Answered   Clinical Intake:  Pre-visit preparation completed: Yes  Pain : 0-10 Pain Score: 2  Pain Type: Chronic pain Pain Location: Back Pain Orientation: Lower Pain Descriptors / Indicators: Aching, Dull Pain Onset: Other (comment) (years) Pain Frequency: Intermittent     BMI - recorded: 22 Nutritional Status: BMI of 19-24  Normal Nutritional Risks: None Diabetes: No  How often do you need to have someone help you when you read instructions, pamphlets, or other written materials from your doctor or pharmacy?: 1 - Never What is the last grade level you completed in school?: 12th grade  Interpreter Needed?: No  Information entered by :: Elyse Jarvis, CMA   Activities of Daily Living    03/31/2023    8:44 AM  In your present state of health, do you have any difficulty performing the following activities:  Hearing? 1  Comment Right ear- no hearing, Left ear- limited hearing  Vision? 0  Difficulty concentrating or making decisions? 0  Walking or climbing stairs? 0  Dressing or bathing? 0  Doing errands, shopping? 0  Preparing Food and eating ? N  Using the Toilet? N  In the past six months, have you accidently leaked urine? Y   Do you have problems with loss of bowel control? N  Managing your Medications? N  Managing your Finances? N  Housekeeping or managing your Housekeeping? N    Patient Care Team: Pincus Sanes, MD as PCP - General (Internal Medicine) Newman Pies, MD (Otolaryngology) Shirlean Kelly, MD (Neurosurgery) Sheran Luz, MD (Physical Medicine and Rehabilitation) Genia Del, MD as Consulting Physician (Obstetrics and Gynecology) Gelene Mink, OD as Referring Physician (Optometry)  Indicate any recent Medical Services you may have received from other than [facility] providers in the past year (date may be approximate).     Assessment:   This is a routine wellness examination for [place].  Hearing/Vision screen Patient has hearing difficulty and wears hearing aids. Right ear has no hearing and Left ear has limited hearing. Patient wears glasses but is soon going for cataract surgery.    Goals Addressed               This Visit's Progress     Patient Stated (pt-stated)        I would like to continue to stay as healthy as I can.         Depression Screen    03/31/2023    8:33 AM 03/27/2022    9:25 AM 03/24/2020    8:13 AM 03/24/2019    8:26 AM 03/19/2018    9:33 AM 03/18/2017    1:37 PM 02/28/2016    8:22 AM  PHQ 2/9 Scores  PHQ - 2 Score 0 0 0 0 0 0 0  PHQ- 9 Score      2     Fall Risk    03/31/2023    8:32 AM 03/27/2022   10:38 AM 03/27/2022    9:26 AM 03/26/2021    7:55 AM 03/24/2020    8:13 AM  Fall Risk   Falls in the past year? 0 0 1 0 0  Number falls in past yr: 0 0 0 0 0  Injury with Fall? 0 0 0 0 0  Risk for fall due to : No Fall Risks No Fall Risks No Fall Risks No Fall Risks   Follow up Falls evaluation completed Falls evaluation completed Falls prevention discussed Falls evaluation completed     MEDICARE RISK AT HOME: Medicare Risk at Home Any stairs in or around the home?: Yes If so, are there any without handrails?: No Home free of loose throw rugs  in walkways, pet beds, electrical cords, etc?: Yes Adequate lighting in your home to reduce risk of falls?: Yes Life alert?: No Use of a cane, walker or w/c?: No (does have access to cane if needs it) Grab bars in the bathroom?: Yes Shower chair or bench in shower?: Yes Elevated toilet seat or a handicapped toilet?: No  TIMED UP AND GO:  Was the test performed?  No    Cognitive Function: Patient is cogitatively intact.    03/19/2018   10:03 AM 03/18/2017    1:44 PM  MMSE - Mini Mental State Exam  Orientation to time 5 5  Orientation to Place 5 5  Registration 3 3  Attention/ Calculation 5 5  Recall 3 3  Language- name 2 objects 2 2  Language- repeat 1 1  Language- follow 3 step command 3 3  Language- read & follow direction 1 1  Write a sentence 1 1  Copy design 1 1  Total score 30 30        03/31/2023    8:44 AM 03/27/2022    9:26 AM 03/24/2020    8:58 AM  6CIT Screen  What Year? 0 points 0 points 0 points  What month? 0 points 0 points 0 points  What time? 0 points 0 points 0 points  Count back from 20 0 points 0 points 0 points  Months in reverse 0 points 0 points 0 points  Repeat phrase 0 points 0 points 0 points  Total Score 0 points 0 points 0 points    Immunizations Immunization History  Administered Date(s) Administered   Fluad Quad(high Dose 65+) 03/24/2019, 03/24/2020   Hep A / Hep B 02/28/2016, 06/11/2016   Hepatitis B, ADULT 04/01/2016   Influenza Split 04/23/2011, 04/07/2012, 03/29/2013   Influenza, High Dose Seasonal PF 03/18/2017, 03/19/2018   Influenza,inj,Quad PF,6+ Mos 02/28/2016   Moderna Sars-Covid-2 Vaccination 07/28/2019, 08/25/2019, 05/05/2020   Pneumococcal Conjugate-13 02/27/2015   Pneumococcal Polysaccharide-23 07/10/2004, 09/05/2009   Td 06/19/2003, 11/08/2013   Zoster Recombinant(Shingrix) 07/17/2021, 08/08/2021   Zoster, Live 07/08/2005    TDAP status: Up to date  Flu Vaccine status: Declined, Education has been provided  regarding the importance of this vaccine but patient still declined. Advised may receive this vaccine at local pharmacy or Health Dept. Aware to provide a copy of the vaccination record if obtained from local pharmacy or Health Dept. Verbalized acceptance and understanding.  Pneumococcal vaccine status: Up to date  Covid-19 vaccine status: Completed vaccines  Qualifies for Shingles Vaccine? Yes   Zostavax completed No   Shingrix Completed?: No.    Education has been provided regarding the importance of this vaccine. Patient has been advised to call insurance company to determine out of pocket expense if they have not yet received this vaccine. Advised may also receive vaccine at local pharmacy or Health Dept. Verbalized acceptance and understanding.  Screening Tests Health Maintenance  Topic Date Due   COVID-19 Vaccine (4 - 2023-24 season) 04/16/2023 (  Originally 03/09/2023)   Zoster Vaccines- Shingrix (2 of 2) 06/30/2023 (Originally 10/03/2021)   INFLUENZA VACCINE  10/06/2023 (Originally 02/06/2023)   DTaP/Tdap/Td (3 - Tdap) 11/09/2023   Medicare Annual Wellness (AWV)  03/30/2024   DEXA SCAN  01/22/2025   MAMMOGRAM  02/03/2025   Colonoscopy  12/13/2025   Pneumonia Vaccine 60+ Years old  Completed   Hepatitis C Screening  Completed   HPV VACCINES  Aged Out    Health Maintenance  There are no preventive care reminders to display for this patient.   Colorectal cancer screening: Type of screening: Colonoscopy. Completed 12/13/2020. Repeat every 5 years  Mammogram status: Completed 02/04/2023. Repeat every year  Bone Density status: Completed 01/22/2022. Results reflect: Bone density results: OSTEOPENIA. Repeat every 3 years.  Lung Cancer Screening: (Low Dose CT Chest recommended if Age 23-80 years, 20 pack-year currently smoking OR have quit w/in 15years.) does not qualify.   Lung Cancer Screening Referral: N/A  Additional Screening:  Hepatitis C Screening: does qualify; Completed  03/26/2021  Vision Screening: Recommended annual ophthalmology exams for early detection of glaucoma and other disorders of the eye. Is the patient up to date with their annual eye exam?  Yes  Who is the provider or what is the name of the office in which the patient attends annual eye exams? Dr. Elizabeth Sauer If pt is not established with a provider, would they like to be referred to a provider to establish care? No .   Dental Screening: Recommended annual dental exams for proper oral hygiene   Community Resource Referral / Chronic Care Management: CRR required this visit?  No   CCM required this visit?  No     Plan:     I have personally reviewed and noted the following in the patient's chart:   Medical and social history Use of alcohol, tobacco or illicit drugs  Current medications and supplements including opioid prescriptions. Patient is not currently taking opioid prescriptions. Functional ability and status Nutritional status Physical activity Advanced directives List of other physicians Hospitalizations, surgeries, and ER visits in previous 12 months Vitals Screenings to include cognitive, depression, and falls Referrals and appointments  In addition, I have reviewed and discussed with patient certain preventive protocols, quality metrics, and best practice recommendations. A written personalized care plan for preventive services as well as general preventive health recommendations were provided to patient.     Marinus Maw, CMA   03/31/2023   After Visit Summary: (In Person-Printed) AVS printed and given to the patient  Nurse Notes: N/A

## 2023-03-31 NOTE — Assessment & Plan Note (Signed)
Chronic Has lumbar spinal stenosis Following with Dr. Danielle Dess Taking diclofenac 75 mg twice daily as needed-some days does not take it at all and if back pain flares takes it twice a day Discussed trying to take more Tylenol and do other things for her back pain-needs to decrease diclofenac use

## 2023-03-31 NOTE — Assessment & Plan Note (Signed)
New Mild Possibly related to diclofenac use - will try to substitute tylenol ES instead of diclofenac Cmp, cbc today

## 2023-03-31 NOTE — Assessment & Plan Note (Signed)
Chronic Regular exercise and healthy diet encouraged Check lipid panel, CMP, TSH, CBC Continue lifestyle control

## 2023-03-31 NOTE — Patient Instructions (Signed)
It was great speaking with you today!  Please schedule your next Medicare Wellness Visit with your Nurse Health Advisor in 1 year by calling [phone]. 

## 2023-03-31 NOTE — Assessment & Plan Note (Signed)
Chronic Following with ENT On Maxide-37.5-25 daily CMP

## 2023-03-31 NOTE — Assessment & Plan Note (Signed)
Chronic  Clinically euthyroid Check tsh and will titrate med dose if needed Currently taking levothyroxine 50 mcg daily

## 2023-04-03 ENCOUNTER — Other Ambulatory Visit: Payer: Self-pay | Admitting: Internal Medicine

## 2023-04-14 DIAGNOSIS — Z23 Encounter for immunization: Secondary | ICD-10-CM | POA: Diagnosis not present

## 2023-05-16 ENCOUNTER — Ambulatory Visit (INDEPENDENT_AMBULATORY_CARE_PROVIDER_SITE_OTHER): Payer: Medicare Other

## 2023-05-19 ENCOUNTER — Ambulatory Visit (INDEPENDENT_AMBULATORY_CARE_PROVIDER_SITE_OTHER): Payer: Medicare Other | Admitting: Audiology

## 2023-05-19 ENCOUNTER — Ambulatory Visit (INDEPENDENT_AMBULATORY_CARE_PROVIDER_SITE_OTHER): Payer: Medicare Other

## 2023-05-19 ENCOUNTER — Ambulatory Visit (INDEPENDENT_AMBULATORY_CARE_PROVIDER_SITE_OTHER): Payer: Medicare Other | Admitting: Otolaryngology

## 2023-05-19 ENCOUNTER — Encounter (INDEPENDENT_AMBULATORY_CARE_PROVIDER_SITE_OTHER): Payer: Self-pay

## 2023-05-19 VITALS — Ht 62.0 in | Wt 118.0 lb

## 2023-05-19 DIAGNOSIS — H903 Sensorineural hearing loss, bilateral: Secondary | ICD-10-CM | POA: Diagnosis not present

## 2023-05-19 DIAGNOSIS — H6123 Impacted cerumen, bilateral: Secondary | ICD-10-CM | POA: Diagnosis not present

## 2023-05-19 DIAGNOSIS — H8101 Meniere's disease, right ear: Secondary | ICD-10-CM | POA: Diagnosis not present

## 2023-05-19 DIAGNOSIS — H918X3 Other specified hearing loss, bilateral: Secondary | ICD-10-CM

## 2023-05-19 NOTE — Progress Notes (Signed)
  [address] [place], [place] [address] [phone]  Audiological Evaluation    Name: Holly Richard     DOB:   July 29, 1940      MRN:   [id number]                                                                                     Service Date: 05/19/2023     Accompanied by: none   Patient comes today after Dr. Suszanne Conners, ENT sent a referral for a hearing evaluation due to concerns with hearing loss.   Symptoms Yes Details  Hearing loss  [x]  Several months ago perceived a decline in hearing with the weather changes.  Tinnitus  []    Ear pain/ Ear infections  []    Balance problems  [x]  Patient is diagnosed with right Meniere's   Noise exposure  []    Previous ear surgeries  [x]  Patient has  a right cochlear implant that may have been implanted around the 90's by Dr. Suszanne Conners  Family history  []    Amplification  [x]  Currently patient uses a set of CROS silk hearing aids that were fit at an outside location.  Other  []      Otoscopy:   Non occluding wax in both external ear canals.    Tympanometry: Right ear: Type A- Normal external ear canal volume with normal middle ear pressure and tympanic membrane compliance Left ear: Type As- Normal external ear canal volume with normal middle ear pressure and low tympanic membrane compliance    Pure tone Audiometry: Right ear- Severe to profound sensorineural hearing loss from 125 Hz - 8000 Hz.  Left ear-  Normal sloping to profound sensorineural hearing loss from 125 Hz - 8000 Hz.  The hearing test results were completed under headphones and re-checked with inserts and results are deemed to be of good reliability. Test technique:  conventional     Speech Audiometry: Right ear- Speech Awareness Threshold (SAT) was observed at 80 dBHL Left ear-Speech Reception Threshold (SRT) was obtained at 35 dBHL   Word Recognition Score Tested using NU-6 (MLV) Right ear: Could not test due to degree of hearing loss and inability for  the patient to understand words through this ear.   Left ear: 76% was obtained at a presentation level of 75 dBHL with contralateral masking which is deemed as  excellent    Impression: The significant difference in pure-tone thresholds between ears continues to be observed. No significant changes noted.   Recommendations: Follow up with ENT as scheduled for today. Return for a hearing evaluation if concerns with hearing changes arise or per MD recommendation. Use hearing protection when exposed to loud/damaging sounds.  Have hearing aids reprogrammed as deemed necessary by her audiologist or hearing aid dispenser.   Holly Richard, AUD

## 2023-05-21 DIAGNOSIS — H903 Sensorineural hearing loss, bilateral: Secondary | ICD-10-CM | POA: Insufficient documentation

## 2023-05-21 DIAGNOSIS — H6123 Impacted cerumen, bilateral: Secondary | ICD-10-CM | POA: Insufficient documentation

## 2023-05-21 NOTE — Progress Notes (Signed)
Patient ID: Holly Richard, female   DOB: Sep 13, 1940, 82 y.o.   MRN: [id number]  Follow up: Right-sided Meniere's disease, recurrent dizziness, hearing loss  HPI: The patient is an 82 year old female who returns today for her follow-up evaluation. The patient has a history of recurrent dizziness, right-sided Meniere's disease, and profound right ear hearing loss. She was also noted to have fluctuating left ear hearing loss. She was treated with high-dose prednisone on multiple occasions. She was also continued on her low-salt diet and diuretic.  The patient returns today complaining of fluctuating left ear hearing loss 2 months ago.  She was successfully treated with prednisone.  She denies any recent spinning vertigo.  She also denies any otalgia or otorrhea.  She has been using her CROS hearing aids. No other ENT, GI, or respiratory issue noted since the last visit.   Exam: General: Communicates without difficulty, well nourished, no acute distress. Head: Normocephalic, no evidence injury, no tenderness, facial buttresses intact without stepoff. The skin around the [facility] site is clean and dry. Eyes: No scleral icterus, conjunctivae clear. Neuro: CN II exam reveals vision grossly intact. No nystagmus at any point of gaze. Ears: Auricles well formed without lesions.  Bilateral cerumen impaction.  Nose: External evaluation reveals normal support and skin without lesions. Dorsum is intact. Anterior rhinoscopy reveals healthy pink mucosa over anterior aspect of inferior turbinates and intact septum. No purulence noted. Oral:  Oral cavity and oropharynx are intact, symmetric, without erythema or edema. Mucosa is moist without lesions. Neck: Full range of motion without pain. There is no significant lymphadenopathy. No masses palpable. Thyroid bed within normal limits to palpation. Parotid glands and submandibular glands equal bilaterally without mass. Trachea is midline. Neuro:  CN 2-12 grossly intact. Gait  normal. Vestibular: No nystagmus at any point of gaze. The cerebellar examination is unremarkable.   Procedure: Bilateral cerumen disimpaction Anesthesia: None Description: Under the operating microscope, the cerumen is carefully removed with a combination of cerumen currette, alligator forceps, and suction catheters.  After the cerumen is removed, the TMs are noted to be normal.  No mass, erythema, or lesions. The patient tolerated the procedure well.    Her hearing test shows stable hearing thresholds.  Assessment  1.  Bilateral cerumen impaction.  After the disimpaction procedure, the patient's ear canals, tympanic membranes, and middle ear spaces are noted to be normal. 2.  Right ear Mnire's disease. The patient has been experiencing fluctuating left ear hearing loss. She may be experiencing the initial phase of left ear Mnire's disease. 3.  Her hearing thresholds are currently stable.    Plan  1.  Otomicroscopy with bilateral cerumen disimpaction. 2.  The physical exam findings and the hearing test results are reviewed with the patient. 3. Continue with low-salt diet and diuretic. 4. Continues to wear her CROS hearing aids. 5. The patient will return for reevaluation in 6 months.

## 2023-05-26 DIAGNOSIS — H52209 Unspecified astigmatism, unspecified eye: Secondary | ICD-10-CM | POA: Diagnosis not present

## 2023-05-26 DIAGNOSIS — H2512 Age-related nuclear cataract, left eye: Secondary | ICD-10-CM | POA: Diagnosis not present

## 2023-05-27 DIAGNOSIS — H2511 Age-related nuclear cataract, right eye: Secondary | ICD-10-CM | POA: Diagnosis not present

## 2023-05-30 ENCOUNTER — Encounter: Payer: Self-pay | Admitting: Audiology

## 2023-06-09 DIAGNOSIS — H2511 Age-related nuclear cataract, right eye: Secondary | ICD-10-CM | POA: Diagnosis not present

## 2023-06-09 DIAGNOSIS — H25041 Posterior subcapsular polar age-related cataract, right eye: Secondary | ICD-10-CM | POA: Diagnosis not present

## 2023-06-09 DIAGNOSIS — H25011 Cortical age-related cataract, right eye: Secondary | ICD-10-CM | POA: Diagnosis not present

## 2023-06-09 DIAGNOSIS — H52209 Unspecified astigmatism, unspecified eye: Secondary | ICD-10-CM | POA: Diagnosis not present

## 2023-07-07 ENCOUNTER — Other Ambulatory Visit: Payer: Self-pay | Admitting: Internal Medicine

## 2023-08-19 ENCOUNTER — Other Ambulatory Visit: Payer: Self-pay | Admitting: Internal Medicine

## 2023-08-19 DIAGNOSIS — Z1231 Encounter for screening mammogram for malignant neoplasm of breast: Secondary | ICD-10-CM

## 2023-09-30 ENCOUNTER — Other Ambulatory Visit: Payer: Self-pay | Admitting: Internal Medicine

## 2023-11-13 ENCOUNTER — Telehealth: Payer: Self-pay | Admitting: *Deleted

## 2023-11-13 NOTE — Telephone Encounter (Signed)
 LVM to confirm appt for 5/12 - included address and time - [facility] 11/13/23

## 2023-11-17 ENCOUNTER — Encounter (INDEPENDENT_AMBULATORY_CARE_PROVIDER_SITE_OTHER): Payer: Self-pay | Admitting: Otolaryngology

## 2023-11-17 ENCOUNTER — Ambulatory Visit (INDEPENDENT_AMBULATORY_CARE_PROVIDER_SITE_OTHER): Payer: Medicare Other | Admitting: Otolaryngology

## 2023-11-17 DIAGNOSIS — H608X3 Other otitis externa, bilateral: Secondary | ICD-10-CM

## 2023-11-17 DIAGNOSIS — H8101 Meniere's disease, right ear: Secondary | ICD-10-CM

## 2023-11-17 DIAGNOSIS — H6123 Impacted cerumen, bilateral: Secondary | ICD-10-CM

## 2023-11-17 DIAGNOSIS — H903 Sensorineural hearing loss, bilateral: Secondary | ICD-10-CM

## 2023-11-17 DIAGNOSIS — R42 Dizziness and giddiness: Secondary | ICD-10-CM

## 2023-11-17 MED ORDER — MOMETASONE FUROATE 0.1 % EX CREA: TOPICAL_CREAM | CUTANEOUS | 3 refills | Status: AC

## 2023-11-18 DIAGNOSIS — R42 Dizziness and giddiness: Secondary | ICD-10-CM | POA: Insufficient documentation

## 2023-11-18 DIAGNOSIS — H608X3 Other otitis externa, bilateral: Secondary | ICD-10-CM | POA: Insufficient documentation

## 2023-11-18 NOTE — Progress Notes (Signed)
 Follow-up: Recurrent dizziness, hearing loss, right Mnire's disease  HPI: The patient is an 83 year old female who returns today for her follow-up evaluation.  The patient was previously seen for recurrent dizziness and profound right ear hearing loss.  She also has left ear high-frequency sensorineural hearing loss.  She was diagnosed with right ear Mnire's disease.  She was treated with multiple courses of high-dose prednisone .  She was also treated with low-salt diet and diuretic.  The patient returns today reporting no significant dizziness over the past 6 months.  She denies any recent change in her hearing.  She currently uses a CROS hearing aid.  She has a new complaint of itchy sensation in her ears.  She denies any otalgia, otorrhea, or vertigo.  Exam: General: Communicates without difficulty, well nourished, no acute distress. Head: Normocephalic, no evidence injury, no tenderness, facial buttresses intact without stepoff. The skin around the [facility] site is clean and dry. Eyes: No scleral icterus, conjunctivae clear. Neuro: CN II exam reveals vision grossly intact. No nystagmus at any point of gaze. Ears: Auricles well formed without lesions.  Bilateral cerumen impaction.  Nose: External evaluation reveals normal support and skin without lesions. Dorsum is intact. Anterior rhinoscopy reveals healthy pink mucosa over anterior aspect of inferior turbinates and intact septum. No purulence noted. Oral:  Oral cavity and oropharynx are intact, symmetric, without erythema or edema. Mucosa is moist without lesions. Neck: Full range of motion without pain. There is no significant lymphadenopathy. No masses palpable. Thyroid  bed within normal limits to palpation. Parotid glands and submandibular glands equal bilaterally without mass. Trachea is midline. Neuro:  CN 2-12 grossly intact. Gait normal. Vestibular: No nystagmus at any point of gaze. The cerebellar examination is unremarkable.    Procedure:  Bilateral cerumen disimpaction Anesthesia: None Description: Under the operating microscope, the cerumen is carefully removed with a combination of cerumen currette, alligator forceps, and suction catheters.  After the cerumen is removed, the TMs are noted to be normal.  Eczematous changes are noted within the ear canals.  No mass, erythema, or lesions. The patient tolerated the procedure well.    Assessment: 1.  Bilateral cerumen impaction.  After the disimpaction procedure, eczematous changes are noted within the ear canals.  Both tympanic membranes and middle ear spaces are noted to be normal. 2.  Her right ear Mnire's symptoms are currently under control.  She has not had any recent dizziness or significant change in her hearing. 3.  Bilateral chronic eczematous otitis externa.  Plan: 1.  Otomicroscopy with bilateral cerumen disimpaction. 2.  The physical exam findings are reviewed with the patient. 3.  Continue with low-salt diet and diuretic. 4.  Elocon cream to treat the chronic eczematous otitis externa. 5.  Continue the use of her CROS hearing aids. 6.  The patient will return for reevaluation in 6 months.  We will repeat her hearing test at that time.

## 2023-12-26 NOTE — Progress Notes (Signed)
 83 y.o. [id number] postmenopausal female here for annual exam. Married.  She reports ***.  Postmenopausal bleeding: *** Pelvic discharge or pain: *** Breast mass, nipple discharge or skin changes : *** Sexually active: ***   Last PAP: 12/21/18 No results found for: DIAGPAP, HPVHIGH, ADEQPAP Last mammogram: 02/04/23 *** Last DXA: 01/22/22 *** Last colonoscopy: 12/13/20 ***  Exercising: *** Smoker:***  Flowsheet Row Clinical Support from 03/31/2023 in [facility] [place]  PHQ-2 Total Score 0    Flowsheet Row Office Visit from 03/18/2017 in [facility]  PHQ-9 Total Score 2     GYN HISTORY: ***  OB History  Gravida Para Term Preterm AB Living  3 2 2  1 2   SAB IAB Ectopic Multiple Live Births  1    2    # Outcome Date GA Lbr Len/2nd Weight Sex Type Anes PTL Lv  3 SAB           2 Term           1 Term            Past Medical History:  Diagnosis Date   ALLERGIC RHINITIS    ARTHRITIS    Arthritis    LEFT wrist/back   DIVERTICULOSIS, COLON    Hepatitis C antibody test positive 11/2014   negative RNA confirmation test   HSV-1 infection    HYPERLIPIDEMIA    diet controlled   HYPOTHYROIDISM    on meds   MENIERE'S DISEASE    MIGRAINE HEADACHE    OSTEOPENIA    Past Surgical History:  Procedure Laterality Date   (R) Ear implants  10/2008   Hearing loss-coclear implant    BUNIONECTOMY     X's - (R) 2/04 & (L) 3/04   COLONOSCOPY  2017   JP-MAC-suprep(exc)-TA x 1   LUMBAR LAMINECTOMY/DECOMPRESSION MICRODISCECTOMY Left 12/28/2014   Procedure: LUMBAR LAMINECTOMY/DECOMPRESSION MICRODISCECTOMY 2 LEVELS;  Surgeon: Yvonna Herder, MD;  Location: [facility];  Service: Neurosurgery;  Laterality: Left;  Left L34 laminotomy, foraminotomy and poss microdiskectomy with left L45 laminotomy, foraminotomy, resection of synovial cyst and poss microdiskectomy   POLYPECTOMY  2017   TA x 1   TONSILLECTOMY  1954   WISDOM TOOTH  EXTRACTION     Current Outpatient Medications on File Prior to Visit  Medication Sig Dispense Refill   acetaminophen  (TYLENOL ) 500 MG tablet Take 500 mg by mouth every 6 (six) hours as needed for mild pain or moderate pain.     Apoaequorin (PREVAGEN PO) Take 1 tablet by mouth daily at 6 (six) AM.     CALCIUM PO Take by mouth.     cycloSPORINE (RESTASIS OP) Apply 1 drop to eye 2 (two) times daily.     diclofenac  (VOLTAREN ) 75 MG EC tablet Take 75 mg by mouth 2 (two) times daily as needed.     fluticasone (FLONASE) 50 MCG/ACT nasal spray Place 1 spray into both nostrils daily as needed for allergies or rhinitis.     levothyroxine  (SYNTHROID ) 50 MCG tablet Take 1 tablet by mouth once daily 90 tablet 1   mometasone  (ELOCON ) 0.1 % cream Apply topically daily as needed for itch 15 g 3   Multiple Vitamins-Minerals (QC WOMENS DAILY MULTIVITAMIN) TABS Take 2 tablets by mouth daily.     Omega-3 Fatty Acids (FISH OIL TRIPLE STRENGTH) 1400 MG CAPS Take 1 tablet by mouth daily.      Polyethyl Glycol-Propyl Glycol 0.4-0.3 % SOLN Apply 1  drop to eye 2 (two) times daily. Regener eye drops     triamterene -hydrochlorothiazide  (MAXZIDE -25) 37.5-25 MG per tablet Take 1 tablet by mouth daily.     UNABLE TO FIND instaflex joint support take 1 tablet a day     No current facility-administered medications on file prior to visit.   Social History   Socioeconomic History   Marital status: Married    Spouse name: Not on file   Number of children: 2   Years of education: Not on file   Highest education level: Not on file  Occupational History   Not on file  Tobacco Use   Smoking status: Never   Smokeless tobacco: Never  Vaping Use   Vaping status: Never Used  Substance and Sexual Activity   Alcohol use: No    Alcohol/week: 0.0 standard drinks of alcohol   Drug use: No   Sexual activity: Not Currently    Partners: Male    Birth control/protection: Post-menopausal    Comment: 1st intercourse-20,  partners- 1, married- 13yrs  Other Topics Concern   Not on file  Social History Narrative   Married, lives with spouse-2 sons and 4 g-kids nearby.    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   Social Drivers of [employer]: Low Risk  (03/31/2023)   Overall Financial Resource Strain (CARDIA)    Difficulty of Paying Living Expenses: Not hard at all  Food Insecurity: No Food Insecurity (03/31/2023)   Hunger Vital Sign    Worried About Running Out of Food in the Last Year: Never true    Ran Out of Food in the Last Year: Never true  Transportation Needs: No Transportation Needs (03/31/2023)   PRAPARE - Administrator, Civil Service (Medical): No    Lack of Transportation (Non-Medical): No  Physical Activity: Sufficiently Active (03/31/2023)   Exercise Vital Sign    Days of Exercise per Week: 5 days    Minutes of Exercise per Session: 60 min  Stress: No Stress Concern Present (03/31/2023)   [employer] of Occupational Health - Occupational Stress Questionnaire    Feeling of Stress : Not at all  Social Connections: Socially Integrated (03/31/2023)   Social Connection and Isolation Panel    Frequency of Communication with Friends and Family: More than three times a week    Frequency of Social Gatherings with Friends and Family: More than three times a week    Attends Religious Services: More than 4 times per year    Active Member of [employer] or Organizations: Yes    Attends Banker Meetings: 1 to 4 times per year    Marital Status: Married  Catering manager Violence: Not At Risk (03/31/2023)   Humiliation, Afraid, Rape, and Kick questionnaire    Fear of Current or Ex-Partner: No    Emotionally Abused: No    Physically Abused: No    Sexually Abused: No   Family History  Problem Relation Age of Onset   Arthritis Mother    Diabetes Mother    Heart disease Mother    Stroke Mother    Colon cancer Father 47   Stroke Father     Breast cancer Sister    Diabetes Sister    Lung cancer Sister    Diabetes Brother    Prostate cancer Brother    Colon cancer Brother    Brain cancer Other        Nephew   Colon polyps Neg  Hx    Rectal cancer Neg Hx    Stomach cancer Neg Hx    No Known Allergies    PE There were no vitals filed for this visit. There is no height or weight on file to calculate BMI.  Physical Exam    Assessment and Plan:        There are no diagnoses linked to this encounter. Maximino Spanner, CMA

## 2023-12-29 ENCOUNTER — Ambulatory Visit: Payer: Medicare Other | Admitting: Obstetrics and Gynecology

## 2023-12-29 ENCOUNTER — Encounter: Payer: Self-pay | Admitting: Obstetrics and Gynecology

## 2023-12-29 VITALS — BP 98/62 | HR 79 | Temp 98.2°F | Ht 61.5 in | Wt 116.0 lb

## 2023-12-29 DIAGNOSIS — B009 Herpesviral infection, unspecified: Secondary | ICD-10-CM | POA: Diagnosis not present

## 2023-12-29 DIAGNOSIS — Z1331 Encounter for screening for depression: Secondary | ICD-10-CM

## 2023-12-29 DIAGNOSIS — Z9189 Other specified personal risk factors, not elsewhere classified: Secondary | ICD-10-CM | POA: Diagnosis not present

## 2023-12-29 DIAGNOSIS — M8589 Other specified disorders of bone density and structure, multiple sites: Secondary | ICD-10-CM

## 2023-12-29 DIAGNOSIS — Z01419 Encounter for gynecological examination (general) (routine) without abnormal findings: Secondary | ICD-10-CM | POA: Insufficient documentation

## 2023-12-29 NOTE — Assessment & Plan Note (Addendum)
 Cervical cancer screening NO longer indicated Encouraged annual mammogram screening Colonoscopy UTD DXA due July 2025 Labs and immunizations with her primary Encouraged safe sexual practices as indicated Encouraged healthy lifestyle practices with diet and exercise For patients over 81yo, I recommend 1200mg  calcium daily and 800IU of vitamin D  daily. Encouraged kegels for urinary urgency

## 2023-12-29 NOTE — Assessment & Plan Note (Signed)
 2023 DXA with increased FRAX Continue vitamin D +Calcium Encouraged weight based exercise DXA due July 2025 Refer to endocrine for treatment given CKD

## 2023-12-29 NOTE — Patient Instructions (Signed)

## 2024-01-02 ENCOUNTER — Telehealth (HOSPITAL_BASED_OUTPATIENT_CLINIC_OR_DEPARTMENT_OTHER): Payer: Self-pay

## 2024-02-06 DIAGNOSIS — M5440 Lumbago with sciatica, unspecified side: Secondary | ICD-10-CM | POA: Diagnosis not present

## 2024-02-06 DIAGNOSIS — M4316 Spondylolisthesis, lumbar region: Secondary | ICD-10-CM | POA: Diagnosis not present

## 2024-02-09 ENCOUNTER — Ambulatory Visit
Admission: RE | Admit: 2024-02-09 | Discharge: 2024-02-09 | Disposition: A | Discharge status: Home / Self Care | Payer: Medicare Other | Source: Ambulatory Visit | Attending: Internal Medicine

## 2024-02-09 DIAGNOSIS — Z1231 Encounter for screening mammogram for malignant neoplasm of breast: Secondary | ICD-10-CM | POA: Diagnosis not present

## 2024-02-11 ENCOUNTER — Ambulatory Visit: Payer: Self-pay | Admitting: Obstetrics and Gynecology

## 2024-03-10 ENCOUNTER — Telehealth: Payer: Self-pay

## 2024-03-10 ENCOUNTER — Telehealth (HOSPITAL_BASED_OUTPATIENT_CLINIC_OR_DEPARTMENT_OTHER): Payer: Self-pay

## 2024-03-10 NOTE — Telephone Encounter (Signed)
 Patient called & said someone from [facility] called her to schedule her bmd but she would rather go to [facility] instead. I gave her the number to [facility]. I told her they may be booked out a while.

## 2024-03-16 DIAGNOSIS — H35342 Macular cyst, hole, or pseudohole, left eye: Secondary | ICD-10-CM | POA: Diagnosis not present

## 2024-03-16 DIAGNOSIS — H16223 Keratoconjunctivitis sicca, not specified as Sjogren's, bilateral: Secondary | ICD-10-CM | POA: Diagnosis not present

## 2024-03-16 DIAGNOSIS — H35373 Puckering of macula, bilateral: Secondary | ICD-10-CM | POA: Diagnosis not present

## 2024-03-16 DIAGNOSIS — H18593 Other hereditary corneal dystrophies, bilateral: Secondary | ICD-10-CM | POA: Diagnosis not present

## 2024-03-23 ENCOUNTER — Other Ambulatory Visit: Payer: Self-pay | Admitting: Internal Medicine

## 2024-04-08 ENCOUNTER — Ambulatory Visit (HOSPITAL_BASED_OUTPATIENT_CLINIC_OR_DEPARTMENT_OTHER)
Admission: RE | Admit: 2024-04-08 | Discharge: 2024-04-08 | Disposition: A | Discharge status: Home / Self Care | Source: Ambulatory Visit | Attending: Obstetrics and Gynecology | Admitting: Obstetrics and Gynecology

## 2024-04-08 DIAGNOSIS — Z9189 Other specified personal risk factors, not elsewhere classified: Secondary | ICD-10-CM | POA: Diagnosis not present

## 2024-04-08 DIAGNOSIS — M810 Age-related osteoporosis without current pathological fracture: Secondary | ICD-10-CM | POA: Diagnosis not present

## 2024-04-08 DIAGNOSIS — M8589 Other specified disorders of bone density and structure, multiple sites: Secondary | ICD-10-CM | POA: Diagnosis not present

## 2024-04-12 ENCOUNTER — Encounter: Payer: Self-pay | Admitting: Internal Medicine

## 2024-04-12 DIAGNOSIS — R739 Hyperglycemia, unspecified: Secondary | ICD-10-CM | POA: Insufficient documentation

## 2024-04-12 NOTE — Patient Instructions (Incomplete)
      Blood work was ordered.       Medications changes include :   None    A referral was ordered and someone will call you to schedule an appointment.     No follow-ups on file.

## 2024-04-12 NOTE — Progress Notes (Unsigned)
 Subjective:    Patient ID: Holly Richard, female    DOB: [birth year], 83 y.o.   MRN: [id number]     HPI Cheyna is here for follow up of her chronic medical problems.  She walks 3 days a week and does yoga 3 days a week.  She had a recent bone density test done.  She would prefer not to take any medication.  Her gynecologist has referred her to an endocrinologist and she is thinking about canceling that.  She denies any falls.  She has been exercising regularly over the past year.     Medications and allergies reviewed with patient and updated if appropriate.  Current Outpatient Medications on File Prior to Visit  Medication Sig Dispense Refill   acetaminophen  (TYLENOL ) 500 MG tablet Take 500 mg by mouth every 6 (six) hours as needed for mild pain or moderate pain.     Apoaequorin (PREVAGEN PO) Take 1 tablet by mouth daily at 6 (six) AM.     CALCIUM PO Take by mouth.     cycloSPORINE (RESTASIS OP) Apply 1 drop to eye 2 (two) times daily.     levothyroxine  (SYNTHROID ) 50 MCG tablet Take 1 tablet by mouth once daily 90 tablet 0   Multiple Vitamins-Minerals (QC WOMENS DAILY MULTIVITAMIN) TABS Take 2 tablets by mouth daily.     Omega-3 Fatty Acids (FISH OIL TRIPLE STRENGTH) 1400 MG CAPS Take 1 tablet by mouth daily.      triamterene -hydrochlorothiazide  (MAXZIDE -25) 37.5-25 MG per tablet Take 1 tablet by mouth daily.     UNABLE TO FIND instaflex joint support take 1 tablet a day     mometasone  (ELOCON ) 0.1 % cream Apply topically daily as needed for itch (Patient not taking: Reported on 04/13/2024) 15 g 3   No current facility-administered medications on file prior to visit.     Review of Systems  Constitutional:  Negative for fever.  Eyes:  Negative for visual disturbance.  Respiratory:  Negative for cough, shortness of breath and wheezing.   Cardiovascular:  Negative for chest pain, palpitations and leg swelling.  Gastrointestinal:  Positive for constipation (mild,  occ). Negative for abdominal pain, blood in stool and diarrhea.       No gerd  Genitourinary:  Negative for dysuria.  Musculoskeletal:  Positive for back pain.  Skin:  Negative for rash.  Neurological:  Negative for dizziness, light-headedness and headaches.  Psychiatric/Behavioral:  Negative for dysphoric mood. The patient is not nervous/anxious.        Objective:   Vitals:   04/13/24 1037  BP: 128/82  Pulse: 74  SpO2: 98%   BP Readings from Last 3 Encounters:  04/13/24 128/82  04/13/24 128/82  12/29/23 98/62   Wt Readings from Last 3 Encounters:  04/13/24 118 lb (53.5 kg)  04/13/24 118 lb 9.6 oz (53.8 kg)  12/29/23 116 lb (52.6 kg)   Body mass index is 21.93 kg/m.    Physical Exam Constitutional:      General: She is not in acute distress.    Appearance: Normal appearance.  HENT:     Head: Normocephalic and atraumatic.  Eyes:     Conjunctiva/sclera: Conjunctivae normal.  Cardiovascular:     Rate and Rhythm: Normal rate and regular rhythm.     Heart sounds: Normal heart sounds.  Pulmonary:     Effort: Pulmonary effort is normal. No respiratory distress.     Breath sounds: Normal breath sounds. No wheezing.  Abdominal:  General: There is no distension.     Palpations: Abdomen is soft.     Tenderness: There is no abdominal tenderness.  Musculoskeletal:     Cervical back: Neck supple.     Right lower leg: No edema.     Left lower leg: No edema.  Lymphadenopathy:     Cervical: No cervical adenopathy.  Skin:    General: Skin is warm and dry.     Findings: No rash.  Neurological:     Mental Status: She is alert. Mental status is at baseline.  Psychiatric:        Mood and Affect: Mood normal.        Behavior: Behavior normal.        Lab Results  Component Value Date   WBC 6.1 03/31/2023   HGB 14.3 03/31/2023   HCT 42.3 03/31/2023   PLT 275.0 03/31/2023   GLUCOSE 110 (H) 03/31/2023   CHOL 253 (H) 03/31/2023   TRIG 70.0 03/31/2023   HDL 80.40  03/31/2023   LDLDIRECT 126.8 11/04/2012   LDLCALC 159 (H) 03/31/2023   ALT 18 03/31/2023   AST 15 03/31/2023   NA 141 03/31/2023   K 4.0 03/31/2023   CL 102 03/31/2023   CREATININE 0.99 03/31/2023   BUN 21 03/31/2023   CO2 28 03/31/2023   TSH 0.89 03/31/2023     Assessment & Plan:    See Problem List for Assessment and Plan of chronic medical problems.   Had wellness visit today

## 2024-04-13 ENCOUNTER — Ambulatory Visit: Payer: Medicare Other

## 2024-04-13 ENCOUNTER — Ambulatory Visit: Payer: Medicare Other | Admitting: Internal Medicine

## 2024-04-13 VITALS — BP 128/82 | HR 74 | Ht 61.5 in | Wt 118.6 lb

## 2024-04-13 VITALS — BP 128/82 | HR 74 | Ht 61.5 in | Wt 118.0 lb

## 2024-04-13 DIAGNOSIS — M8589 Other specified disorders of bone density and structure, multiple sites: Secondary | ICD-10-CM

## 2024-04-13 DIAGNOSIS — Z0000 Encounter for general adult medical examination without abnormal findings: Secondary | ICD-10-CM

## 2024-04-13 DIAGNOSIS — R739 Hyperglycemia, unspecified: Secondary | ICD-10-CM

## 2024-04-13 DIAGNOSIS — N1831 Chronic kidney disease, stage 3a: Secondary | ICD-10-CM | POA: Diagnosis not present

## 2024-04-13 DIAGNOSIS — E039 Hypothyroidism, unspecified: Secondary | ICD-10-CM | POA: Diagnosis not present

## 2024-04-13 DIAGNOSIS — H8101 Meniere's disease, right ear: Secondary | ICD-10-CM

## 2024-04-13 DIAGNOSIS — E782 Mixed hyperlipidemia: Secondary | ICD-10-CM

## 2024-04-13 DIAGNOSIS — M810 Age-related osteoporosis without current pathological fracture: Secondary | ICD-10-CM | POA: Diagnosis not present

## 2024-04-13 DIAGNOSIS — M545 Low back pain, unspecified: Secondary | ICD-10-CM | POA: Diagnosis not present

## 2024-04-13 DIAGNOSIS — G8929 Other chronic pain: Secondary | ICD-10-CM

## 2024-04-13 LAB — COMPREHENSIVE METABOLIC PANEL WITH GFR
ALT: 16 U/L (ref 0–35)
AST: 16 U/L (ref 0–37)
Albumin: 4.7 g/dL (ref 3.5–5.2)
Alkaline Phosphatase: 55 U/L (ref 39–117)
BUN: 22 mg/dL (ref 6–23)
CO2: 31 meq/L (ref 19–32)
Calcium: 10.4 mg/dL (ref 8.4–10.5)
Chloride: 99 meq/L (ref 96–112)
Creatinine, Ser: 0.86 mg/dL (ref 0.40–1.20)
GFR: 63.54 mL/min (ref >60.00)
Glucose, Bld: 88 mg/dL (ref 70–99)
Potassium: 3.5 meq/L (ref 3.5–5.1)
Sodium: 140 meq/L (ref 135–145)
Total Bilirubin: 0.8 mg/dL (ref 0.2–1.2)
Total Protein: 7.1 g/dL (ref 6.0–8.3)

## 2024-04-13 LAB — CBC WITH DIFFERENTIAL/PLATELET
Basophils Absolute: 0 K/uL (ref 0.0–0.1)
Basophils Relative: 0.6 % (ref 0.0–3.0)
Eosinophils Absolute: 0 K/uL (ref 0.0–0.7)
Eosinophils Relative: 0.6 % (ref 0.0–5.0)
HCT: 44 % (ref 36.0–46.0)
Hemoglobin: 14.7 g/dL (ref 12.0–15.0)
Lymphocytes Relative: 34.7 % (ref 12.0–46.0)
Lymphs Abs: 2.1 K/uL (ref 0.7–4.0)
MCHC: 33.4 g/dL (ref 30.0–36.0)
MCV: 92.5 fl (ref 78.0–100.0)
Monocytes Absolute: 0.4 K/uL (ref 0.1–1.0)
Monocytes Relative: 6.5 % (ref 3.0–12.0)
Neutro Abs: 3.4 K/uL (ref 1.4–7.7)
Neutrophils Relative %: 57.6 % (ref 43.0–77.0)
Platelets: 239 K/uL (ref 150.0–400.0)
RBC: 4.76 Mil/uL (ref 3.87–5.11)
RDW: 13.6 % (ref 11.5–15.5)
WBC: 5.9 K/uL (ref 4.0–10.5)

## 2024-04-13 LAB — LIPID PANEL
Cholesterol: 211 mg/dL — ABNORMAL HIGH (ref 0–200)
HDL: 70.8 mg/dL (ref >39.00)
LDL Cholesterol: 125 mg/dL — ABNORMAL HIGH (ref 0–99)
NonHDL: 140.11
Total CHOL/HDL Ratio: 3
Triglycerides: 74 mg/dL (ref 0.0–149.0)
VLDL: 14.8 mg/dL (ref 0.0–40.0)

## 2024-04-13 LAB — TSH: TSH: 1.53 u[IU]/mL (ref 0.35–5.50)

## 2024-04-13 LAB — VITAMIN D 25 HYDROXY (VIT D DEFICIENCY, FRACTURES): VITD: 54.58 ng/mL (ref 30.00–100.00)

## 2024-04-13 LAB — HEMOGLOBIN A1C: Hgb A1c MFr Bld: 6.1 % (ref 4.6–6.5)

## 2024-04-13 NOTE — Assessment & Plan Note (Signed)
 Chronic Regular exercise and healthy diet encouraged Check lipid panel, CMP, TSH, CBC Continue lifestyle control

## 2024-04-13 NOTE — Assessment & Plan Note (Addendum)
 Chronic Controlled-no flares Following with ENT On Maxide-37.5-25 daily CMP

## 2024-04-13 NOTE — Progress Notes (Signed)
 Subjective:   Holly Richard is a 83 y.o. who presents for a Medicare Wellness preventive visit.  As a reminder, Annual Wellness Visits don't include a physical exam, and some assessments may be limited, especially if this visit is performed virtually. We may recommend an in-person follow-up visit with your provider if needed.  Visit Complete: In person   Persons Participating in Visit: Patient.  AWV Questionnaire: Yes: Patient Medicare AWV questionnaire was completed by the patient on 04/09/2024; I have confirmed that all information answered by patient is correct and no changes since this date.  Cardiac Risk Factors include: advanced age (>57men, >38 women);dyslipidemia     Objective:    Today's Vitals   04/13/24 0840  BP: 128/82  Pulse: 74  SpO2: 98%  Weight: 118 lb 9.6 oz (53.8 kg)  Height: 5' 1.5 (1.562 m)   Body mass index is 22.05 kg/m.     04/13/2024    8:40 AM 03/31/2023    8:43 AM 03/27/2022    9:39 AM 03/24/2020    8:54 AM 03/24/2019    8:25 AM 03/19/2018   10:02 AM 03/18/2017    1:37 PM  Advanced Directives  Does Patient Have a Medical Advance Directive? Yes Yes Yes Yes No;Yes Yes  Yes   Type of Estate agent of [place];Living will Healthcare Power of [place];Living will Healthcare Power of [place];Living will Healthcare Power of [place];Living will Healthcare Power of [place];Living will Healthcare Power of [place];Living will Healthcare Power of [place];Living will  Does patient want to make changes to medical advance directive?  No - Patient declined  No - Patient declined     Copy of Healthcare Power of Attorney in Chart? No - copy requested  No - copy requested No - copy requested No - copy requested No - copy requested  No - copy requested      Data saved with a previous flowsheet row definition    Current Medications (verified) Outpatient Encounter Medications as of 04/13/2024  Medication Sig   acetaminophen  (TYLENOL )  500 MG tablet Take 500 mg by mouth every 6 (six) hours as needed for mild pain or moderate pain.   Apoaequorin (PREVAGEN PO) Take 1 tablet by mouth daily at 6 (six) AM.   CALCIUM PO Take by mouth.   cycloSPORINE (RESTASIS OP) Apply 1 drop to eye 2 (two) times daily.   levothyroxine  (SYNTHROID ) 50 MCG tablet Take 1 tablet by mouth once daily   Multiple Vitamins-Minerals (QC WOMENS DAILY MULTIVITAMIN) TABS Take 2 tablets by mouth daily.   Omega-3 Fatty Acids (FISH OIL TRIPLE STRENGTH) 1400 MG CAPS Take 1 tablet by mouth daily.    triamterene -hydrochlorothiazide  (MAXZIDE -25) 37.5-25 MG per tablet Take 1 tablet by mouth daily.   UNABLE TO FIND instaflex joint support take 1 tablet a day   mometasone  (ELOCON ) 0.1 % cream Apply topically daily as needed for itch (Patient not taking: Reported on 04/13/2024)   No facility-administered encounter medications on file as of 04/13/2024.    Allergies (verified) Patient has no known allergies.   History: Past Medical History:  Diagnosis Date   ALLERGIC RHINITIS    ARTHRITIS    Arthritis    LEFT wrist/back   DIVERTICULOSIS, COLON    Hepatitis C antibody test positive 11/2014   negative RNA confirmation test   HSV-1 infection    HYPERLIPIDEMIA    diet controlled   HYPOTHYROIDISM    on meds   MENIERE'S DISEASE    MIGRAINE HEADACHE  OSTEOPENIA    Past Surgical History:  Procedure Laterality Date   (R) Ear implants  10/2008   Hearing loss-coclear implant    BUNIONECTOMY     X's - (R) 2/04 & (L) 3/04   COLONOSCOPY  2017   JP-MAC-suprep(exc)-TA x 1   LUMBAR LAMINECTOMY/DECOMPRESSION MICRODISCECTOMY Left 12/28/2014   Procedure: LUMBAR LAMINECTOMY/DECOMPRESSION MICRODISCECTOMY 2 LEVELS;  Surgeon: Lamar Peaches, MD;  Location: [facility];  Service: Neurosurgery;  Laterality: Left;  Left L34 laminotomy, foraminotomy and poss microdiskectomy with left L45 laminotomy, foraminotomy, resection of synovial cyst and poss microdiskectomy   POLYPECTOMY   2017   TA x 1   TONSILLECTOMY  1954   WISDOM TOOTH EXTRACTION     Family History  Problem Relation Age of Onset   Arthritis Mother    Diabetes Mother    Heart disease Mother    Stroke Mother    Colon cancer Father 41   Stroke Father    Breast cancer Sister    Diabetes Sister    Lung cancer Sister    Diabetes Brother    Prostate cancer Brother    Colon cancer Brother    Brain cancer Other        Nephew   Colon polyps Neg Hx    Rectal cancer Neg Hx    Stomach cancer Neg Hx    Social History   Socioeconomic History   Marital status: Married    Spouse name: Not on file   Number of children: 2   Years of education: Not on file   Highest education level: 12th grade  Occupational History   Not on file  Tobacco Use   Smoking status: Never   Smokeless tobacco: Never  Vaping Use   Vaping status: Never Used  Substance and Sexual Activity   Alcohol use: No    Alcohol/week: 0.0 standard drinks of alcohol   Drug use: No   Sexual activity: Not Currently    Partners: Male    Birth control/protection: Post-menopausal    Comment: 1st intercourse-20, partners- 1, married- 42yrs  Other Topics Concern   Not on file  Social History Narrative   Married, lives with spouse-2 sons and 4 g-kids nearby.    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   Social Drivers of [employer]: Low Risk  (04/13/2024)   Overall Financial Resource Strain (CARDIA)    Difficulty of Paying Living Expenses: Not hard at all  Food Insecurity: No Food Insecurity (04/13/2024)   Hunger Vital Sign    Worried About Running Out of Food in the Last Year: Never true    Ran Out of Food in the Last Year: Never true  Transportation Needs: No Transportation Needs (04/13/2024)   PRAPARE - Administrator, Civil Service (Medical): No    Lack of Transportation (Non-Medical): No  Physical Activity: Sufficiently Active (04/13/2024)   Exercise Vital Sign    Days of Exercise per  Week: 5 days    Minutes of Exercise per Session: 40 min  Stress: No Stress Concern Present (04/13/2024)   [employer] of Occupational Health - Occupational Stress Questionnaire    Feeling of Stress: Only a little  Social Connections: Socially Integrated (04/13/2024)   Social Connection and Isolation Panel    Frequency of Communication with Friends and Family: More than three times a week    Frequency of Social Gatherings with Friends and Family: More than three times a week    Attends  Religious Services: More than 4 times per year    Active Member of Clubs or Organizations: Yes    Attends Banker Meetings: More than 4 times per year    Marital Status: Married    Tobacco Counseling Counseling given: Not Answered    Clinical Intake:  Pre-visit preparation completed: Yes  Pain : No/denies pain     BMI - recorded: 22.05 Nutritional Status: BMI of 19-24  Normal Nutritional Risks: None Diabetes: No  No results found for: HGBA1C   How often do you need to have someone help you when you read instructions, pamphlets, or other written materials from your doctor or pharmacy?: 1 - Never  Interpreter Needed?: No  Information entered by :: Verdie Saba, CMA   Activities of Daily Living     04/13/2024    8:45 AM  In your present state of health, do you have any difficulty performing the following activities:  Hearing? 0  Comment wears hearing aids  Vision? 0  Difficulty concentrating or making decisions? 0  Walking or climbing stairs? 0  Dressing or bathing? 0  Doing errands, shopping? 0  Preparing Food and eating ? N  Using the Toilet? N  In the past six months, have you accidently leaked urine? Y  Comment wears a pantyliner  Do you have problems with loss of bowel control? N  Managing your Medications? N  Managing your Finances? N  Housekeeping or managing your Housekeeping? N    Patient Care Team: Geofm Glade PARAS, MD as PCP - General (Internal  Medicine) Karis Clunes, MD (Otolaryngology) Alix Charleston, MD (Neurosurgery) Bonner Ade, MD (Physical Medicine and Rehabilitation) Lavoie, Marie-Lyne, MD as Consulting Physician (Obstetrics and Gynecology) Vernona Slough, OD (Ophthalmology)  I have updated your Care Teams any recent Medical Services you may have received from other providers in the past year.     Assessment:   This is a routine wellness examination for Holly Richard.  Hearing/Vision screen Hearing Screening - Comments:: Denies hearing difficulties   Vision Screening - Comments:: Wears rx glasses - up to date with routine eye exams with Slough Vernona   Goals Addressed               This Visit's Progress     Patient Stated (pt-stated)        Patient stated she plans to continue to stay healthy - keep walking and do yard work       Depression Screen     04/13/2024    8:46 AM 12/29/2023    9:53 AM 03/31/2023    8:33 AM 03/27/2022    9:25 AM 03/24/2020    8:13 AM 03/24/2019    8:26 AM 03/19/2018    9:33 AM  PHQ 2/9 Scores  PHQ - 2 Score 0 0 0 0 0 0 0  PHQ- 9 Score 1          Fall Risk     04/13/2024    8:45 AM 12/29/2023    9:53 AM 03/31/2023    8:32 AM 03/27/2022   10:38 AM 03/27/2022    9:26 AM  Fall Risk   Falls in the past year? 1 0 0 0 1  Number falls in past yr: 0 0 0 0 0  Comment 1      Injury with Fall? 0 0 0 0 0  Risk for fall due to : Impaired balance/gait No Fall Risks No Fall Risks No Fall Risks No Fall Risks  Follow up  Falls evaluation completed;Falls prevention discussed Falls evaluation completed Falls evaluation completed Falls evaluation completed  Falls prevention discussed      Data saved with a previous flowsheet row definition    MEDICARE RISK AT HOME:  Medicare Risk at Home Any stairs in or around the home?: Yes (outside) If so, are there any without handrails?: No Home free of loose throw rugs in walkways, pet beds, electrical cords, etc?: Yes Adequate lighting in your home  to reduce risk of falls?: Yes Life alert?: No Use of a cane, walker or w/c?: Yes (cane (prn)) Grab bars in the bathroom?: Yes Shower chair or bench in shower?: No Elevated toilet seat or a handicapped toilet?: No  TIMED UP AND GO:  Was the test performed?  No  Cognitive Function: 6CIT completed    03/19/2018   10:03 AM 03/18/2017    1:44 PM  MMSE - Mini Mental State Exam  Orientation to time 5 5   Orientation to Place 5 5   Registration 3 3   Attention/ Calculation 5 5   Recall 3 3   Language- name 2 objects 2 2   Language- repeat 1 1  Language- follow 3 step command 3 3   Language- read & follow direction 1 1   Write a sentence 1 1   Copy design 1 1   Total score 30 30      Data saved with a previous flowsheet row definition        04/13/2024    8:48 AM 03/31/2023    8:44 AM 03/27/2022    9:26 AM 03/24/2020    8:58 AM  6CIT Screen  What Year? 0 points 0 points 0 points 0 points  What month? 0 points 0 points 0 points 0 points  What time? 0 points 0 points 0 points 0 points  Count back from 20 0 points 0 points 0 points 0 points  Months in reverse 0 points 0 points 0 points 0 points  Repeat phrase 0 points 0 points 0 points 0 points  Total Score 0 points 0 points 0 points 0 points    Immunizations Immunization History  Administered Date(s) Administered   Fluad Quad(high Dose 65+) 03/24/2019, 03/24/2020   Hep A / Hep B 02/28/2016, 06/11/2016   Hepatitis B, ADULT 04/01/2016   INFLUENZA, HIGH DOSE SEASONAL PF 03/18/2017, 03/19/2018   Influenza Split 04/23/2011, 04/07/2012, 03/29/2013   Influenza,inj,Quad PF,6+ Mos 02/28/2016   Moderna Sars-Covid-2 Vaccination 07/28/2019, 08/25/2019, 05/05/2020   Pneumococcal Conjugate-13 02/27/2015   Pneumococcal Polysaccharide-23 07/10/2004, 09/05/2009   Td 06/19/2003, 11/08/2013   Zoster Recombinant(Shingrix) 07/17/2021, 08/08/2021   Zoster, Live 07/08/2005    Screening Tests Health Maintenance  Topic Date Due   Zoster  Vaccines- Shingrix (2 of 2) 10/03/2021   DTaP/Tdap/Td (3 - Tdap) 11/09/2023   Influenza Vaccine  02/06/2024   COVID-19 Vaccine (4 - 2025-26 season) 03/08/2024   Medicare Annual Wellness (AWV)  04/13/2025   Mammogram  02/08/2026   DEXA SCAN  04/09/2027   Pneumococcal Vaccine: 50+ Years  Completed   Meningococcal B Vaccine  Aged Out   Hepatitis B Vaccines 19-59 Average Risk  Discontinued   Colonoscopy  Discontinued   Hepatitis C Screening  Discontinued    Health Maintenance Items Addressed:04/13/2024  Additional Screening:  Vision Screening: Recommended annual ophthalmology exams for early detection of glaucoma and other disorders of the eye. Is the patient up to date with their annual eye exam?  Yes  Who is the provider  or what is the name of the office in which the patient attends annual eye exams? [facility]  Dental Screening: Recommended annual dental exams for proper oral hygiene  Community Resource Referral / Chronic Care Management: CRR required this visit?  No   CCM required this visit?  No   Plan:    I have personally reviewed and noted the following in the patient's chart:   Medical and social history Use of alcohol, tobacco or illicit drugs  Current medications and supplements including opioid prescriptions. Patient is not currently taking opioid prescriptions. Functional ability and status Nutritional status Physical activity Advanced directives List of other physicians Hospitalizations, surgeries, and ER visits in previous 12 months Vitals Screenings to include cognitive, depression, and falls Referrals and appointments  In addition, I have reviewed and discussed with patient certain preventive protocols, quality metrics, and best practice recommendations. A written personalized care plan for preventive services as well as general preventive health recommendations were provided to patient.   Verdie CHRISTELLA Saba, CMA   04/13/2024   After Visit Summary:  (In Person-Declined) Patient declined AVS at this time.  Notes: Nothing significant to report at this time.

## 2024-04-13 NOTE — Assessment & Plan Note (Signed)
 Chronic  Mild Possibly related to diclofenac  use - will try to substitute tylenol  ES instead of diclofenac  Cmp, cbc, vitamin D  level

## 2024-04-13 NOTE — Assessment & Plan Note (Signed)
 Chronic Following with Dr. Colon Has started chair yoga and that has made a difference in her back pain

## 2024-04-13 NOTE — Patient Instructions (Signed)
 Holly Richard,  Thank you for taking the time for your Medicare Wellness Visit. I appreciate your continued commitment to your health goals. Please review the care plan we discussed, and feel free to reach out if I can assist you further.  Medicare recommends these wellness visits once per year to help you and your care team stay ahead of potential health issues. These visits are designed to focus on prevention, allowing your provider to concentrate on managing your acute and chronic conditions during your regular appointments.  Please note that Annual Wellness Visits do not include a physical exam. Some assessments may be limited, especially if the visit was conducted virtually. If needed, we may recommend a separate in-person follow-up with your provider.  Ongoing Care Seeing your primary care provider every 3 to 6 months helps us  monitor your health and provide consistent, personalized care.   Referrals If a referral was made during today's visit and you haven't received any updates within two weeks, please contact the referred provider directly to check on the status.  Recommended Screenings:  Health Maintenance  Topic Date Due   Zoster (Shingles) Vaccine (2 of 2) 10/03/2021   DTaP/Tdap/Td vaccine (3 - Tdap) 11/09/2023   Flu Shot  02/06/2024   COVID-19 Vaccine (4 - 2025-26 season) 03/08/2024   Medicare Annual Wellness Visit  04/13/2025   Breast Cancer Screening  02/08/2026   DEXA scan (bone density measurement)  04/09/2027   Pneumococcal Vaccine for age over 11  Completed   Meningitis B Vaccine  Aged Out   Hepatitis B Vaccine  Discontinued   Colon Cancer Screening  Discontinued   Hepatitis C Screening  Discontinued       03/31/2023    8:43 AM  Advanced Directives  Does Patient Have a Medical Advance Directive? Yes  Type of Estate agent of [place];Living will  Does patient want to make changes to medical advance directive? No - Patient declined   Advance  Care Planning is important because it: Ensures you receive medical care that aligns with your values, goals, and preferences. Provides guidance to your family and loved ones, reducing the emotional burden of decision-making during critical moments.  Vision: Annual vision screenings are recommended for early detection of glaucoma, cataracts, and diabetic retinopathy. These exams can also reveal signs of chronic conditions such as diabetes and high blood pressure.  Dental: Annual dental screenings help detect early signs of oral cancer, gum disease, and other conditions linked to overall health, including heart disease and diabetes.

## 2024-04-13 NOTE — Assessment & Plan Note (Signed)
 Chronic  Clinically euthyroid Check tsh and will titrate med dose if needed Currently taking levothyroxine  50 mcg daily

## 2024-04-13 NOTE — Assessment & Plan Note (Signed)
Chronic Mild Check A1c

## 2024-04-13 NOTE — Assessment & Plan Note (Addendum)
 Chronic DEXA up-to-date-reviewed most recent bone density-she does have osteoporosis in her radius and osteopenia in bilateral femoral necks Bone density in bilateral hips stable and bone density in radius has not been done previously Continue calcium and vitamin D  supplementation Check vitamin D  level Continue regular exercise-currently doing walking and yoga both 3 days a week I think this is making an impact She would really like to avoid medication-discussed increased risk of breast fracture with fall Will hold off on medication Recheck DEXA in 2 years

## 2024-04-14 LAB — PTH, INTACT AND CALCIUM
Calcium: 10.3 mg/dL (ref 8.6–10.4)
PTH: 19 pg/mL (ref 16–77)

## 2024-04-15 ENCOUNTER — Ambulatory Visit: Payer: Self-pay | Admitting: Obstetrics and Gynecology

## 2024-04-15 DIAGNOSIS — Z23 Encounter for immunization: Secondary | ICD-10-CM | POA: Diagnosis not present

## 2024-04-16 ENCOUNTER — Ambulatory Visit: Payer: Self-pay | Admitting: Internal Medicine

## 2024-05-04 ENCOUNTER — Ambulatory Visit: Admitting: Endocrinology

## 2024-05-18 ENCOUNTER — Other Ambulatory Visit (INDEPENDENT_AMBULATORY_CARE_PROVIDER_SITE_OTHER): Payer: Self-pay | Admitting: Otolaryngology

## 2024-05-18 DIAGNOSIS — H903 Sensorineural hearing loss, bilateral: Secondary | ICD-10-CM

## 2024-05-21 ENCOUNTER — Encounter (INDEPENDENT_AMBULATORY_CARE_PROVIDER_SITE_OTHER): Payer: Self-pay | Admitting: Otolaryngology

## 2024-05-21 ENCOUNTER — Ambulatory Visit (INDEPENDENT_AMBULATORY_CARE_PROVIDER_SITE_OTHER): Admitting: Audiology

## 2024-05-21 ENCOUNTER — Ambulatory Visit (INDEPENDENT_AMBULATORY_CARE_PROVIDER_SITE_OTHER): Admitting: Otolaryngology

## 2024-05-21 VITALS — BP 117/69 | HR 78 | Temp 98.2°F | Ht 61.5 in | Wt 118.0 lb

## 2024-05-21 DIAGNOSIS — H903 Sensorineural hearing loss, bilateral: Secondary | ICD-10-CM

## 2024-05-21 DIAGNOSIS — R42 Dizziness and giddiness: Secondary | ICD-10-CM

## 2024-05-21 DIAGNOSIS — H8101 Meniere's disease, right ear: Secondary | ICD-10-CM

## 2024-05-21 NOTE — Progress Notes (Signed)
 Patient ID: Holly Richard, female   DOB: [birth year], 83 y.o.   MRN: [id number]  Follow-up: Recurrent dizziness, right ear Mnire's disease, bilateral sensorineural hearing loss  HPI: The patient is an 83 year old female who returns today for her follow-up evaluation.  The patient has a history of recurrent dizziness and asymmetric profound right ear hearing loss.  She also has left high-frequency sensorineural hearing loss.  She was diagnosed with right ear Mnire's disease.  She was treated with a 1500 mg low-salt diet and Dyazide  diuretic.  She uses a CROS hearing aid.  The patient returns today reporting no significant dizziness over the past 6 months.  She has not noted any recent change in her hearing.  The CROS hearing aid has been helpful.  Currently she denies any otalgia, otorrhea, or vertigo.  Exam: General: Communicates without difficulty, well nourished, no acute distress. Head: Normocephalic, no evidence injury, no tenderness, facial buttresses intact without stepoff. Face/sinus: No tenderness to palpation and percussion. Facial movement is normal and symmetric. Eyes: PERRL, EOMI. No scleral icterus, conjunctivae clear. Neuro: CN II exam reveals vision grossly intact.  No nystagmus at any point of gaze. Ears: Auricles well formed without lesions.  Ear canals are intact without mass or lesion.  No erythema or edema is appreciated.  The TMs are intact without fluid. Nose: External evaluation reveals normal support and skin without lesions.  Dorsum is intact.  Anterior rhinoscopy reveals normal mucosa over anterior aspect of inferior turbinates and intact septum.  No purulence noted. Oral:  Oral cavity and oropharynx are intact, symmetric, without erythema or edema.  Mucosa is moist without lesions. Neck: Full range of motion without pain.  There is no significant lymphadenopathy.  No masses palpable.  Thyroid  bed within normal limits to palpation.  Parotid glands and submandibular glands  equal bilaterally without mass.  Trachea is midline. Neuro:  CN 2-12 grossly intact. Gait normal. Vestibular: No nystagmus at any point of gaze. The cerebellar examination is unremarkable.   Her hearing test shows stable bilateral sensorineural hearing loss, significantly worse on the right side.  Assessment: 1.  The patient's right ear Mnire's disease is currently under control with low-salt diet and Dyazide  diuretic. 2.  Her ear canals, tympanic membranes, and middle ear spaces are normal.  No middle ear effusion or infection is noted. 3.  Stable bilateral sensorineural hearing loss, significantly worse on the right side.  Plan: 1.  The physical exam findings are reviewed with the patient. 2.  Continue with the 1500 mg low-salt diet and Dyazide  diuretic. 3.  Continue the use of her CROS hearing aids. 4.  The pathophysiology of Mnire's disease is discussed.   5.  The patient will return for reevaluation in 6 months.

## 2024-05-21 NOTE — Progress Notes (Signed)
  [address], [address] [place], [place] [address] [phone]  Audiological Evaluation    Name: Holly Richard     DOB:   [birth year]      MRN:   [id number]                                                                                     Service Date: 05/21/2024     Accompanied by: unaccompanied   Patient comes today after Dr. Karis, ENT sent a referral for a hearing evaluation due to concerns with hearing loss.   Symptoms Yes Details  Hearing loss  [x]  Difficult for patient to say if there has been a decline in hearing since last year.   Right ear- Severe to profound sensorineural hearing loss from 125 Hz - 8000 Hz.  Left ear-  Normal sloping to profound sensorineural hearing loss from 125 Hz - 8000 Hz.    Tinnitus  []    Ear pain/ infections/pressure  []    Balance problems  [x]  Patient is diagnosed with right Meniere's; no recent balance issues  Noise exposure history  []    Previous ear surgeries  [x]  Patient has  a right cochlear implant that may have been implanted around the 90's by Dr. Karis- not using it  Family history of hearing loss  []    Amplification  [x]  Currently patient uses a set of BiCROS silk hearing aids that were fit at at [facility].  Other  []      Otoscopy: Right ear: Clear external ear canal and notable landmarks visualized on the tympanic membrane. Left ear:  Clear external ear canal and notable landmarks visualized on the tympanic membrane.  Tympanometry: Right ear: Normal external ear canal volume with normal middle ear pressure and tympanic membrane compliance (Type A). Findings are suggestive of normal middle ear function. Left ear: Normal external ear canal volume with normal middle ear pressure and tympanic membrane compliance (Type A). Findings are suggestive of normal middle ear function.    Hearing Evaluation The hearing test results were completed under headphones and results are deemed to be of good reliability. Test technique:   conventional    Pure tone Audiometry: Right ear- Severe to profound sensorineural hearing loss from 125 Hz - 8000 Hz. Left ear-  Normal sloping to profound sensorineural hearing loss from [phone] Hz.  Speech Audiometry: Right ear- Speech Awareness Threshold (SAT) was observed at 70 dBHL, with contralateral masking Left ear-Speech Reception Threshold (SRT) was obtained at 35 dBHL.   Word Recognition Score Tested using NU-6 (recorded) Right ear: Could not test due to degree of hearing loss. Left ear: 76% was obtained at a presentation level of 80 dBHL with contralateral masking which is deemed as  fair.   Impression: There is not a significant difference in pure-tone thresholds when compared to previous audiogram. Right hearing asymmetry continues to be observed.   Recommendations: Follow up with ENT as scheduled for today. Return for a hearing evaluation if concerns with hearing changes arise or per MD recommendation. Recommend a hearing aid check with their audiologist or hearing aid dispenser.   Clanton Emanuelson MARIE LEROUX-MARTINEZ, AUD

## 2024-05-21 NOTE — Addendum Note (Signed)
 Addended byBETHA KARIS CLUNES on: 05/21/2024 08:20 PM   Modules accepted: Orders

## 2024-06-09 ENCOUNTER — Encounter: Payer: Self-pay | Admitting: Audiology

## 2024-06-21 ENCOUNTER — Other Ambulatory Visit: Payer: Self-pay | Admitting: Internal Medicine

## 2024-11-19 ENCOUNTER — Ambulatory Visit (INDEPENDENT_AMBULATORY_CARE_PROVIDER_SITE_OTHER): Admitting: Otolaryngology

## 2025-04-15 ENCOUNTER — Ambulatory Visit

## 2025-04-15 ENCOUNTER — Encounter: Admitting: Internal Medicine
